# Patient Record
Sex: Male | Born: 1950 | Race: White | Hispanic: No | Marital: Married | Smoking: Never smoker
Health system: Southern US, Community
[De-identification: ages and names within clinical notes are randomized; demographics above are authoritative.]

## PROBLEM LIST (undated history)

## (undated) DIAGNOSIS — I428 Other cardiomyopathies: Secondary | ICD-10-CM

## (undated) DIAGNOSIS — I5042 Chronic combined systolic (congestive) and diastolic (congestive) heart failure: Secondary | ICD-10-CM

## (undated) DIAGNOSIS — E1140 Type 2 diabetes mellitus with diabetic neuropathy, unspecified: Secondary | ICD-10-CM

## (undated) DIAGNOSIS — I509 Heart failure, unspecified: Secondary | ICD-10-CM

## (undated) DIAGNOSIS — I10 Essential (primary) hypertension: Secondary | ICD-10-CM

## (undated) DIAGNOSIS — R42 Dizziness and giddiness: Secondary | ICD-10-CM

## (undated) HISTORY — DX: Dizziness and giddiness: R42

## (undated) HISTORY — DX: Other cardiomyopathies: I42.8

## (undated) HISTORY — PX: CORONARY ARTERY BYPASS GRAFT: SHX141

## (undated) HISTORY — DX: Chronic combined systolic (congestive) and diastolic (congestive) heart failure: I50.42

## (undated) HISTORY — DX: Type 2 diabetes mellitus with diabetic neuropathy, unspecified: E11.40

## (undated) HISTORY — PX: NM MYOCAR PERF EJECTION FRACTION: HXRAD630

---

## 1998-05-06 ENCOUNTER — Ambulatory Visit (HOSPITAL_COMMUNITY): Admission: RE | Admit: 1998-05-06 | Discharge: 1998-05-06 | Payer: Self-pay | Admitting: Gastroenterology

## 1998-12-30 ENCOUNTER — Inpatient Hospital Stay (HOSPITAL_COMMUNITY): Admission: AD | Admit: 1998-12-30 | Discharge: 1998-12-31 | Payer: Self-pay | Admitting: Pediatrics

## 1998-12-30 ENCOUNTER — Encounter: Payer: Self-pay | Admitting: Pediatrics

## 1998-12-31 ENCOUNTER — Encounter: Payer: Self-pay | Admitting: Cardiology

## 1998-12-31 HISTORY — PX: CARDIAC CATHETERIZATION: SHX172

## 1999-09-22 ENCOUNTER — Encounter: Payer: Self-pay | Admitting: Surgery

## 1999-09-22 ENCOUNTER — Encounter: Admission: RE | Admit: 1999-09-22 | Discharge: 1999-09-22 | Payer: Self-pay | Admitting: Surgery

## 1999-12-09 ENCOUNTER — Encounter: Admission: RE | Admit: 1999-12-09 | Discharge: 1999-12-09 | Payer: Self-pay | Admitting: Family Medicine

## 1999-12-09 ENCOUNTER — Encounter: Payer: Self-pay | Admitting: Family Medicine

## 2000-01-08 ENCOUNTER — Ambulatory Visit (HOSPITAL_COMMUNITY): Admission: RE | Admit: 2000-01-08 | Discharge: 2000-01-08 | Payer: Self-pay | Admitting: Gastroenterology

## 2000-03-14 ENCOUNTER — Encounter: Admission: RE | Admit: 2000-03-14 | Discharge: 2000-03-14 | Payer: Self-pay | Admitting: Otolaryngology

## 2000-03-14 ENCOUNTER — Encounter: Payer: Self-pay | Admitting: Otolaryngology

## 2002-10-19 ENCOUNTER — Encounter: Admission: RE | Admit: 2002-10-19 | Discharge: 2002-10-19 | Payer: Self-pay | Admitting: Family Medicine

## 2002-10-19 ENCOUNTER — Encounter: Payer: Self-pay | Admitting: Family Medicine

## 2003-02-18 ENCOUNTER — Ambulatory Visit (HOSPITAL_COMMUNITY): Admission: RE | Admit: 2003-02-18 | Discharge: 2003-02-18 | Payer: Self-pay | Admitting: Gastroenterology

## 2003-02-21 ENCOUNTER — Encounter: Admission: RE | Admit: 2003-02-21 | Discharge: 2003-02-21 | Payer: Self-pay | Admitting: Gastroenterology

## 2003-08-22 ENCOUNTER — Encounter: Payer: Self-pay | Admitting: Internal Medicine

## 2003-08-22 ENCOUNTER — Ambulatory Visit (HOSPITAL_COMMUNITY): Admission: RE | Admit: 2003-08-22 | Discharge: 2003-08-22 | Payer: Self-pay | Admitting: Cardiology

## 2004-06-19 ENCOUNTER — Encounter: Admission: RE | Admit: 2004-06-19 | Discharge: 2004-06-19 | Payer: Self-pay | Admitting: *Deleted

## 2004-06-25 ENCOUNTER — Ambulatory Visit (HOSPITAL_COMMUNITY): Admission: RE | Admit: 2004-06-25 | Discharge: 2004-06-26 | Payer: Self-pay | Admitting: *Deleted

## 2004-10-01 ENCOUNTER — Encounter: Admission: RE | Admit: 2004-10-01 | Discharge: 2004-10-01 | Payer: Self-pay | Admitting: Orthopedic Surgery

## 2007-04-13 ENCOUNTER — Encounter: Admission: RE | Admit: 2007-04-13 | Discharge: 2007-04-13 | Payer: Self-pay | Admitting: Family Medicine

## 2007-09-22 ENCOUNTER — Encounter: Admission: RE | Admit: 2007-09-22 | Discharge: 2007-09-22 | Payer: Self-pay | Admitting: Family Medicine

## 2007-09-22 ENCOUNTER — Encounter: Payer: Self-pay | Admitting: Pulmonary Disease

## 2007-10-25 ENCOUNTER — Ambulatory Visit: Payer: Self-pay | Admitting: Pulmonary Disease

## 2007-10-25 DIAGNOSIS — R05 Cough: Secondary | ICD-10-CM

## 2007-10-25 DIAGNOSIS — E785 Hyperlipidemia, unspecified: Secondary | ICD-10-CM | POA: Insufficient documentation

## 2007-11-15 ENCOUNTER — Telehealth (INDEPENDENT_AMBULATORY_CARE_PROVIDER_SITE_OTHER): Payer: Self-pay | Admitting: *Deleted

## 2007-11-15 ENCOUNTER — Ambulatory Visit: Payer: Self-pay | Admitting: Pulmonary Disease

## 2007-11-16 ENCOUNTER — Ambulatory Visit: Payer: Self-pay | Admitting: Internal Medicine

## 2007-11-20 ENCOUNTER — Telehealth (INDEPENDENT_AMBULATORY_CARE_PROVIDER_SITE_OTHER): Payer: Self-pay | Admitting: *Deleted

## 2007-12-12 ENCOUNTER — Telehealth: Payer: Self-pay | Admitting: Pulmonary Disease

## 2007-12-14 ENCOUNTER — Encounter: Payer: Self-pay | Admitting: Pulmonary Disease

## 2008-01-30 ENCOUNTER — Telehealth (INDEPENDENT_AMBULATORY_CARE_PROVIDER_SITE_OTHER): Payer: Self-pay | Admitting: *Deleted

## 2008-01-30 ENCOUNTER — Encounter: Payer: Self-pay | Admitting: Pulmonary Disease

## 2009-04-03 ENCOUNTER — Encounter: Payer: Self-pay | Admitting: Internal Medicine

## 2009-05-23 ENCOUNTER — Encounter: Payer: Self-pay | Admitting: Internal Medicine

## 2009-07-08 ENCOUNTER — Ambulatory Visit: Payer: Self-pay | Admitting: Internal Medicine

## 2009-07-09 ENCOUNTER — Ambulatory Visit: Payer: Self-pay | Admitting: Internal Medicine

## 2009-07-09 ENCOUNTER — Ambulatory Visit (HOSPITAL_COMMUNITY): Admission: RE | Admit: 2009-07-09 | Discharge: 2009-07-09 | Payer: Self-pay | Admitting: Internal Medicine

## 2009-07-11 ENCOUNTER — Telehealth: Payer: Self-pay | Admitting: Internal Medicine

## 2009-07-14 ENCOUNTER — Encounter: Payer: Self-pay | Admitting: Internal Medicine

## 2009-07-16 ENCOUNTER — Ambulatory Visit: Payer: Self-pay | Admitting: Internal Medicine

## 2009-07-16 DIAGNOSIS — I509 Heart failure, unspecified: Secondary | ICD-10-CM | POA: Insufficient documentation

## 2009-07-16 DIAGNOSIS — I428 Other cardiomyopathies: Secondary | ICD-10-CM | POA: Insufficient documentation

## 2009-07-21 LAB — CONVERTED CEMR LAB
BUN: 15 mg/dL (ref 6–23)
Basophils Absolute: 0 10*3/uL (ref 0.0–0.1)
CO2: 28 meq/L (ref 19–32)
Calcium: 8.9 mg/dL (ref 8.4–10.5)
Creatinine, Ser: 1.3 mg/dL (ref 0.4–1.5)
Eosinophils Absolute: 0.2 10*3/uL (ref 0.0–0.7)
Eosinophils Relative: 4.8 % (ref 0.0–5.0)
GFR calc non Af Amer: 59.85 mL/min (ref >60)
HCT: 43.3 % (ref 39.0–52.0)
INR: 1 (ref 0.8–1.0)
Lymphocytes Relative: 26.3 % (ref 12.0–46.0)
MCHC: 35.2 g/dL (ref 30.0–36.0)
MCV: 85.7 fL (ref 78.0–100.0)
Monocytes Relative: 7.5 % (ref 3.0–12.0)
Neutro Abs: 3 10*3/uL (ref 1.4–7.7)
Sodium: 140 meq/L (ref 135–145)
aPTT: 30.3 s — ABNORMAL HIGH (ref 21.7–28.8)

## 2009-07-23 ENCOUNTER — Ambulatory Visit: Payer: Self-pay | Admitting: Internal Medicine

## 2009-07-23 ENCOUNTER — Ambulatory Visit (HOSPITAL_COMMUNITY): Admission: RE | Admit: 2009-07-23 | Discharge: 2009-07-24 | Payer: Self-pay | Admitting: Internal Medicine

## 2009-07-24 ENCOUNTER — Encounter: Payer: Self-pay | Admitting: Internal Medicine

## 2009-08-06 ENCOUNTER — Ambulatory Visit: Payer: Self-pay

## 2009-08-06 ENCOUNTER — Encounter: Payer: Self-pay | Admitting: Internal Medicine

## 2009-10-02 ENCOUNTER — Ambulatory Visit: Payer: Self-pay | Admitting: Internal Medicine

## 2009-11-18 ENCOUNTER — Ambulatory Visit: Payer: Self-pay | Admitting: Internal Medicine

## 2009-11-18 DIAGNOSIS — R5383 Other fatigue: Secondary | ICD-10-CM

## 2009-11-18 DIAGNOSIS — R5381 Other malaise: Secondary | ICD-10-CM

## 2010-04-02 ENCOUNTER — Encounter (INDEPENDENT_AMBULATORY_CARE_PROVIDER_SITE_OTHER): Payer: Self-pay | Admitting: *Deleted

## 2010-04-16 ENCOUNTER — Encounter: Payer: Self-pay | Admitting: Pulmonary Disease

## 2010-05-05 NOTE — Letter (Signed)
Summary: Cath Report 2005  Cath Report 2005   Imported By: Roderic Ovens 07/24/2009 15:31:58  _____________________________________________________________________  External Attachment:    Type:   Image     Comment:   External Document

## 2010-05-05 NOTE — Assessment & Plan Note (Signed)
Summary: nep/biv-icd/generator change/jml   Referring Provider:  Donia Guiles Primary Provider:  Arvilla Market  CC:  nep/biv-icd- generator change.  History of Present Illness: Mr. Russell Fuentes is seen at the request of Dr. Alanda Amass for consideration of CRT he generator replacement.  The patient is a just about 60 year old gentleman with a history of nonischemic heart disease by catheterization in 2005 and a negative recent Myoview had congestive heart failure. He underwent CRT-D implantation in 2006at that time received a 6948 Sprint fidelis lead. He has had no subsequent ICD discharges.  There has been suffering normalization of his left ventricular function. Recent ejection fractions are in the 50-55% range. Not withstanding this he had some problems with exercise intolerance but no nocturnal dyspnea orthopnea or peripheral edema.  He has had no ICD discharges.  The concern about a heart attack about 30 years ago. He developed at that time left bundle branch block associated temporally with prolonged chest pain. This was not confirmed at catheterization. Myoview scanning demonstrated normal perfusion  Current Medications (verified): 1)  Coreg 25 Mg  Tabs (Carvedilol) .[name].. 1 Tab Two Times A Day 2)  Diovan 160 Mg  Tabs (Valsartan) .... Take 1 Tablet By Mouth Once A Day 3)  Spironolactone 25 Mg Tabs (Spironolactone) .... 1/2 Tab Daily 4)  Klor-Con M10 10 Meq  Cr-Tabs (Potassium Chloride Crys Cr) .... Pt Not Taking 5)  Glimepiride 4 Mg  Tabs (Glimepiride) .... Once Daily 6)  Lovastatin 20 Mg Tabs (Lovastatin) .... Take One Tablet Once Daily  Allergies (verified): 1)  ! Codeine  Past History:  Past Medical History: Last updated: 07/07/2009 Diabetes Hyperlipidemia Heart disease of unknown type status post permanent pacemaker-Medtronic model number 4193  Past Surgical History: Last updated: 07/07/2009 Medtronic model number 4193  Family History: Last updated:  10/25/2007 asthma-sisters, brothers heart disease-father,sister cancer-mother-colon, sister-lung family has diabetes  Social History: Last updated: 10/25/2007 works as Designer, industrial/product lives with wife and dog  Vital Signs:  Patient profile:   60 year old male Height:      71 inches Weight:      276 pounds BMI:     38.63 Pulse rate:   77 / minute Pulse rhythm:   regular BP sitting:   136 / 80  (right arm) Cuff size:   large  Vitals Entered By: Judithe Modest CMA (July 08, 2009 11:19 AM)  Physical Exam  General:  Well developed, well nourished,occasion male appearing his stated age in no acute distress.moderately obese Head:  normal HEENT Neck:  supple without adenopathy JVP or carotid bruits Lungs:  clear Heart:  regular rate and rhythm without murmurs or gallops Abdomen:  soft protuberant active bowel sounds positive HJR Msk:  Back normal, normal gait. Muscle strength and tone normal. Pulses:  intact distal pulses Extremities:  No clubbing or cyanosis. Neurologic:  Alert and oriented x 3. Skin:  Intact without lesions or rashes. Cervical Nodes:  no adenopathy Psych:  anxious   EKG  Procedure date:  07/08/2009  Findings:      P synchronouss biventricular pacing  Impression & Recommendations:  Problem # 1:  IMPLANTATION OF DEFIBRILLATOR- MEDTRONIC MODEL NUMBER 4193 (ICD-V45.02) the patient has a previously implanted CRT-D which a achieved ERI December 2010. He is referred for consideration of device generator replacement. The system is in conjunction with a 6948-lead. Her graft with a lengthy discussion regarding options which relates to the presence or absence of venous occlusion on the ipsilateral side. In the event that it is  occluded, options would be extraction of the l or tunneling of a contralaterally inserted defibrillator lead. The knot of he will be necessary to determine whether this is necessary. We discussed potential benefits as well as potential risks and  consultations of procedure including infection lead fracture in the standing is risks and are willing to proceed.  Outpatient records were reviewed demonstrating a nonischemic Myoview and normalization of left ventricular function.  Patient Instructions: 1)  You are scheduled for a venogram in the morning (July 09, 2009).  Be at the short stay center at [facility] at 6:30AM.  Nothing to eat or drink after midnight tonight.

## 2010-05-05 NOTE — Miscellaneous (Signed)
Summary: Device change out  Clinical Lists Changes  Observations: Added new observation of ICD INDICATN: NICM/CHF (07/24/2009 13:29) Added new observation of ICDLEADSTAT3: active (07/24/2009 13:29) Added new observation of ICDLEADSER3: [id number] V (07/24/2009 13:29) Added new observation of ICDLEADMOD3: 4193  (07/24/2009 13:29) Added new observation of ICDLEADDOI3: 06/25/2004  (07/24/2009 13:29) Added new observation of ICDLEADLOC3: LV  (07/24/2009 13:29) Added new observation of ICDLEADSTAT2: active  (07/24/2009 13:29) Added new observation of ICDLEADSER2: [id number]  (07/24/2009 13:29) Added new observation of ICDLEADMOD2: 7121  (07/24/2009 13:29) Added new observation of ICDLEADDOI2: 07/23/2009  (07/24/2009 13:29) Added new observation of ICDLEADLOC2: RV  (07/24/2009 13:29) Added new observation of ICDLEADSTAT1: active  (07/24/2009 13:29) Added new observation of ICDLEADSER1: [id number]  (07/24/2009 13:29) Added new observation of ICDLEADMOD1: 5076  (07/24/2009 13:29) Added new observation of ICDLEADDOI1: 06/25/2004  (07/24/2009 13:29) Added new observation of ICDLEADLOC1: RA  (07/24/2009 13:29) Added new observation of ICD IMP MD: Sherryl Manges, MD  (07/24/2009 13:29) Added new observation of ICD IMPL DTE: 07/23/2009  (07/24/2009 13:29) Added new observation of ICD SERL#: [id number] H  (07/24/2009 13:29) Added new observation of ICD MODL#: [id number]  (07/24/2009 13:29) Added new observation of ICDMANUFACTR: Medtronic  (07/24/2009 13:29) Added new observation of IDC REFER MD: Hazeline Junker  (07/24/2009 13:29) Added new observation of ICD MD: Sherryl Manges, MD  (07/24/2009 13:29)       ICD Specifications Following MD:  Sherryl Manges, MD     Referring MD:  Hazeline Junker ICD Vendor:  Medtronic     ICD Model Number:  [id number]     ICD Serial Number:  [id number] ICD DOI:  07/23/2009     ICD Implanting MD:  Sherryl Manges, MD  Lead 1:    Location: RA     DOI: 06/25/2004     Model  #: 2595     Serial #: [id number]     Status: active Lead 2:    Location: RV     DOI: 07/23/2009     Model #: 9518     Serial #: [id number]     Status: active Lead 3:    Location: LV     DOI: 06/25/2004     Model #: 0160     Serial #: [id number]     Status: active  Indications::  NICM/CHF

## 2010-05-05 NOTE — Letter (Signed)
Summary: [facility] Office Note  [facility] Office Note   Imported By: Roderic Ovens 07/24/2009 15:35:26  _____________________________________________________________________  External Attachment:    Type:   Image     Comment:   External Document

## 2010-05-05 NOTE — Cardiovascular Report (Signed)
Summary: Pre Op Orders  Pre Op Orders   Imported By: Roderic Ovens 07/24/2009 11:26:11  _____________________________________________________________________  External Attachment:    Type:   Image     Comment:   External Document

## 2010-05-05 NOTE — Cardiovascular Report (Signed)
Summary: Office Visit   Office Visit   Imported By: Roderic Ovens 08/18/2009 16:41:34  _____________________________________________________________________  External Attachment:    Type:   Image     Comment:   External Document

## 2010-05-05 NOTE — Progress Notes (Signed)
Summary: req call back / surgery 4/20  Phone Note Call from Patient Call back at Home Phone [phone]   Caller: Patient Reason for Call: Talk to Nurse Summary of Call: request call back from nurse, please call before 1:30 Initial call taken by: Migdalia Dk,  July 11, 2009 9:46 AM  Follow-up for Phone Call        Spoke with pt. Pt states he had a Venogram on april 6th /11. Patient states per Dr. Marikay Alar:  Joice Lofts was to call him to comfirm he was scheduled for device generator replacement on wednesday April 20th at 6:30 AM. Patient would like to know asap so he can let his supervisor know. I let pt. know will send message to MD and his nurse. Ollen Gross, RN, BSN  July 11, 2009 12:35 PM  Per pt calling back to check on status sugery 4/20. [phone] Lorne Skeens  July 14, 2009 12:13 PM    Additional Follow-up for Phone Call Additional follow up Details #1::        Called patient and left message on machine [employer] RN BSN  July 14, 2009 1:36 PM  Spoke with wife, instructions reviewed. Gypsy Balsam RN BSN  July 14, 2009 4:33 PM

## 2010-05-05 NOTE — Procedures (Signed)
Summary: [facility]/ GD   Current Medications (verified): 1)  Coreg 25 Mg  Tabs (Carvedilol) .[name].. 1 Tab Two Times A Day 2)  Diovan 160 Mg  Tabs (Valsartan) .... Take 1 Tablet By Mouth Once A Day 3)  Spironolactone 25 Mg Tabs (Spironolactone) .... 1/2 Tab Daily 4)  Klor-Con M10 10 Meq  Cr-Tabs (Potassium Chloride Crys Cr) .... Pt Not Taking 5)  Glimepiride 4 Mg  Tabs (Glimepiride) .... Once Daily 6)  Lovastatin 20 Mg Tabs (Lovastatin) .... Take One Tablet Once Daily  Allergies (verified): 1)  ! Codeine   ICD Specifications Following MD:  Sherryl Manges, MD     Referring MD:  Hazeline Junker ICD Vendor:  Medtronic     ICD Model Number:  [id number]     ICD Serial Number:  [id number] ICD DOI:  07/23/2009     ICD Implanting MD:  Sherryl Manges, MD  Lead 1:    Location: RA     DOI: 06/25/2004     Model #: 6213     Serial #: [id number]     Status: active Lead 2:    Location: RV     DOI: 07/23/2009     Model #: 2952     Serial #: [id number]     Status: active Lead 3:    Location: LV     DOI: 06/25/2004     Model #: 1027     Serial #: [id number]     Status: active  Indications::  NICM/CHF   ICD Follow Up Remote Check?  No Battery Voltage:  3.22 V     Charge Time:  8.5 seconds     ICD Dependent:  No       ICD Device Measurements Atrium:  Amplitude: 3.8 mV, Impedance: 551 ohms, Threshold: 0.5 V at 0.4 msec Right Ventricle:  Amplitude: 20 mV, Impedance: 551 ohms, Threshold: 0.75 V at 0.4 msec Left Ventricle:  Impedance: 703 ohms, Threshold: 0.875 V at 0.4 msec Shock Impedance: 51/65 ohms   Episodes MS Episodes:  0     Percent Mode Switch:  0     Coumadin:  No Shock:  0     ATP:  0     Nonsustained:  0     Atrial Pacing:  29.4%     Ventricular Pacing:  99.9%  Brady Parameters Mode DDD     Lower Rate Limit:  70     Upper Rate Limit 120 PAV 130     Sensed AV Delay:  100  Tachy Zones VF:  188     Next Cardiology Appt Due:  11/03/2009 Tech Comments:  Steri strips removed. No redness or edema.   Activity restrictions reviewed with the patient along with instructions for icd discharge.  No parameter changes.  Device function normal.  ROV 3 months with Dr. Graciela Husbands. Altha Harm, LPN  Aug 06, 4740 12:45 PM    Patient Instructions: 1)  Your physician recommends that you continue on your current medications as directed. Please refer to the Current Medication list given to you today. Return office visit in 3 months.

## 2010-05-05 NOTE — Cardiovascular Report (Signed)
Summary: Office Visit   Office Visit   Imported By: Roderic Ovens 11/19/2009 11:59:50  _____________________________________________________________________  External Attachment:    Type:   Image     Comment:   External Document

## 2010-05-05 NOTE — Cardiovascular Report (Signed)
Summary: Office Visit   Office Visit   Imported By: Roderic Ovens 10/29/2009 16:12:35  _____________________________________________________________________  External Attachment:    Type:   Image     Comment:   External Document

## 2010-05-05 NOTE — Assessment & Plan Note (Signed)
Summary: icd went off per pt   Current Medications (verified): 1)  Coreg 25 Mg  Tabs (Carvedilol) .[name].. 1 Tab Two Times A Day 2)  Diovan 160 Mg  Tabs (Valsartan) .... Take 1 Tablet By Mouth Once A Day 3)  Spironolactone 25 Mg Tabs (Spironolactone) .... 1/2 Tab Daily 4)  Klor-Con M10 10 Meq  Cr-Tabs (Potassium Chloride Crys Cr) .... Pt Not Taking 5)  Glimepiride 4 Mg  Tabs (Glimepiride) .... Once Daily 6)  Lovastatin 20 Mg Tabs (Lovastatin) .... Take One Tablet Once Daily  Allergies (verified): 1)  ! Codeine    ICD Specifications Following MD:  Sherryl Manges, MD     Referring MD:  Hazeline Junker ICD Vendor:  Medtronic     ICD Model Number:  [id number]     ICD Serial Number:  [id number] ICD DOI:  07/23/2009     ICD Implanting MD:  Sherryl Manges, MD  Lead 1:    Location: RA     DOI: 06/25/2004     Model #: 5076     Serial #: [id number]     Status: active Lead 2:    Location: RV     DOI: 07/23/2009     Model #: 4132     Serial #: [id number]     Status: active Lead 3:    Location: LV     DOI: 06/25/2004     Model #: 5366     Serial #: [id number]     Status: active  Indications::  NICM/CHF   ICD Follow Up Battery Voltage:  3.21 V     Charge Time:  8.5 seconds     ICD Dependent:  No       ICD Device Measurements Atrium:  Impedance: 494 ohms,  Right Ventricle:  Impedance: 627 ohms,  Left Ventricle:  Impedance: 627 ohms,  Shock Impedance: 51/66 ohms   Episodes MS Episodes:  0     Percent Mode Switch:  0     Coumadin:  No Shock:  0     ATP:  0     Nonsustained:  1     Atrial Therapies:  0 Atrial Pacing:  32%     Ventricular Pacing:  32.0%  Brady Parameters Mode DDD     Lower Rate Limit:  70     Upper Rate Limit 120 PAV 130     Sensed AV Delay:  100  Tachy Zones VF:  188     Tech Comments:  Pt woke up Monday morning around 3am with a shock feeling.  Pt thought icd fired. 1 NST episode lasting 2 seconds on 09-05-09.  No changes made.  ROV 11-18-09 @ 920 w/SK. Vella Kohler  October 02, 2009 11:37 AM

## 2010-05-05 NOTE — Assessment & Plan Note (Signed)
Summary: device/saf   Referring Provider:  Donia Guiles Primary Provider:  Arvilla Market   History of Present Illness: Russell Fuentes is seen in followup for CRT D generator replacement.  He underwent CRT-D implantation in 2006at that time received a 6948 Sprint fidelis lead. He has had no subsequent ICD discharges.  He underwent a generator replacement and a new defibrillator lead was implanted at the same time because of the risk of fracture of the patella system.  He has a  history of nonischemic heart disease by catheterization in 2005 and a negative recent Myoview had congestive heart failure.  There has been subsequent normalization  of his left ventricular function. Recent ejection fractions are in the 50-55% range.    He has had no ICD discharges.  The patient is very frustrated about the lack of followup with Dr. Kandis Cocking office not calling afterwards.  I told him that I have never done so, but maybe i should  Current Medications (verified): 1)  Coreg 25 Mg  Tabs (Carvedilol) .[name].. 1 Tab Two Times A Day 2)  Diovan 160 Mg  Tabs (Valsartan) .... Take 1 Tablet By Mouth Once A Day 3)  Spironolactone 25 Mg Tabs (Spironolactone) .... 1/2 Tab Daily 4)  Klor-Con M10 10 Meq  Cr-Tabs (Potassium Chloride Crys Cr) .... As Needed 5)  Glimepiride 4 Mg  Tabs (Glimepiride) .... Once Daily 6)  Lovastatin 20 Mg Tabs (Lovastatin) .... Take One Tablet Once Daily  Allergies (verified): 1)  ! Codeine  Past History:  Past Medical History: Last updated: 07/10/2009 Diabetes Hyperlipidemia Heart disease of unknown type status post permanent pacemaker-Medtronic model number 4193 MI 1984  Vital Signs:  Patient profile:   60 year old male Height:      71 inches Weight:      272 pounds BMI:     38.07 Pulse rate:   87 / minute Resp:     18 per minute BP sitting:   140 / 84  (right arm)  Vitals Entered By: Marrion Coy, CNA (November 18, 2009 9:19 AM)  Physical Exam  General:  The patient  was alert and oriented in no acute distress. HEENT Normal.  Neck veins were flat, carotids were brisk.  Lungs were clear.  Heart sounds were regular without murmurs or gallops.  Abdomen was soft with active bowel sounds. There is no clubbing cyanosis or edema. Skin Warm and dry     ICD Specifications Following MD:  Sherryl Manges, MD     Referring MD:  Hazeline Junker ICD Vendor:  Medtronic     ICD Model Number:  [phone]     ICD Serial Number:  [id number] ICD DOI:  07/23/2009     ICD Implanting MD:  Sherryl Manges, MD  Lead 1:    Location: RA     DOI: 06/25/2004     Model #: 7846     Serial #: [id number]     Status: active Lead 2:    Location: RV     DOI: 07/23/2009     Model #: 2440     Serial #: [id number]     Status: active Lead 3:    Location: LV     DOI: 06/25/2004     Model #: 6440     Serial #: [id number]     Status: active  Indications::  NICM/CHF   ICD Follow Up Battery Voltage:  3.20 V     Charge Time:  8.5 seconds     Underlying rhythm:  SR ICD Dependent:  No       ICD Device Measurements Atrium:  Amplitude: 4.8 mV, Impedance: 532 ohms, Threshold: 0.50 V at 0.40 msec Right Ventricle:  Amplitude: >20 mV, Impedance: 551 ohms, Threshold: 1.25 V at 0.40 msec Left Ventricle:  Impedance: 532 ohms, Threshold: 1.00 V at 0.40 msec Shock Impedance: 53/72 ohms   Episodes MS Episodes:  0     Coumadin:  No Shock:  0     ATP:  0     Nonsustained:  0     Atrial Therapies:  0 Atrial Pacing:  35%     Ventricular Pacing:  99.9%  Brady Parameters Mode DDD     Lower Rate Limit:  70     Upper Rate Limit 120 PAV 130     Sensed AV Delay:  100  Tachy Zones VF:  188     Next Cardiology Appt Due:  02/03/2010 Tech Comments:  NORMAL DEVICE FUNCTION.  NO EPISODES SINCE LAST CHECK.  CHANGED RA OUTPUT TO 2.0 RV OUTPUT TO 2.50 V.  CHANGED MAX LEAD IMPEDANCES FOR LIA.  TURNED ON 1:1 SVT.  PT WANTS TO THINK ABOUT CARELINK--WILL MAKE DECISION AT NEXT OFC CHECK.  ROV IN 3 MTHS W/CLINIC. Vella Kohler  November 18, 2009 9:30 AM  Impression & Recommendations:  Problem # 1:  [id number] LEAD (ICD-996.04) The patient's sprint fidelis lead was capped and the new ICD lead implant  Problem # 2:  CRT DEFIBRILLATOR- MEDTRONIC (ICD-V45.02) Device parameters and data were reviewed and no changes were made note the above system revision  Problem # 3:  CHF (ICD-428.0) stable His updated medication list for this problem includes:    Coreg 25 Mg Tabs (Carvedilol) .[name]... 1 tab two times a day    Diovan 160 Mg Tabs (Valsartan) .[name]... Take 1 tablet by mouth once a day    Spironolactone 25 Mg Tabs (Spironolactone) .[name]... 1/2 tab daily  Problem # 4:  OTHER PRIMARY CARDIOMYOPATHIES (ICD-425.4) continue current medications His updated medication list for this problem includes:    Coreg 25 Mg Tabs (Carvedilol) .[name]... 1 tab two times a day    Diovan 160 Mg Tabs (Valsartan) .[name]... Take 1 tablet by mouth once a day    Spironolactone 25 Mg Tabs (Spironolactone) .[name]... 1/2 tab daily  Problem # 5:  FATIGUE (ICD-780.79) the patient has disordered eating at night suggestive of obstructive sleep apnea. I think he should undergo night watch testing, but will defer this to his primary cardiologist. In the event that the patient returns for care here we will pursue this  Patient Instructions: 1)  Your physician recommends that you schedule a follow-up appointment in: 3 MOMNTHS WITH KRSITIN AND PAULA 2)  Your physician recommends that you continue on your current medications as directed. Please refer to the Current Medication list given to you today.

## 2010-05-05 NOTE — Letter (Signed)
Summary: Implantable Device Instructions  Architectural technologist, Main Office  [address]   [place], [place] [address]   Phone: [phone]  Fax: [phone]      Implantable Device Instructions  You are scheduled for:  _____ Permanent Transvenous Pacemaker _____ Implantable Cardioverter Defibrillator _____ Implantable Loop Recorder __X___ Generator Change with new RV lead  on [facility] July 23, 2009 with Dr. Graciela Husbands.  1.  Please arrive at the Short Stay Center at [facility] at 6:30 AM on the day of your procedure.  2.  Do not eat or drink the night before your procedure.  3.  Complete lab work on 07-16-09.  The lab at [facility] is open from 8:30 AM to 1:30 PM and from 2:30 PM to 5:00 PM.  The lab at [facility] is open from 7:30 AM to 5:30 PM.  You do not have to be fasting.  4.  Do NOT take these medications morning of procedure: Glimiperide.   5.  Plan for an overnight stay.  Bring your insurance cards and a list of your medications.  6.  Wash your chest and neck with antibacterial soap (any brand) the evening before and the morning of your procedure.  Rinse well.   *If you have ANY questions after you get home, please call the office [phone].  *Every attempt is made to prevent procedures from being rescheduled.  Due to the nauture of Electrophysiology, rescheduling can happen.  The physician is always aware and directs the staff when this occurs.

## 2010-05-07 NOTE — Consult Note (Signed)
Summary: [facility]  [facility]   Imported By: Sherian Rein 04/28/2010 12:43:47  _____________________________________________________________________  External Attachment:    Type:   Image     Comment:   External Document

## 2010-05-07 NOTE — Letter (Signed)
Summary: Device-Delinquent Check  [facility], Main Office  [address]   [place], [place] [address]   Phone: [phone]  Fax: [phone]     April 02, 2010 MRN: [id number]   Russell Fuentes [address] [place], [place]  [address]   Dear Mr. BURLEY,  According to our records, you have not had your implanted device checked in the recommended period of time.  We are unable to determine appropriate device function without checking your device on a regular basis.  Please call our office to schedule an appointment, with Device Clinic, as soon as possible.  If you are having your device checked by another physician, please call us so that we may update our records.  Thank you,  Letta Moynahan, EMT  April 02, 2010 12:04 PM  [facility]

## 2010-05-08 NOTE — Cardiovascular Report (Signed)
Summary: [facility] Defibrillator Interrogation Form  [facility] Defibrillator Interrogation Form   Imported By: Roderic Ovens 07/24/2009 15:34:17  _____________________________________________________________________  External Attachment:    Type:   Image     Comment:   External Document

## 2010-06-23 LAB — GLUCOSE, CAPILLARY
Glucose-Capillary: 138 mg/dL — ABNORMAL HIGH (ref 70–99)
Glucose-Capillary: 172 mg/dL — ABNORMAL HIGH (ref 70–99)
Glucose-Capillary: 176 mg/dL — ABNORMAL HIGH (ref 70–99)

## 2010-08-21 NOTE — Cardiovascular Report (Signed)
Russell Fuentes, Russell Fuentes              ACCOUNT NO.:  [id number]   MEDICAL RECORD NO.:  [id number]          PATIENT TYPE:  OIB   LOCATION:  6531                         FACILITY:  [facility]   PHYSICIAN:  Mark E. Peele, M.D.    DATE OF BIRTH:  08-06-1950   DATE OF PROCEDURE:  06/25/2004  DATE OF DISCHARGE:                              CARDIAC CATHETERIZATION   PROCEDURES PERFORMED:  ICD implantation dual chamber, left axillary  venogram, fluoroscopy for implantation, coronary sinus venography,  implantation of LV pacing lead via coronary vein, lead and ICD testing.   PREPROCEDURE DIAGNOSES:  1.  Class III congestive heart failure.  2.  Nonischemic cardiomyopathy.  3.  Severe ejection fraction than 35%.  4.  Dilated left ventricle.  5.  Left bundle branch block, QRS duration greater than 130 msec.   POSTPROCEDURE DIAGNOSES:  1.  Class III congestive heart failure.  2.  Nonischemic cardiomyopathy.  3.  Severe ejection fraction than 35%.  4.  Dilated left ventricle.  5.  Left bundle branch block, QRS duration greater than 130 msec.   OPERATOR:  Mark E. Severiano Gilbert, M.D.   COMPLICATIONS:  None.   ESTIMATED BLOOD LOSS:  Less than 50 mL.   PROCEDURE IN DETAIL:  The patient was brought to the EP laboratory in fasted  state.  The patient's left pectoral region was prepped and draped in usual  manner.  Ancef 1 g IV immediately prior to skin incision for antimicrobial  prophylaxis.  Subcutaneous lidocaine injections of 1% lidocaine for  anesthesia, conscious sedation with intermittent injections of Midazolam and  Fentanyl.  A 6-cm incision line 2 cm inferior to the left clavicle and  reaching the deltopectoral groove was made to the level of the prepectoral  fascia using a combination of sharp and blunt dissection.  Inferior to the  incision line along the prepectoral fascia, an appropriately sized ICD  pocket was fashioned.  Hemostasis was obtained by electrocautery.  The  pocket was copiously  irrigated with antibiotic solution.  Next, a left  axillary venogram was performed which demonstrated a moderate sized  relatively linear axillary vein.  Using a thin wall needle technique in an  extrathoracic manner along the first rib, the axillary vein was entered  three times.  Two 7-French and a single 9.5-French sheath were introduced  without difficulty.  A Medtronic model number [id number] active fixation atrial  lead was advanced under fluoroscopic guidance into the right atrial  appendage actively fixed, and after determining adequate electronic  characteristics the safe sheath was removed and the lead was sutured to the  prepectoral fascia using 0 silk ligatures.  Next, a Medtronic model number  [id number] passive fixation ICD lead was advanced fluoroscopically first into the  right ventricular outflow tract and then positioned within the right  ventricular apex without difficulty.  After determining adequate electronic  characteristics, the safe sheath was removed and the lead was sutured in  place using 0 silk ligatures.  Finally, via the 9.5-French safe sheath a  long Medtronic curved CS cannulation sheath was advanced to the level of the  right  atrium.  Under fluoroscopic guidance, the sheath was easily advanced  into the os of the coronary sinus.  Using a balloon tip catheter, a balloon  occlusive coronary sinus venogram was performed in multiple projections.  The patient had a modest sized middle vein with a small posterior lateral  branch arising off the mid course of the middle vein.  He had a modest size  very stenotic and tortuous lateral vein, and he had large anterior vein  complex with a large branching anterolateral vein which appeared to be the  most suitable target.  Using a floppy angioplasty wire, the lateral vein was  attempted to be cannulated without success, and an angioplasty wire was used  to interrogate the great cardiac vein area.  It was relatively easy to enter   the anterolateral branch which was of extensive course all the way down to  the distal one-third of the LV and as it coursed along this became more and  more lateral.  Over the angioplasty wire, a Medtronic model number [id number]  unipolar LV pacing lead was advanced without difficulty into the distal one-  third, middle one-third of the LV in the RAO projection.  The angioplasty  wire was easily removed and after determining adequate electronic  characteristics this was selected as the pacing site for the LV.  Under  fluoroscopic guidance, the Medtronic CS sheath was removed without  difficulty in a cut-away fashion. The LV lead was then sutured in place to  the prepectoral fascia using 0 silk ligatures.  The atrial, ventricular, and  LV leads were then connected to a Medtronic model number 7304 biventricular  ICD serial number [id number] which was then placed within the preformed ICD  pocket.  The pocket was closed in layers of 2-0 and 4-0 Vicryl.  The skin  incision was reinforced with Steri-Strips.  A single ICD shock was performed  for safety margin testing and after completion of this the patient was  returned to the holding area in stable hemodynamic condition.   Electronic characteristics of the atrial lead demonstrated through the  stimulator, capture threshold 0.4 V at 0.5 msec, impedance 1285 ohms,  measured P wave 2.8.  The RV lead demonstrated a capture threshold of 0.7 V  at 0.5 msec, impedance 688 ohms, and a measured R wave of 12.  The LV lead  demonstrated a capture threshold of 0.9 V at 0.5 msec, impedance 1000 ohms,  and a measured R wave of 24 mV.  Similar values were obtained through the  device after connection with all acceptable impedances.  High energy shock  impedance was noted to be approximately 50 ohms.  Of note, the LV threshold  through the device was between 1 and 1.5 V at 0.5 msec.  There was absence of diaphragmatic pacing from all pacing sites at maximum   output.  A single episode of ventricular fibrillation was easily induced  with T wave shock protocol device in a sensitive setting adequately with  minimal dropout, recognized the arrhythmia and delivered a single 20-joule  shock which resulted in resumption of a perfusing rhythm.  This correlates  to a safety margin of greater than 10 joules.  The device was programmed  into a dual chamber pacemaker mode with AV delay of 110-130, output 5 V, 0.5  msec in all chambers.  This was to ensure biventricular pacing.  A single  zone tachy therapy for VF greater than 200 beats per minute was programmed  on.  The patient will receive initial 30 joule followed by a 35-joule shock.  35 joules is the maximum output of the device.      MEP/MEDQ  D:  06/25/2004  T:  06/25/2004  Job:  [id number]

## 2010-08-21 NOTE — Op Note (Signed)
   NAME:  Russell Fuentes, Russell Fuentes                        ACCOUNT NO.:  [id number]   MEDICAL RECORD NO.:  [id number]                   PATIENT TYPE:  AMB   LOCATION:  ENDO                                 FACILITY:  [facility]   PHYSICIAN:  James L. Malon Kindle., M.D.          DATE OF BIRTH:  Apr 29, 1950   DATE OF PROCEDURE:  02/18/2003  DATE OF DISCHARGE:                                 OPERATIVE REPORT   PROCEDURE:  Colonoscopy.   MEDICATIONS:  1. Fentanyl 50 mcg.  2. Versed 6 mg IV.   INDICATION:  This is done as a follow-up for a history of previous  adenomatous colon polyps.   DESCRIPTION OF PROCEDURE:  The procedure had been explained to the patient  and consent obtained.  With the patient in the left lateral decubitus  position, the adult Olympus scope inserted and advanced.  The prep was  excellent.  Using abdominal pressure and position changes, we were able to  advance through to the cecum.  The ileocecal valve was seen and was  __________.  The scope was withdrawn.  The cecum, ascending colon,  transverse colon, descending and sigmoid colon were seen well.  No polyps  were seen throughout the entire colon.  There was diverticulosis within the  sigmoid colon.  The scope was withdrawn.  The patient tolerated the  procedure well.   ASSESSMENT:  1. History of colon polyps with negative colonoscopy, code V12.72.  2. Diverticulosis, code 562.10.   PLAN:  We will recommend five year repeat colonoscopy and yearly Hemoccults.                                               James L. Malon Kindle., M.D.    Waldron Session  D:  02/18/2003  T:  02/18/2003  Job:  [id number]   cc:   Donia Guiles, M.D.  [address] [place]  [place] [address]  Fax: [phone]

## 2010-08-21 NOTE — Cardiovascular Report (Signed)
NAME:  Russell Fuentes, Russell Fuentes                        ACCOUNT NO.:  [id number]   MEDICAL RECORD NO.:  [id number]                   PATIENT TYPE:  OIB   LOCATION:  2899                                 FACILITY:  [facility]   PHYSICIAN:  [name], M.D.             DATE OF BIRTH:  Mar 29, 1951   DATE OF PROCEDURE:  08/22/2003  DATE OF DISCHARGE:  08/22/2003                              CARDIAC CATHETERIZATION   PROCEDURE PERFORMED:  1. Combine right and left heart cardiac catheterization.  2. Thermal dilution and Fick cardiac output determinations.   COMPLICATIONS:  None.   ENTRY SITE:  Right femoral.   DYE USED:  Omnipaque.   PATIENT PROFILE:  The patient is now a 60 year old white married gentleman  with known congestive cardiomyopathy dating back for at least 10 years. He  has had previous cardiac catheterizations; in 1984 by Dr. Aram Candela. Tysinger  and by Dr. Tresa Endo in September 2004; and had no evidence of coronary disease,  but he has had an ejection fractions measured at approximately 25-30% on  those occasions.  Recently the patient has had chest tightness and shortness  of breath, functional class 2, with exertion like bailing hay or digging in  his yard.   MEDICATIONS:  His medications include Lotensin 10 mg a day and Toprol XL 25  mg a day.  The patient is fairly noncompliant with medications and wants to  be on small doses of cheaper medicine.   Today the patient comes to the [facility] electively for  cardiac catheterization following an abnormal echocardiogram showing low EF.   RESULTS PRESSURES:  1. The left ventricular pressure was 125/5; end-diastolic pressure 17.  2. Central aortic pressure 125/80, mean of 101.  3. Right atrial mean pressure 11.  4. Right ventricular pressure 32/8; end-diastolic pressure 10.  5. Pulmonary artery pressure 30/17, mean of 22.  6. Pulmonary capillary wedge mean pressure is 17.  7. Pulmonary artery saturation 66%.  8. Central aorta saturation 95%.  9. Thermal dilution cardiac output 6.7.  10.      Thermal dilution cardiac index 2.9.  11.      Fick cardiac output 4.5.  12.      Fick cardiac index 1.9.  13.      Heart rate 74, sinus rhythm.   ANGIOGRAPHIC RESULTS:  1. The coronary arteries are normal.  Right coronary artery is large and     dominant.  Circumflex gives rise to 1 obtuse marginal branch and is     nondominant. Large intermediate ramus branch which trifurcates distally.     LAD normal with 1 diagonal branch and 1 septal perforator branch.  2. The left ventricle is dilated, globally hypokinetic.  Ejection fraction     is between 20 and 25%.  No mitral regurgitation demonstrated.   FINAL DIAGNOSES:  1. Dilated, nonischemic cardiomyopathy.  2. Left ventricular ejection fraction 20-25%.  3. Angiographic  patent coronary arteries with right coronary dominant     system.  4. Normal right heart hemodynamics.  5. Mild elevation of pulmonary capillary wedge, mean and LV end-diastolic     pressures.  6. Cardiac index by Fick and thermal appear to be somewhere between 2.0 and     2.5 which is acceptable for the patient at baseline activities with     exertion; however, he becomes functional class 2.   PLAN:  For this patient I will recommend addition of digoxin to his regimen  and I would like to see him on at least 10 mg of Altace and ultimately 50 mg  of Coreg a day.  He has indicated to me his willingness to switch from his  current beta blocker choices which are low-dose Lotensin and low-dose  metoprolol.                                               [name], M.D.    WHG/MEDQ  D:  08/22/2003  T:  08/23/2003  Job:  [id number]   cc:   [name], M.D.  [address]  [place]  [place] [address]  Fax: [phone]   [facility]   Donia Guiles, M.D.  [address] [place]  [place] [address]  Fax: [phone]

## 2010-08-21 NOTE — Procedures (Signed)
[facility]  Patient:    Russell Fuentes, Russell Fuentes                     MRN: [id number] Proc. Date: 01/08/00 Adm. Date:  [id number] Attending:  Orland Mustard                           Procedure Report  PROCEDURE PERFORMED:  Colonoscopy.  ENDOSCOPIST:  Llana Aliment. Edwards, M.D.  MEDICATIONS:  Fentanyl 62.5 mcg and Versed 6 mg IV.  SCOPE:  Pediatric Olympus video colonoscope.  INDICATIONS:  History of adenomatous colon polyps.  DESCRIPTION OF PROCEDURE:  The procedure was explained to the patient and consent obtained.  With the patient in the left lateral decubitus position, a digital exam was performed.  The pediatric Olympus video colonoscope was inserted and advanced under direct visualization.  The prep was excellent and readily advanced to the cecum without difficulty.  Using abdominal pressure and position change, the ileocecal valve was seen and for a short distance it was normal.  The appendiceal orifices were normal.  The scope was withdrawn. The cecum, ascending colon, hepatic flexure, transverse colon, splenic flexure, descending and sigmoid colon were seen upon withdrawn and no polyps or other lesions were seen.  There was no significant diverticular disease. The scope was withdrawn to the rectum and the rectum was also normal.  The patient tolerated the procedure well.  He was maintained on low flow oxygen and pulse oximetry throughout the procedure with no obvious problem.  ASSESSMENT:  No evidence of further polyps.  PLAN:  Will recommend repeating procedure in three years. DD:  01/08/00 TD:  01/09/00 Job: [id number]

## 2011-04-28 HISTORY — PX: DOPPLER ECHOCARDIOGRAPHY: SHX263

## 2012-06-29 LAB — PACEMAKER DEVICE OBSERVATION

## 2012-07-03 ENCOUNTER — Other Ambulatory Visit (HOSPITAL_COMMUNITY): Payer: Self-pay | Admitting: Cardiovascular Disease

## 2012-07-03 DIAGNOSIS — Z95810 Presence of automatic (implantable) cardiac defibrillator: Secondary | ICD-10-CM

## 2012-07-03 DIAGNOSIS — I509 Heart failure, unspecified: Secondary | ICD-10-CM

## 2012-07-03 DIAGNOSIS — I428 Other cardiomyopathies: Secondary | ICD-10-CM

## 2012-07-07 ENCOUNTER — Ambulatory Visit (HOSPITAL_COMMUNITY)
Admission: RE | Admit: 2012-07-07 | Discharge: 2012-07-07 | Disposition: A | Discharge status: Home / Self Care | Payer: BC Managed Care – PPO | Source: Ambulatory Visit | Attending: Cardiovascular Disease | Admitting: Cardiovascular Disease

## 2012-07-07 DIAGNOSIS — Z95810 Presence of automatic (implantable) cardiac defibrillator: Secondary | ICD-10-CM | POA: Insufficient documentation

## 2012-07-07 DIAGNOSIS — I428 Other cardiomyopathies: Secondary | ICD-10-CM | POA: Insufficient documentation

## 2012-07-07 DIAGNOSIS — I509 Heart failure, unspecified: Secondary | ICD-10-CM | POA: Insufficient documentation

## 2012-07-07 NOTE — Progress Notes (Signed)
[facility]   2D echo completed 07/07/2012.   Veda Canning, RDCS

## 2012-10-25 ENCOUNTER — Other Ambulatory Visit: Payer: Self-pay | Admitting: Orthopedic Surgery

## 2012-10-25 DIAGNOSIS — M545 Low back pain: Secondary | ICD-10-CM

## 2012-10-27 ENCOUNTER — Ambulatory Visit
Admission: RE | Admit: 2012-10-27 | Discharge: 2012-10-27 | Disposition: A | Discharge status: Home / Self Care | Payer: BC Managed Care – PPO | Source: Ambulatory Visit | Attending: Orthopedic Surgery | Admitting: Orthopedic Surgery

## 2012-10-27 ENCOUNTER — Other Ambulatory Visit: Payer: BC Managed Care – PPO

## 2012-10-27 DIAGNOSIS — M545 Low back pain: Secondary | ICD-10-CM

## 2012-11-01 ENCOUNTER — Other Ambulatory Visit: Payer: BC Managed Care – PPO

## 2012-12-12 ENCOUNTER — Telehealth: Payer: Self-pay | Admitting: Cardiovascular Disease

## 2012-12-12 NOTE — Telephone Encounter (Signed)
Pharmacy has faxed information on refill for diovan.  [facility] has not heard back from [place] and wife wants to know if the insurance company has been contacted.

## 2012-12-12 NOTE — Telephone Encounter (Signed)
Message forwarded to J.C. Wildman, LPN.  

## 2012-12-14 NOTE — Telephone Encounter (Signed)
Patient has called again. completely out of medication. Wants generic.

## 2012-12-14 NOTE — Telephone Encounter (Signed)
Returned call to [place], pt's wife.  Stated pt needs PA for Diovan and is out now.  Wanted to know status of PA.  Wife informed chart reviewed and PA received from [facility].  RN will fax to JC, LPN in [place] office to complete today.  Verbalized understanding and agreed w/ plan.

## 2012-12-15 NOTE — Telephone Encounter (Signed)
Dr. called

## 2012-12-29 ENCOUNTER — Telehealth: Payer: Self-pay | Admitting: Cardiovascular Disease

## 2012-12-29 NOTE — Telephone Encounter (Signed)
Informed patient that I called his insurance company and was told that his diovan has been denied. They will send to me a letter of the denial  Listing the covered medication. He also informs me that he has been out of the medication for 2 weeks and has not taken anything. I will inform Dr. Alanda Amass, as the patient wants him to review before he retires.

## 2012-12-29 NOTE — Telephone Encounter (Signed)
Has been out of his medication diovan for two weeks now because insurance states that  He should get generic form and Dr. Alanda Amass was supposed to send something in and he is still unable to get his medication . Please Call    Thanks

## 2013-01-01 ENCOUNTER — Other Ambulatory Visit: Payer: Self-pay | Admitting: *Deleted

## 2013-01-01 MED ORDER — LOSARTAN POTASSIUM 100 MG PO TABS: 100.0000 mg | ORAL_TABLET | Freq: Every day | ORAL | Status: DC

## 2013-01-01 NOTE — Telephone Encounter (Signed)
Per JC, LPN, Dr. Alanda Amass advised pt start Cozaar 100 mg daily and recheck BP in 2 weeks.  Returned call and informed pt per instructions by MD.  Pt verbalized understanding and agreed w/ plan.  Refill(s) sent to pharmacy and appt scheduled for BP check on 10.13.14 at 9am w/ Corine Shelter, PA-C.  Pt stated he has a defibrillator and wants to know if it can be checked when he comes in and also wants to know if he has to pay a $70 copay.  Pt informed RN will note he has a defibrillator on appt note and that RN is not sure about copay.  Pt verbalized understanding and agreed w/ plan.

## 2013-01-05 ENCOUNTER — Encounter: Payer: Self-pay | Admitting: *Deleted

## 2013-01-08 ENCOUNTER — Encounter: Payer: Self-pay | Admitting: Internal Medicine

## 2013-01-08 ENCOUNTER — Ambulatory Visit (INDEPENDENT_AMBULATORY_CARE_PROVIDER_SITE_OTHER): Payer: BC Managed Care – PPO | Admitting: Internal Medicine

## 2013-01-08 VITALS — BP 134/77 | HR 81 | Ht 71.0 in | Wt 273.0 lb

## 2013-01-08 DIAGNOSIS — E119 Type 2 diabetes mellitus without complications: Secondary | ICD-10-CM | POA: Insufficient documentation

## 2013-01-08 DIAGNOSIS — E1140 Type 2 diabetes mellitus with diabetic neuropathy, unspecified: Secondary | ICD-10-CM

## 2013-01-08 DIAGNOSIS — Z95810 Presence of automatic (implantable) cardiac defibrillator: Secondary | ICD-10-CM

## 2013-01-08 DIAGNOSIS — E1149 Type 2 diabetes mellitus with other diabetic neurological complication: Secondary | ICD-10-CM

## 2013-01-08 DIAGNOSIS — Z950 Presence of cardiac pacemaker: Secondary | ICD-10-CM

## 2013-01-08 DIAGNOSIS — I951 Orthostatic hypotension: Secondary | ICD-10-CM | POA: Insufficient documentation

## 2013-01-08 DIAGNOSIS — E1142 Type 2 diabetes mellitus with diabetic polyneuropathy: Secondary | ICD-10-CM

## 2013-01-08 DIAGNOSIS — I509 Heart failure, unspecified: Secondary | ICD-10-CM

## 2013-01-08 DIAGNOSIS — I428 Other cardiomyopathies: Secondary | ICD-10-CM

## 2013-01-08 DIAGNOSIS — R42 Dizziness and giddiness: Secondary | ICD-10-CM

## 2013-01-08 DIAGNOSIS — I5042 Chronic combined systolic (congestive) and diastolic (congestive) heart failure: Secondary | ICD-10-CM

## 2013-01-08 LAB — ICD DEVICE OBSERVATION
AL AMPLITUDE: 4.8 mv
AL IMPEDENCE ICD: 589 Ohm
AL THRESHOLD: 0.625 V
BATTERY VOLTAGE: 2.98 V
CHARGE TIME: 10.2 s
LV LEAD IMPEDENCE ICD: 684 Ohm
LV LEAD THRESHOLD: 2 V
MODE SWITCH EPISODES: 0
RV LEAD AMPLITUDE: 20 mv
RV LEAD IMPEDENCE ICD: 494 Ohm
TZON-0003VSLOWVT: 451.1 ms
TZON-0004VSLOWVT: 20
VENTRICULAR PACING ICD: 98.3 pct

## 2013-01-08 LAB — BASIC METABOLIC PANEL
BUN: 19 mg/dL (ref 6–23)
CO2: 29 mEq/L (ref 19–32)
Calcium: 9.3 mg/dL (ref 8.4–10.5)
Creatinine, Ser: 1.4 mg/dL (ref 0.4–1.5)
GFR: 56.16 mL/min — ABNORMAL LOW (ref >60.00)
Sodium: 136 mEq/L (ref 135–145)

## 2013-01-08 MED ORDER — FUROSEMIDE 20 MG PO TABS: 20.0000 mg | ORAL_TABLET | Freq: Every day | ORAL | Status: DC

## 2013-01-08 MED ORDER — LOSARTAN POTASSIUM 100 MG PO TABS: 50.0000 mg | ORAL_TABLET | Freq: Every day | ORAL | Status: DC

## 2013-01-08 MED ORDER — CARVEDILOL 25 MG PO TABS: 25.0000 mg | ORAL_TABLET | Freq: Two times a day (BID) | ORAL | Status: DC

## 2013-01-08 NOTE — Assessment & Plan Note (Signed)
The patient has some evidence of volume overload. We will reassess left ventricular function and also given his diabetes and other risk factors will undertake Myoview scan looking for the evolution of significant coronary disease which might be contributing. I would have a very low threshold for pursuing diagnostic angiography.  In addition we'll begin him on low-dose Lasix at 20 mg daily. We'll check a metabolic profile today will need to get this checked every 6 months on Aldactone.

## 2013-01-08 NOTE — Patient Instructions (Addendum)
Your physician has recommended you make the following change in your medication:  1) Increase Carvedilol to 25mg  twice a day 2) Decrease Losartan to 50 mg daily 3) Start Furosemide 20 mg daily   Your physician has requested that you have a lexiscan myoview. For further information please visit [email or web address] Please follow instruction sheet, as given.  Your physician recommends that you have lab work today: BMET  Your physician recommends that you schedule a follow-up appointment in: 3 weeks with [employer]

## 2013-01-08 NOTE — Progress Notes (Signed)
Patient Care Team: Lupita Raider, MD as PCP - General (Family Medicine)   HPI  Russell Fuentes is a 62 y.o. male Seen in followup for CRT -D implantation for congestive heart failure in the setting of nonischemic heart disease. He had previously implanted 6948-lead. There have been intercurrent normalization of LV systolic function  he underwent ICD system revision April 2011 at which time he received a [employer] Jude lead and a new defibrillator generator   He has a history of remote myocardial infarction  Last Myoview was 2010 demonstrated no ischemia. Last echocardiogram 4/14 EF 40-45%.  He's had problems recently with   dyspnea on exertion nocturnal dyspnea and some peripheral edema; and she's had some atypical nonradiating chest pain.  He's also had episodes of presyncope. This is most with bending over. Blood pressures at home have ranged from the 130--80 systolic range  No past medical history on file.  No past surgical history on file.  Current Outpatient Prescriptions  Medication Sig Dispense Refill  . carvedilol (COREG) 25 MG tablet Take 25 mg by mouth 2 (two) times daily with a meal. Take 1 and 1/2 tablet a day.      [name] glimepiride (AMARYL) 4 MG tablet Take 4 mg by mouth daily before breakfast.      . losartan (COZAAR) 100 MG tablet Take 1 tablet (100 mg total) by mouth daily.  90 tablet  1  . lovastatin (MEVACOR) 20 MG tablet Take 20 mg by mouth daily.      [name] spironolactone (ALDACTONE) 25 MG tablet Take 25 mg by mouth daily. 1/2 tab daily.       No current facility-administered medications for this visit.    Allergies  Allergen Reactions  . Codeine     Review of Systems negative except from HPI and PMH  Physical Exam BP 134/77  Pulse 81  Ht 5\' 11"  (1.803 m)  Wt 273 lb (123.832 kg)  BMI 38.09 kg/m2 Well developed and well nourished in no acute distress HENT normal E scleral and icterus clear Neck Supple JVP 5.6; carotids brisk and full Clear to ausculation  *Regular rate and rhythm, 2/6 murmur at the apex Soft with active bowel sounds No clubbing cyanosis 1+ Edema Alert and oriented, grossly normal motor and sensory function Skin Warm and Dry  ECG demonstrates P. synchronous pacing with [facility] axis an R. prime in lead V1  Assessment and  Plan

## 2013-01-08 NOTE — Assessment & Plan Note (Signed)
The patient's device was interrogated.  The information was reviewed. No changes were made in the programming.    

## 2013-01-08 NOTE — Assessment & Plan Note (Signed)
As above.

## 2013-01-08 NOTE — Assessment & Plan Note (Signed)
Continue current medications. We'll decrease the doses because of the problems with orthostatic lightheadedness

## 2013-01-15 ENCOUNTER — Encounter: Payer: BC Managed Care – PPO | Admitting: Cardiology

## 2013-01-25 ENCOUNTER — Ambulatory Visit (HOSPITAL_COMMUNITY): Payer: BC Managed Care – PPO | Attending: Internal Medicine | Admitting: Radiology

## 2013-01-25 VITALS — BP 138/82 | HR 70 | Ht 71.0 in | Wt 267.0 lb

## 2013-01-25 DIAGNOSIS — I509 Heart failure, unspecified: Secondary | ICD-10-CM | POA: Insufficient documentation

## 2013-01-25 DIAGNOSIS — I428 Other cardiomyopathies: Secondary | ICD-10-CM | POA: Insufficient documentation

## 2013-01-25 DIAGNOSIS — R079 Chest pain, unspecified: Secondary | ICD-10-CM

## 2013-01-25 DIAGNOSIS — I2510 Atherosclerotic heart disease of native coronary artery without angina pectoris: Secondary | ICD-10-CM

## 2013-01-25 MED ORDER — REGADENOSON 0.4 MG/5ML IV SOLN
0.4000 mg | Freq: Once | INTRAVENOUS | Status: AC
  Administered 2013-01-25: 0.4 mg via INTRAVENOUS

## 2013-01-25 MED ORDER — TECHNETIUM TC 99M SESTAMIBI GENERIC - CARDIOLITE
33.0000 | Freq: Once | INTRAVENOUS | Status: AC | PRN
  Administered 2013-01-25: 33 via INTRAVENOUS

## 2013-01-25 MED ORDER — TECHNETIUM TC 99M SESTAMIBI GENERIC - CARDIOLITE
11.0000 | Freq: Once | INTRAVENOUS | Status: AC | PRN
  Administered 2013-01-25: 11 via INTRAVENOUS

## 2013-01-25 NOTE — Progress Notes (Signed)
[facility] SITE 3 NUCLEAR MED [address] [place], [place] [address] [phone]    Cardiology Nuclear Med Study  Russell Fuentes is a 62 y.o. male     MRN : [id number]     DOB: March 17, 1951  Procedure Date: 01/25/2013  Nuclear Med Background Indication for Stress Test:  Evaluation for Ischemia History: MI; AICD;  '05 Cath:Normal Coronary arteries; '10 Myocardial Perfusion Imaging-Normal, EF=50%; 07-2012 Echo: EF=40-45% Cardiac Risk Factors: Strong, Premature Family History - CAD, Hypertension, Lipids, LBBB, and NIDDM  Symptoms:  Chest Pain and Near Syncope   Nuclear Pre-Procedure Caffeine/Decaff Intake:  None > 12 hrs NPO After: 8:00pm   Lungs:  clear O2 Sat: 98% on room air. IV 0.9% NS with Angio Cath:  22g  IV Site: R Antecubital x 1, tolerated well IV Started by:  Irean Hong, RN  Chest Size (in):  54 Cup Size: n/a  Height: 5\' 11"  (1.803 m)  Weight:  267 lb (121.11 kg)  BMI:  Body mass index is 37.26 kg/(m^2). Tech Comments:  No medications today    Nuclear Med Study 1 or 2 day study: 1 day  Stress Test Type:  Lexiscan  Reading MD: Dietrich Pates, MD  Order Authorizing Provider:  Sherryl Manges, MD  Resting Radionuclide: Technetium 56m Sestamibi  Resting Radionuclide Dose: 11.0 mCi   Stress Radionuclide:  Technetium 64m Sestamibi  Stress Radionuclide Dose: 33.0 mCi           Stress Protocol Rest HR: 70 Stress HR: 99  Rest BP: 138/82 Stress BP: 138/97  Exercise Time (min): n/a METS: n/a   Predicted Max HR: 157 bpm % Max HR: 63.06 bpm Rate Pressure Product: 21308   Dose of Adenosine (mg):  n/a Dose of Lexiscan: 0.4 mg  Dose of Atropine (mg): n/a Dose of Dobutamine: n/a mcg/kg/min (at max HR)  Stress Test Technologist: Nelson Chimes, BS-ES  Nuclear Technologist:  Dario Guardian, CNMT     Rest Procedure:  Myocardial perfusion imaging was performed at rest 45 minutes following the intravenous administration of Technetium 26m Sestamibi. Rest ECG:  SR   Paced.    Stress Procedure:  The patient received IV Lexiscan 0.4 mg over 15-seconds.  Technetium 71m Sestamibi injected at 30-seconds.  Quantitative spect images were obtained after a 45 minute delay. During the infusion of Lexiscan, patient had chest tightness, SOB and weakness.  Symptoms began to resolve with recovery.  Stress MVH:[id number] because of pacing.    QPS Raw Data Images:  Images were motion corrected.  Soft tissue (diaphragm, bowel)underlie heart. Stress Images: Small defect at apex.  Otherwise normal perfusion.   Rest Images: Thinning with decreased counts in the inferoseptal wal (mid, distal) and apex.   Subtraction (SDS):  No evidence of ischemia. Transient Ischemic Dilatation (Normal <1.22):  1.02 Lung/Heart Ratio (Normal <0.45):  0.42  Quantitative Gated Spect Images QGS EDV:  n/a ml QGS ESV:  n/a ml  Impression Exercise Capacity:  Lexiscan with no exercise. BP Response:  Normal blood pressure response. Clinical Symptoms:  Mild chest pain/dyspnea. ECG Impression:  Nondiagnostic due to pacing.   Comparison with Prior Nuclear Study: No images to compare  Overall Impression:  Small apical defect, cannot r/o small region of scar vs apical thinning  Otherwise normal perfusion.  No ischemia.  Consider echo if not done to define wall motion             LV Ejection Fraction: Study not gated.  LV Wall Motion:  Not  gated.   Dietrich Pates

## 2013-02-02 ENCOUNTER — Telehealth: Payer: Self-pay | Admitting: *Deleted

## 2013-02-02 ENCOUNTER — Telehealth: Payer: Self-pay | Admitting: Internal Medicine

## 2013-02-02 ENCOUNTER — Encounter: Payer: Self-pay | Admitting: Cardiology

## 2013-02-02 ENCOUNTER — Ambulatory Visit (INDEPENDENT_AMBULATORY_CARE_PROVIDER_SITE_OTHER): Payer: BC Managed Care – PPO | Admitting: Cardiology

## 2013-02-02 VITALS — BP 125/78 | HR 77 | Wt 267.8 lb

## 2013-02-02 DIAGNOSIS — I5042 Chronic combined systolic (congestive) and diastolic (congestive) heart failure: Secondary | ICD-10-CM

## 2013-02-02 DIAGNOSIS — R0609 Other forms of dyspnea: Secondary | ICD-10-CM

## 2013-02-02 DIAGNOSIS — I428 Other cardiomyopathies: Secondary | ICD-10-CM

## 2013-02-02 DIAGNOSIS — I509 Heart failure, unspecified: Secondary | ICD-10-CM

## 2013-02-02 LAB — BASIC METABOLIC PANEL
CO2: 27 mEq/L (ref 19–32)
Calcium: 8.9 mg/dL (ref 8.4–10.5)
Chloride: 104 mEq/L (ref 96–112)
Sodium: 138 mEq/L (ref 135–145)

## 2013-02-02 NOTE — Telephone Encounter (Signed)
Lm for pt to call office to get lab results. Number provided

## 2013-02-02 NOTE — Patient Instructions (Addendum)
Your physician has requested that you have an echocardiogram. Echocardiography is a painless test that uses sound waves to create images of your heart. It provides your doctor with information about the size and shape of your heart and how well your heart's chambers and valves are working. This procedure takes approximately one hour. There are no restrictions for this procedure.  Your physician recommends that you return for lab work in: BMET TODAY  Your physician recommends that you schedule a follow-up appointment in: PENDING RESULTS WE WILL CALL YOU TO MAKE APPOINTMENT  Your physician recommends that you continue on your current medications as directed. Please refer to the Current Medication list given to you today.

## 2013-02-02 NOTE — Progress Notes (Signed)
ELECTROPHYSIOLOGY OFFICE NOTE  Patient ID: Russell Fuentes MRN: [id number], DOB/AGE: 63-Sep-1952   Date of Visit: 02/02/2013  Primary Physician: Lupita Raider, MD Primary Cardiologist: previously Alanda Amass MD, now Graciela Husbands MD Reason for Visit: 2-week follow-up  History of Present Illness  Russell Fuentes is a 62 y.o. male with NICM, EF 40-45% by last echo April 2014, s/p CRT-D implant with system revision 2011 and orthostatic hypotension who presents today for 2-week followup. He was evaluated by Dr. Graciela Husbands on 01/08/2013 with symptoms of DOE and LE swelling and was was volume overloaded. Dr. Graciela Husbands added Lasix 20 mg daily. Myoview stress test was also ordered for risk stratification. His device was interrogated which revealed normal BiV ICD function and no programming changes were made.  Since last being seen in our clinic, he reports he is feeling better. His LE swelling has improved with Lasix. DOE is improved but still present with moderate exertion such as yard work / trimming bushes and cleaning barn. He denies chest pain. He denies palpitations, dizziness, near syncope or syncope. He denies orthopnea, PND or recent weight gain. He is compliant with medications.  Past Medical History Past Medical History  Diagnosis Date  . Chronic combined systolic and diastolic heart failure 01/08/2013  . Nonischemic cardiomyopathy 07/16/2009    Qualifier: Diagnosis of  By: Omar Person    . Orthostatic lightheadedness 01/08/2013  . 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 Diabetes mellitus with neuropathy 01/08/2013    Past Surgical History Past Surgical History  Procedure Laterality Date  . Coronary artery bypass graft      Allergies/Intolerances Allergies  Allergen Reactions  . Codeine     Current Home Medications Current Outpatient Prescriptions  Medication Sig Dispense Refill  . Canagliflozin (INVOKANA PO) Take by mouth daily.      .  carvedilol (COREG) 25 MG tablet Take 1 tablet (25 mg total) by mouth 2 (two) times daily.  180 tablet  3  . furosemide (LASIX) 20 MG tablet Take 1 tablet (20 mg total) by mouth daily.  30 tablet  3  . glimepiride (AMARYL) 4 MG tablet Take 4 mg by mouth daily before breakfast.      . losartan (COZAAR) 100 MG tablet Take 0.5 tablets (50 mg total) by mouth daily.  90 tablet  3  . lovastatin (MEVACOR) 20 MG tablet Take 20 mg by mouth daily.      [name] spironolactone (ALDACTONE) 25 MG tablet Take 25 mg by mouth daily. 1/2 tab daily.       No current facility-administered medications for this visit.    Social History Social History  . Marital Status: Married   Social History Main Topics  . Smoking status: Never Smoker   . Smokeless tobacco: No  . Alcohol Use: No  . Drug Use: No   Review of Systems General: No chills, fever, night sweats or weight changes Cardiovascular: No chest pain, edema, orthopnea, palpitations, paroxysmal nocturnal dyspnea Dermatological: No rash, lesions or masses Respiratory: No cough Urologic: No hematuria, dysuria Abdominal: No nausea, vomiting, diarrhea, bright red blood per rectum, melena, or hematemesis Neurologic: No visual changes, weakness, changes in mental status All other systems reviewed and are otherwise negative except as noted above.  Physical Exam Vitals: Blood pressure 125/78, pulse 77, weight 267 lb 12.8 oz (121.473 kg).  General: Well developed, well appearing 62 y.o. male in no acute distress. HEENT: Normocephalic, atraumatic. EOMs intact. Sclera nonicteric. Oropharynx clear.  Neck: Supple without bruits. No JVD. Lungs:  Respirations regular and unlabored, CTA bilaterally. No wheezes, rales or rhonchi. Heart: RRR. S1, S2 present. No murmurs, rub, S3 or S4. Abdomen: Soft, non-tender, non-distended. BS present x 4 quadrants. No hepatosplenomegaly.  Extremities: No clubbing, cyanosis or edema. DP/PT/Radials 2+ and equal bilaterally. Psych: Normal  affect. Neuro: Alert and oriented X 3. Moves all extremities spontaneously.   Diagnostics Labs 01/08/2013    Component Value Date/Time   NA 136 01/08/2013 1448   K 4.0 01/08/2013 1448   CL 104 01/08/2013 1448   CO2 29 01/08/2013 1448   GLUCOSE 190* 01/08/2013 1448   BUN 19 01/08/2013 1448   CREATININE 1.4 01/08/2013 1448   CALCIUM 9.3 01/08/2013 1448   GFRNONAA 59.85 07/16/2009 1003   LexiScan Myoview 01/25/2013 Overall Impression: Small apical defect, cannot r/o small region of scar vs apical thinning. Otherwise normal perfusion. No ischemia. Consider echo if not done to define wall motion. LV Ejection Fraction: Study not gated. LV Wall Motion: Not gated.   Assessment and Plan 1. Chronic combined systolic and diastolic HF - LE swelling has improved with Lasix; DOE is improved but still present with moderate exertion such as yard work / trimming bushes and cleaning barn  - LexiScan Myoview negative for ischemia, see report as above (LVEF and wall motion not done as study was not gated) - will order echo to evaluate LVEF and wall motion; if LVEF worse, will probably need cardiac cath for definitive evaluation - continue daily Lasix and will repeat BMET today 2. NICM, EF 40-45% by echo April 2014 - as above, will reassess LV function by repeat echo - continue medical therapy; no changes made today  Signed, Elliana Bal, PA-C 02/02/2013, 10:31 AM

## 2013-02-02 NOTE — Telephone Encounter (Signed)
Follow Up  ° °Pt returned call for results °

## 2013-02-05 NOTE — Telephone Encounter (Signed)
Follow Up:  Pt states he is returning Sherri's call. Please advise

## 2013-02-05 NOTE — Telephone Encounter (Signed)
lmtcb

## 2013-02-09 NOTE — Telephone Encounter (Addendum)
Russell Fuentes, [facility] saw this patient, recommends echo per her office visit with patient

## 2013-02-15 ENCOUNTER — Telehealth: Payer: Self-pay | Admitting: *Deleted

## 2013-02-15 ENCOUNTER — Encounter: Payer: Self-pay | Admitting: Cardiology

## 2013-02-15 ENCOUNTER — Other Ambulatory Visit (HOSPITAL_COMMUNITY): Payer: BC Managed Care – PPO

## 2013-02-15 ENCOUNTER — Ambulatory Visit (HOSPITAL_COMMUNITY): Payer: BC Managed Care – PPO | Attending: Cardiology | Admitting: Radiology

## 2013-02-15 DIAGNOSIS — R0989 Other specified symptoms and signs involving the circulatory and respiratory systems: Secondary | ICD-10-CM | POA: Insufficient documentation

## 2013-02-15 DIAGNOSIS — I428 Other cardiomyopathies: Secondary | ICD-10-CM | POA: Insufficient documentation

## 2013-02-15 DIAGNOSIS — I509 Heart failure, unspecified: Secondary | ICD-10-CM | POA: Insufficient documentation

## 2013-02-15 DIAGNOSIS — R0609 Other forms of dyspnea: Secondary | ICD-10-CM | POA: Insufficient documentation

## 2013-02-15 NOTE — Telephone Encounter (Signed)
called pt with results and Pt states since he started taking losartan he started getting sinus problem and headaches. Pt states he would like to go back on the Generic for Diovan. Pt states his BP has been running good. Informed pt that I will pass this on to Dr. Odessa Fleming nurse so she could talk about this with Dr. Graciela Husbands. Pt agreed.

## 2013-02-15 NOTE — Progress Notes (Signed)
Echocardiogram performed.  

## 2013-02-16 ENCOUNTER — Other Ambulatory Visit: Payer: Self-pay | Admitting: *Deleted

## 2013-02-16 MED ORDER — VALSARTAN 160 MG PO TABS: 160.0000 mg | ORAL_TABLET | Freq: Every day | ORAL | Status: DC

## 2013-02-16 NOTE — Telephone Encounter (Signed)
Discussed with Rick Duff PAC, who saw patient last. Orders to stop Losartan and restart back on Diovan 160 mg daily. I sent in prescription for Valsartan per patient request. Advised to stop Losartan once he picks up his prescription for Valsartan. Patient agreeable to plan.

## 2013-04-03 ENCOUNTER — Other Ambulatory Visit: Payer: Self-pay | Admitting: *Deleted

## 2013-04-03 MED ORDER — SPIRONOLACTONE 25 MG PO TABS: 12.5000 mg | ORAL_TABLET | Freq: Every day | ORAL | Status: DC

## 2013-04-11 ENCOUNTER — Encounter: Payer: BC Managed Care – PPO | Admitting: *Deleted

## 2013-04-18 ENCOUNTER — Encounter: Payer: Self-pay | Admitting: *Deleted

## 2013-04-26 LAB — MDC_IDC_ENUM_SESS_TYPE_REMOTE
Battery Voltage: 2.95 V
Brady Statistic AP VS Percent: 0.06 %
Brady Statistic AS VP Percent: 54.32 %
Brady Statistic AS VS Percent: 3.12 %
Brady Statistic RA Percent Paced: 42.56 %
Brady Statistic RV Percent Paced: 96.82 %
Date Time Interrogation Session: 20150122153541
HIGH POWER IMPEDANCE MEASURED VALUE: 52 Ohm
HighPow Impedance: 64 Ohm
Lead Channel Impedance Value: 4047 Ohm
Lead Channel Impedance Value: 551 Ohm
Lead Channel Impedance Value: 551 Ohm
Lead Channel Pacing Threshold Amplitude: 1.25 V
Lead Channel Pacing Threshold Pulse Width: 0.4 ms
Lead Channel Sensing Intrinsic Amplitude: 27.25 mV
Lead Channel Sensing Intrinsic Amplitude: 3.125 mV
Lead Channel Setting Pacing Amplitude: 2.25 V
Lead Channel Setting Pacing Pulse Width: 0.4 ms
Lead Channel Setting Sensing Sensitivity: 0.6 mV
MDC IDC MSMT LEADCHNL LV IMPEDANCE VALUE: 4047 Ohm
MDC IDC MSMT LEADCHNL LV PACING THRESHOLD AMPLITUDE: 1 V
MDC IDC MSMT LEADCHNL LV PACING THRESHOLD PULSEWIDTH: 0.4 ms
MDC IDC MSMT LEADCHNL RA PACING THRESHOLD AMPLITUDE: 0.625 V
MDC IDC MSMT LEADCHNL RA PACING THRESHOLD PULSEWIDTH: 0.4 ms
MDC IDC MSMT LEADCHNL RA SENSING INTR AMPL: 3.125 mV
MDC IDC MSMT LEADCHNL RV IMPEDANCE VALUE: 494 Ohm
MDC IDC MSMT LEADCHNL RV SENSING INTR AMPL: 27.25 mV
MDC IDC SET LEADCHNL RA PACING AMPLITUDE: 2 V
MDC IDC SET LEADCHNL RV PACING AMPLITUDE: 2.5 V
MDC IDC SET LEADCHNL RV PACING PULSEWIDTH: 0.4 ms
MDC IDC SET ZONE DETECTION INTERVAL: 360 ms
MDC IDC STAT BRADY AP VP PERCENT: 42.5 %
Zone Setting Detection Interval: 320 ms
Zone Setting Detection Interval: 350 ms
Zone Setting Detection Interval: 450 ms

## 2013-04-27 ENCOUNTER — Ambulatory Visit (INDEPENDENT_AMBULATORY_CARE_PROVIDER_SITE_OTHER): Payer: BC Managed Care – PPO | Admitting: *Deleted

## 2013-04-27 DIAGNOSIS — I428 Other cardiomyopathies: Secondary | ICD-10-CM

## 2013-04-27 DIAGNOSIS — I5042 Chronic combined systolic (congestive) and diastolic (congestive) heart failure: Secondary | ICD-10-CM

## 2013-05-10 ENCOUNTER — Telehealth: Payer: Self-pay | Admitting: Internal Medicine

## 2013-05-10 NOTE — Telephone Encounter (Signed)
PT RTN CALL RE SETTING UP APPT WITH [facility], PT STATES HE DOESN'T UNDERSTAND WHY HE NEEDS TO BE SEEN, HE WAS TOLD EVERYTHING LOOKED FINE ON TRANSMISSION, PLS CALL PT WITH MORE INFO AT [phone]/MT

## 2013-05-11 NOTE — Telephone Encounter (Signed)
I informed Mr.Russell Fuentes that per SK's last note he was supposed to F/U with Russell Fuentes in Nov. 2014 following med changes, but since this appointment was never made he needed to F/U. Patient voiced understanding and plans to follow up with Russell Fuentes on 2-10.

## 2013-05-15 ENCOUNTER — Encounter: Payer: Self-pay | Admitting: Cardiology

## 2013-05-15 ENCOUNTER — Ambulatory Visit (INDEPENDENT_AMBULATORY_CARE_PROVIDER_SITE_OTHER): Payer: BC Managed Care – PPO | Admitting: Cardiology

## 2013-05-15 VITALS — BP 127/77 | HR 90 | Ht 71.0 in | Wt 274.0 lb

## 2013-05-15 DIAGNOSIS — I428 Other cardiomyopathies: Secondary | ICD-10-CM

## 2013-05-15 DIAGNOSIS — I472 Ventricular tachycardia, unspecified: Secondary | ICD-10-CM

## 2013-05-15 DIAGNOSIS — I4729 Other ventricular tachycardia: Secondary | ICD-10-CM

## 2013-05-15 DIAGNOSIS — Z95810 Presence of automatic (implantable) cardiac defibrillator: Secondary | ICD-10-CM

## 2013-05-15 LAB — MDC_IDC_ENUM_SESS_TYPE_INCLINIC
Brady Statistic AP VS Percent: 0.06 %
Brady Statistic AS VP Percent: 53.64 %
Brady Statistic AS VS Percent: 3.03 %
Brady Statistic RV Percent Paced: 96.91 %
Date Time Interrogation Session: 20150210112836
HIGH POWER IMPEDANCE MEASURED VALUE: 59 Ohm
HighPow Impedance: 74 Ohm
Lead Channel Impedance Value: 475 Ohm
Lead Channel Impedance Value: 551 Ohm
Lead Channel Impedance Value: 684 Ohm
Lead Channel Pacing Threshold Amplitude: 1 V
Lead Channel Pacing Threshold Amplitude: 1.25 V
Lead Channel Pacing Threshold Amplitude: 1.25 V
Lead Channel Pacing Threshold Pulse Width: 0.4 ms
Lead Channel Pacing Threshold Pulse Width: 0.4 ms
Lead Channel Pacing Threshold Pulse Width: 0.4 ms
Lead Channel Sensing Intrinsic Amplitude: 23.375 mV
Lead Channel Sensing Intrinsic Amplitude: 3 mV
Lead Channel Sensing Intrinsic Amplitude: 4 mV
Lead Channel Setting Pacing Amplitude: 2.75 V
Lead Channel Setting Pacing Pulse Width: 0.4 ms
Lead Channel Setting Pacing Pulse Width: 0.4 ms
Lead Channel Setting Sensing Sensitivity: 0.6 mV
MDC IDC MSMT BATTERY VOLTAGE: 2.92 V
MDC IDC MSMT LEADCHNL LV IMPEDANCE VALUE: 4047 Ohm
MDC IDC MSMT LEADCHNL LV IMPEDANCE VALUE: 4047 Ohm
MDC IDC MSMT LEADCHNL RA PACING THRESHOLD AMPLITUDE: 0.5 V
MDC IDC MSMT LEADCHNL RA PACING THRESHOLD AMPLITUDE: 0.625 V
MDC IDC MSMT LEADCHNL RA PACING THRESHOLD PULSEWIDTH: 0.4 ms
MDC IDC MSMT LEADCHNL RA PACING THRESHOLD PULSEWIDTH: 0.4 ms
MDC IDC MSMT LEADCHNL RV PACING THRESHOLD AMPLITUDE: 1.125 V
MDC IDC MSMT LEADCHNL RV PACING THRESHOLD PULSEWIDTH: 0.4 ms
MDC IDC MSMT LEADCHNL RV SENSING INTR AMPL: 25 mV
MDC IDC SET LEADCHNL LV PACING AMPLITUDE: 2.75 V
MDC IDC SET LEADCHNL RA PACING AMPLITUDE: 2 V
MDC IDC SET ZONE DETECTION INTERVAL: 320 ms
MDC IDC STAT BRADY AP VP PERCENT: 43.26 %
MDC IDC STAT BRADY RA PERCENT PACED: 43.32 %
Zone Setting Detection Interval: 350 ms
Zone Setting Detection Interval: 360 ms
Zone Setting Detection Interval: 450 ms

## 2013-05-15 NOTE — Patient Instructions (Addendum)
Your physician recommends that you keep your scheduled follow-up appointment Remote check 08/13/13  Your physician wants you to follow-up in: 6 months with Dr. Gari Crown will receive a reminder letter in the mail two months in advance. If you don't receive a letter, please call our office to schedule the follow-up appointment.   Your physician recommends that you continue on your current medications as directed. Please refer to the Current Medication list given to you today.

## 2013-05-15 NOTE — Progress Notes (Signed)
Patient ID: Russell Fuentes MRN: [id number], DOB/AGE: May 12, 1950   Date of Visit: 05/15/2013  Primary Physician: [name], MD Primary Cardiologist: previously Rollene Fare MD, now [name] MD  Reason for Visit: EP/device follow-up   History of Present Illness  Russell Fuentes is a 63 y.o. male with NICM, EF 40-45% by last echo April 2014, s/p CRT-D implant with system revision 2011 and orthostatic hypotension who presents today for EP followup. He had a remote check recently which showed one episode of NSVT; therefore, follow-up today was arranged.  Since last being seen in our clinic, he reports he is doing well and has no complaints. He has resumed walking 2 miles per day and is pleased with his progress. He has chronic dyspnea with moderate exertion but this is improved after the addition of Lasix daily and stable. He is able to do yard work / trimming bushes and cleaning barn without much difficulty. He denies chest pain. He denies palpitations, dizziness, near syncope or syncope. He denies orthopnea, PND or recent weight gain. He is compliant with medications.   Past Medical History Past Medical History  Diagnosis Date  . Chronic combined systolic and diastolic heart failure 38/10/5641  . Nonischemic cardiomyopathy 07/16/2009    Qualifier: Diagnosis of  By: Mack Guise    . Orthostatic lightheadedness 01/08/2013  . 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 Diabetes mellitus with neuropathy 01/08/2013    Past Surgical History Past Surgical History  Procedure Laterality Date  . Coronary artery bypass graft      Allergies/Intolerances Allergies  Allergen Reactions  . Codeine     Current Home Medications Current Outpatient Prescriptions  Medication Sig Dispense Refill  . Canagliflozin (INVOKANA PO) Take by mouth daily.      . carvedilol (COREG) 25 MG tablet Take 1 tablet (25 mg total) by mouth 2 (two) times daily.  180 tablet  3  .  furosemide (LASIX) 20 MG tablet Take 1 tablet (20 mg total) by mouth daily.  30 tablet  3  . glimepiride (AMARYL) 4 MG tablet Take 4 mg by mouth daily before breakfast.      . lovastatin (MEVACOR) 20 MG tablet Take 20 mg by mouth daily.      [name] spironolactone (ALDACTONE) 25 MG tablet Take 0.5 tablets (12.5 mg total) by mouth daily.  30 tablet  3  . valsartan (DIOVAN) 160 MG tablet Take 1 tablet (160 mg total) by mouth daily.  30 tablet  3   No current facility-administered medications for this visit.    Social History History   Social History  . Marital Status: Married    Spouse Name: N/A    Number of Children: N/A  . Years of Education: N/A   Occupational History  . Not on file.   Social History Main Topics  . Smoking status: Never Smoker   . Smokeless tobacco: Not on file  . Alcohol Use: Not on file  . Drug Use: Not on file  . Sexual Activity: Not on file   Other Topics Concern  . Not on file   Social History Narrative  . No narrative on file     Review of Systems General: No chills, fever, night sweats or weight changes Cardiovascular: No chest pain, dyspnea on exertion, edema, orthopnea, palpitations, paroxysmal nocturnal dyspnea Dermatological: No rash, lesions or masses Respiratory: No cough, dyspnea Urologic: No hematuria, dysuria Abdominal: No nausea, vomiting, diarrhea, bright red blood per rectum, melena, or hematemesis Neurologic: No visual changes,  weakness, changes in mental status All other systems reviewed and are otherwise negative except as noted above.  Physical Exam Vitals: Blood pressure 127/77, pulse 90, height 5\' 11"  (1.803 m), weight 274 lb (124.286 kg).  General: Well developed, well appearing 63 y.o. male in no acute distress. HEENT: Normocephalic, atraumatic. EOMs intact. Sclera nonicteric. Oropharynx clear.  Neck: Supple. No JVD. Lungs: Respirations regular and unlabored, CTA bilaterally. No wheezes, rales or rhonchi. Heart: RRR. S1, S2  present. No murmurs, rub, S3 or S4. Abdomen: Soft, non-distended.  Extremities: No clubbing, cyanosis or edema. PT/Radials 2+ and equal bilaterally. Psych: Normal affect. Neuro: Alert and oriented X 3. Moves all extremities spontaneously.   Diagnostics  LexiScan Myoview 01/25/2013  Overall Impression: Small apical defect, cannot r/o small region of scar vs apical thinning. Otherwise normal perfusion. No ischemia. Consider echo if not done to define wall motion. LV Ejection Fraction: Study not gated. LV Wall Motion: Not gated.  Echocardiogram 02/15/2013 Study Conclusions - Left ventricle: The cavity size was normal. Wall thickness was normal. Systolic function was normal. The estimated ejection fraction was in the range of 55% to 60%. Wall motion was normal; there were no regional wall motion abnormalities. Left ventricular diastolic function parameters were normal. - Left atrium: The atrium was mildly dilated.  12-lead ECG today - A sensed V paced with occasional PVC Device interrogation today - Thresholds and sensing consistent with previous device measurements. Lead impedance trends stable over time. No mode switch episodes recorded. 1 VT-NS episode on 04/23/2013, 2 seconds in duration. Otherwise no ventricular arrhythmia episodes recorded. Patient bi-ventricularly pacing >96% of the time. Device programmed with appropriate safety margins. Heart failure diagnostics reviewed and trends are stable for patient. No changes made this session.  Assessment and Plan  1. NSVT - one episode 2 seconds in duration, asymptomatic - recommended BMET and Mg today but Mr. Carchi declined - continue BB  2. NICM s/p CRT-D implant - with intercurrent normalization of LVEF by recent echo (as above LVEF 50-55% by echo Nov 2014) - normal device function - no programming changes made - continue medical therapy - continue routine remote ICD follow-up every 3 months - return for follow-up with Dr.  [name] in 6 months  Signed, Ileene Hutchinson, PA-C 05/15/2013, 11:48 AM

## 2013-05-21 ENCOUNTER — Encounter: Payer: Self-pay | Admitting: Internal Medicine

## 2013-06-14 ENCOUNTER — Encounter: Payer: Self-pay | Admitting: Internal Medicine

## 2013-07-04 ENCOUNTER — Other Ambulatory Visit: Payer: Self-pay | Admitting: Cardiology

## 2013-07-19 ENCOUNTER — Other Ambulatory Visit: Payer: Self-pay | Admitting: *Deleted

## 2013-07-19 NOTE — Telephone Encounter (Signed)
Verbal order given to pharmacy, stated they did not receive the electronic refill that was sent in on 07/04/13

## 2013-07-24 ENCOUNTER — Other Ambulatory Visit: Payer: Self-pay | Admitting: *Deleted

## 2013-07-24 MED ORDER — CARVEDILOL 25 MG PO TABS: 25.0000 mg | ORAL_TABLET | Freq: Two times a day (BID) | ORAL | Status: DC

## 2013-08-09 ENCOUNTER — Telehealth: Payer: Self-pay | Admitting: Internal Medicine

## 2013-08-09 NOTE — Telephone Encounter (Signed)
Advised pt to call whom ever follows his diabetes to discuss pill and side effects. Advised to call PCP about BP issues.  Pt tells me that last week he spent 2 hours in yard weed eating, and came inside and felt horrible. He states that his vision was "seeing bright white" and again this past Saturday he experienced same problem.  He also states that his BP machine stated his HR was 42 Saturday, and today it showed 52.  I explained that he needs to follow up with his PCP stat, and that I would review this information with device clinic. I also asked him to send in a transmission for device to review. I further educated patient to call doctor if/when this ever happens again and not to wait.   Patient verbalized understanding and agreeable to plan.

## 2013-08-09 NOTE — Telephone Encounter (Signed)
New message    Talk to a nurse---bp is dropping--117/68-------yesterday it was 88/51. Pt is dizzy and have lightheadedness.  He is on a new diabetic pill.  Could this be the problem?

## 2013-08-10 ENCOUNTER — Ambulatory Visit (INDEPENDENT_AMBULATORY_CARE_PROVIDER_SITE_OTHER): Payer: BC Managed Care – PPO | Admitting: *Deleted

## 2013-08-10 DIAGNOSIS — I509 Heart failure, unspecified: Secondary | ICD-10-CM

## 2013-08-10 DIAGNOSIS — I428 Other cardiomyopathies: Secondary | ICD-10-CM

## 2013-08-10 NOTE — Telephone Encounter (Signed)
Explained to pt that PVCs or runs of "extra beats" in his heart will cause a blood pressure cuff to misread actual pulse rates.  His device is set at a lower rate of 70bpm.  Pt more concerned about his b/p & sugar levels. I told pt I agree to follow Sherri's advice to call his PCP about b/p issues.

## 2013-08-10 NOTE — Telephone Encounter (Signed)
LMOVM w/ my direct #. 

## 2013-08-23 LAB — MDC_IDC_ENUM_SESS_TYPE_REMOTE
Battery Voltage: 2.85 V
Brady Statistic RA Percent Paced: 45.68 %
Date Time Interrogation Session: 20150508111439
HIGH POWER IMPEDANCE MEASURED VALUE: 50 Ohm
HIGH POWER IMPEDANCE MEASURED VALUE: 65 Ohm
Lead Channel Impedance Value: 437 Ohm
Lead Channel Impedance Value: 589 Ohm
Lead Channel Pacing Threshold Amplitude: 0.625 V
Lead Channel Pacing Threshold Amplitude: 1.125 V
Lead Channel Pacing Threshold Amplitude: 1.25 V
Lead Channel Pacing Threshold Pulse Width: 0.4 ms
Lead Channel Pacing Threshold Pulse Width: 0.4 ms
Lead Channel Sensing Intrinsic Amplitude: 25.125 mV
Lead Channel Sensing Intrinsic Amplitude: 25.125 mV
Lead Channel Sensing Intrinsic Amplitude: 3.125 mV
Lead Channel Sensing Intrinsic Amplitude: 3.125 mV
Lead Channel Setting Pacing Amplitude: 2 V
Lead Channel Setting Pacing Amplitude: 2.5 V
Lead Channel Setting Pacing Pulse Width: 0.4 ms
Lead Channel Setting Pacing Pulse Width: 0.4 ms
Lead Channel Setting Sensing Sensitivity: 0.6 mV
MDC IDC MSMT LEADCHNL LV IMPEDANCE VALUE: 4047 Ohm
MDC IDC MSMT LEADCHNL LV IMPEDANCE VALUE: 4047 Ohm
MDC IDC MSMT LEADCHNL RA IMPEDANCE VALUE: 532 Ohm
MDC IDC MSMT LEADCHNL RV PACING THRESHOLD PULSEWIDTH: 0.4 ms
MDC IDC SET LEADCHNL LV PACING AMPLITUDE: 2.25 V
MDC IDC SET ZONE DETECTION INTERVAL: 320 ms
MDC IDC SET ZONE DETECTION INTERVAL: 350 ms
MDC IDC STAT BRADY AP VP PERCENT: 45.62 %
MDC IDC STAT BRADY AP VS PERCENT: 0.06 %
MDC IDC STAT BRADY AS VP PERCENT: 51.35 %
MDC IDC STAT BRADY AS VS PERCENT: 2.97 %
MDC IDC STAT BRADY RV PERCENT PACED: 96.96 %
Zone Setting Detection Interval: 360 ms
Zone Setting Detection Interval: 450 ms

## 2013-08-24 NOTE — Progress Notes (Signed)
Remote ICD transmission.   

## 2013-09-07 ENCOUNTER — Encounter: Payer: Self-pay | Admitting: Cardiology

## 2013-09-13 ENCOUNTER — Encounter: Payer: Self-pay | Admitting: Internal Medicine

## 2013-11-13 ENCOUNTER — Other Ambulatory Visit: Payer: Self-pay | Admitting: Internal Medicine

## 2013-11-29 ENCOUNTER — Encounter: Payer: Self-pay | Admitting: Internal Medicine

## 2013-11-29 ENCOUNTER — Ambulatory Visit (INDEPENDENT_AMBULATORY_CARE_PROVIDER_SITE_OTHER): Payer: BC Managed Care – PPO | Admitting: Internal Medicine

## 2013-11-29 ENCOUNTER — Encounter: Payer: Self-pay | Admitting: *Deleted

## 2013-11-29 ENCOUNTER — Other Ambulatory Visit: Payer: Self-pay | Admitting: *Deleted

## 2013-11-29 VITALS — BP 104/72 | HR 76 | Ht 71.0 in | Wt 264.2 lb

## 2013-11-29 DIAGNOSIS — I5042 Chronic combined systolic (congestive) and diastolic (congestive) heart failure: Secondary | ICD-10-CM

## 2013-11-29 DIAGNOSIS — Z01812 Encounter for preprocedural laboratory examination: Secondary | ICD-10-CM

## 2013-11-29 DIAGNOSIS — Z95810 Presence of automatic (implantable) cardiac defibrillator: Secondary | ICD-10-CM

## 2013-11-29 DIAGNOSIS — R079 Chest pain, unspecified: Secondary | ICD-10-CM

## 2013-11-29 DIAGNOSIS — I428 Other cardiomyopathies: Secondary | ICD-10-CM

## 2013-11-29 LAB — MDC_IDC_ENUM_SESS_TYPE_INCLINIC
Battery Voltage: 2.73 V
Brady Statistic AS VP Percent: 48.9 %
Lead Channel Impedance Value: 532 Ohm
Lead Channel Pacing Threshold Amplitude: 3.25 V
Lead Channel Pacing Threshold Pulse Width: 0.4 ms
Lead Channel Pacing Threshold Pulse Width: 0.4 ms
Lead Channel Pacing Threshold Pulse Width: 0.4 ms
Lead Channel Sensing Intrinsic Amplitude: 4 mV
Lead Channel Setting Pacing Amplitude: 2.5 V
Lead Channel Setting Pacing Amplitude: 3.75 V
Lead Channel Setting Sensing Sensitivity: 0.6 mV
MDC IDC MSMT LEADCHNL LV IMPEDANCE VALUE: 589 Ohm
MDC IDC MSMT LEADCHNL RA PACING THRESHOLD AMPLITUDE: 0.5 V
MDC IDC MSMT LEADCHNL RV IMPEDANCE VALUE: 418 Ohm
MDC IDC MSMT LEADCHNL RV PACING THRESHOLD AMPLITUDE: 1 V
MDC IDC MSMT LEADCHNL RV SENSING INTR AMPL: 20 mV
MDC IDC SET LEADCHNL LV PACING PULSEWIDTH: 0.4 ms
MDC IDC SET LEADCHNL RA PACING AMPLITUDE: 2 V
MDC IDC SET LEADCHNL RV PACING PULSEWIDTH: 0.4 ms
MDC IDC SET ZONE DETECTION INTERVAL: 360 ms
MDC IDC STAT BRADY AP VP PERCENT: 47.7 %
MDC IDC STAT BRADY AP VS PERCENT: 0.1 % — AB
MDC IDC STAT BRADY AS VS PERCENT: 3.3 %
Zone Setting Detection Interval: 320 ms
Zone Setting Detection Interval: 350 ms
Zone Setting Detection Interval: 450 ms

## 2013-11-29 NOTE — Progress Notes (Signed)
Patient Care Team: [name], MD as PCP - General (Family Medicine)   HPI  Russell Fuentes is a 63 y.o. male Seen in followup for an ICD to CRT implanted for nonischemic cardiomyopathy. He also has a history of orthostasis   last year we saw him he was having complaints of dyspnea on exertion without chest discomfort. A Myoview was undertaken and demonstrated apical defect but no evidence of ischemia. This occurred in the context of previously known normal coronary arteries. Ejection fraction by echo 11/14 was normal  He comes in now with an 3 - 4 month history of progressive and increasingly severe exertional chest tightness relieved by rest  And is associated with dyspnea.  Most recently his hemoglobin A1c was over 7. He has lost 10 pounds a month and his sugars are better. Uses in addition to his endocrinologist is following his potassium levels and he will have been forwarded to [place]   Past Medical History  Diagnosis Date  . Chronic combined systolic and diastolic heart failure 94/04/7406  . Nonischemic cardiomyopathy 07/16/2009    Qualifier: Diagnosis of  By: Mack Guise    . Orthostatic lightheadedness 01/08/2013  . 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 Diabetes mellitus with neuropathy 01/08/2013    Past Surgical History  Procedure Laterality Date  . Coronary artery bypass graft      Current Outpatient Prescriptions  Medication Sig Dispense Refill  . carvedilol (COREG) 25 MG tablet Take 1 tablet (25 mg total) by mouth 2 (two) times daily.  180 tablet  1  . Empagliflozin-Linagliptin (GLYXAMBI) 25-5 MG TABS Take 1 tablet by mouth daily.      . furosemide (LASIX) 20 MG tablet Take 1 tablet (20 mg total) by mouth daily.  30 tablet  3  . glimepiride (AMARYL) 4 MG tablet Take 4 mg by mouth daily before breakfast.      . lovastatin (MEVACOR) 20 MG tablet Take 20 mg by mouth daily.      [name] spironolactone (ALDACTONE) 25  MG tablet TAKE ONE-HALF TABLET BY MOUTH EVERY DAY  30 tablet  0  . valsartan (DIOVAN) 160 MG tablet TAKE 1 TABLET BY MOUTH EVERY DAY  30 tablet  3   No current facility-administered medications for this visit.    Allergies  Allergen Reactions  . Codeine     Review of Systems negative except from HPI and PMH  Physical Exam BP 104/72  Pulse 76  Ht 5\' 11"  (1.803 m)  Wt 264 lb 3.2 oz (119.84 kg)  BMI 36.86 kg/m2 Well developed and well nourished in no acute distress HENT normal E scleral and icterus clear Neck Supple JVP flat; carotids brisk and full Clear to ausculation  Regular rate and rhythm, no murmurs gallops or rub Soft with active bowel sounds No clubbing cyanosis  Edema Alert and oriented, grossly normal motor and sensory function Skin Warm and Dry  ECG demonstrates P. synchronous biventricular pacing   Assessment and  Plan  Nonischemic cardiomyopathy-intercurrently resolved  Chest pain concerning for ischemia  Borderline abnormal stress test  Diabetes  Implantable defibrillator-Medtronic  Sleep disorder breathing  The patient has concerning chest discomfort. This is progressive over the last year. With borderline negative abnormal stress test a year ago we'll undertake catheterization.  Right now we'll continue him on his current medications.  It is his impression that while he has sleep disordered breathing he does not have daytime somnolence ;  Is not interested  in pursuing a sleep study.  He is working on his diabetes drop his A1c by weight loss. He is to be congratulated

## 2013-11-29 NOTE — Patient Instructions (Addendum)
Your physician recommends that you continue on your current medications as directed. Please refer to the Current Medication list given to you today.  Your physician recommends that you return for pre-procedure lab work on: 12/05/13   Your physician has requested that you have a cardiac catheterization. Cardiac catheterization is used to diagnose and/or treat various heart conditions. Doctors may recommend this procedure for a number of different reasons. The most common reason is to evaluate chest pain. Chest pain can be a symptom of coronary artery disease (CAD), and cardiac catheterization can show whether plaque is narrowing or blocking your heart's arteries. This procedure is also used to evaluate the valves, as well as measure the blood flow and oxygen levels in different parts of your heart. For further information please visit [email or web address]. Please follow instruction sheet, as given.  Remote monitoring is used to monitor your ICD from home. This monitoring reduces the number of office visits required to check your device to one time per year. It allows [place] to keep an eye on the functioning of your device to ensure it is working properly. You are scheduled for a device check from home on 03-04-2014. You may send your transmission at any time that day. If you have a wireless device, the transmission will be sent automatically. After your physician reviews your transmission, you will receive a postcard with your next transmission date.  Your physician recommends that you schedule a follow-up appointment in: 12 months with Dr.Klein

## 2013-12-03 ENCOUNTER — Encounter (HOSPITAL_COMMUNITY): Payer: Self-pay | Admitting: Pharmacy Technician

## 2013-12-03 ENCOUNTER — Other Ambulatory Visit: Payer: Self-pay | Admitting: Interventional Cardiology

## 2013-12-05 ENCOUNTER — Other Ambulatory Visit (INDEPENDENT_AMBULATORY_CARE_PROVIDER_SITE_OTHER): Payer: BC Managed Care – PPO

## 2013-12-05 DIAGNOSIS — Z01812 Encounter for preprocedural laboratory examination: Secondary | ICD-10-CM

## 2013-12-05 DIAGNOSIS — R079 Chest pain, unspecified: Secondary | ICD-10-CM

## 2013-12-05 LAB — CBC WITH DIFFERENTIAL/PLATELET
Basophils Absolute: 0 10*3/uL (ref 0.0–0.1)
Basophils Relative: 0.4 % (ref 0.0–3.0)
EOS ABS: 0.2 10*3/uL (ref 0.0–0.7)
EOS PCT: 3.7 % (ref 0.0–5.0)
HEMATOCRIT: 45.6 % (ref 39.0–52.0)
Hemoglobin: 15.2 g/dL (ref 13.0–17.0)
LYMPHS ABS: 1.4 10*3/uL (ref 0.7–4.0)
Lymphocytes Relative: 26 % (ref 12.0–46.0)
MCHC: 33.4 g/dL (ref 30.0–36.0)
MCV: 87.3 fl (ref 78.0–100.0)
MONO ABS: 0.5 10*3/uL (ref 0.1–1.0)
Monocytes Relative: 9.6 % (ref 3.0–12.0)
Neutro Abs: 3.2 10*3/uL (ref 1.4–7.7)
Neutrophils Relative %: 60.3 % (ref 43.0–77.0)
Platelets: 167 10*3/uL (ref 150.0–400.0)
RBC: 5.23 Mil/uL (ref 4.22–5.81)
RDW: 13.9 % (ref 11.5–15.5)
WBC: 5.2 10*3/uL (ref 4.0–10.5)

## 2013-12-05 LAB — BASIC METABOLIC PANEL
BUN: 22 mg/dL (ref 6–23)
CO2: 26 mEq/L (ref 19–32)
Calcium: 9.2 mg/dL (ref 8.4–10.5)
Chloride: 106 mEq/L (ref 96–112)
Creatinine, Ser: 1.5 mg/dL (ref 0.4–1.5)
GFR: 50.4 mL/min — ABNORMAL LOW (ref >60.00)
Glucose, Bld: 182 mg/dL — ABNORMAL HIGH (ref 70–99)
POTASSIUM: 4.2 meq/L (ref 3.5–5.1)
Sodium: 138 mEq/L (ref 135–145)

## 2013-12-06 ENCOUNTER — Encounter: Payer: Self-pay | Admitting: Cardiovascular Disease

## 2013-12-11 ENCOUNTER — Ambulatory Visit (HOSPITAL_COMMUNITY)
Admission: RE | Admit: 2013-12-11 | Discharge: 2013-12-11 | Disposition: A | Discharge status: Home / Self Care | Payer: BC Managed Care – PPO | Source: Ambulatory Visit | Attending: Interventional Cardiology | Admitting: Interventional Cardiology

## 2013-12-11 ENCOUNTER — Encounter (HOSPITAL_COMMUNITY)
Admission: RE | Disposition: A | Discharge status: Home / Self Care | Payer: Self-pay | Source: Ambulatory Visit | Attending: Interventional Cardiology

## 2013-12-11 DIAGNOSIS — I5042 Chronic combined systolic (congestive) and diastolic (congestive) heart failure: Secondary | ICD-10-CM | POA: Diagnosis not present

## 2013-12-11 DIAGNOSIS — I509 Heart failure, unspecified: Secondary | ICD-10-CM | POA: Diagnosis not present

## 2013-12-11 DIAGNOSIS — E1149 Type 2 diabetes mellitus with other diabetic neurological complication: Secondary | ICD-10-CM | POA: Diagnosis not present

## 2013-12-11 DIAGNOSIS — R0989 Other specified symptoms and signs involving the circulatory and respiratory systems: Secondary | ICD-10-CM | POA: Insufficient documentation

## 2013-12-11 DIAGNOSIS — I428 Other cardiomyopathies: Secondary | ICD-10-CM

## 2013-12-11 DIAGNOSIS — I209 Angina pectoris, unspecified: Secondary | ICD-10-CM | POA: Insufficient documentation

## 2013-12-11 DIAGNOSIS — E1142 Type 2 diabetes mellitus with diabetic polyneuropathy: Secondary | ICD-10-CM | POA: Diagnosis not present

## 2013-12-11 DIAGNOSIS — Z95810 Presence of automatic (implantable) cardiac defibrillator: Secondary | ICD-10-CM

## 2013-12-11 DIAGNOSIS — Z4502 Encounter for adjustment and management of automatic implantable cardiac defibrillator: Secondary | ICD-10-CM | POA: Diagnosis not present

## 2013-12-11 DIAGNOSIS — R079 Chest pain, unspecified: Secondary | ICD-10-CM

## 2013-12-11 DIAGNOSIS — R42 Dizziness and giddiness: Secondary | ICD-10-CM | POA: Insufficient documentation

## 2013-12-11 DIAGNOSIS — R0609 Other forms of dyspnea: Secondary | ICD-10-CM | POA: Insufficient documentation

## 2013-12-11 HISTORY — PX: LEFT HEART CATHETERIZATION WITH CORONARY ANGIOGRAM: SHX5451

## 2013-12-11 LAB — PROTIME-INR
INR: 1.09 (ref 0.00–1.49)
PROTHROMBIN TIME: 14.1 s (ref 11.6–15.2)

## 2013-12-11 LAB — GLUCOSE, CAPILLARY: GLUCOSE-CAPILLARY: 114 mg/dL — AB (ref 70–99)

## 2013-12-11 SURGERY — LEFT HEART CATHETERIZATION WITH CORONARY ANGIOGRAM
Anesthesia: LOCAL

## 2013-12-11 MED ORDER — SODIUM CHLORIDE 0.9 % IJ SOLN: 3.0000 mL | Freq: Two times a day (BID) | INTRAMUSCULAR | Status: DC

## 2013-12-11 MED ORDER — ASPIRIN 81 MG PO CHEW: 81.0000 mg | CHEWABLE_TABLET | ORAL | Status: DC

## 2013-12-11 MED ORDER — OXYCODONE-ACETAMINOPHEN 5-325 MG PO TABS: 1.0000 | ORAL_TABLET | ORAL | Status: DC | PRN

## 2013-12-11 MED ORDER — ACETAMINOPHEN 325 MG PO TABS: 650.0000 mg | ORAL_TABLET | ORAL | Status: DC | PRN

## 2013-12-11 MED ORDER — ONDANSETRON HCL 4 MG/2ML IJ SOLN: 4.0000 mg | Freq: Four times a day (QID) | INTRAMUSCULAR | Status: DC | PRN

## 2013-12-11 MED ORDER — ASPIRIN 81 MG PO CHEW
CHEWABLE_TABLET | ORAL | Status: AC
  Filled 2013-12-11: qty 1

## 2013-12-11 MED ORDER — SODIUM CHLORIDE 0.9 % IV SOLN
INTRAVENOUS | Status: DC
  Administered 2013-12-11: 10:00:00 via INTRAVENOUS

## 2013-12-11 MED ORDER — SODIUM CHLORIDE 0.9 % IV SOLN: 250.0000 mL | INTRAVENOUS | Status: DC | PRN

## 2013-12-11 MED ORDER — HEPARIN (PORCINE) IN NACL 2-0.9 UNIT/ML-% IJ SOLN
INTRAMUSCULAR | Status: AC
  Filled 2013-12-11: qty 1000

## 2013-12-11 MED ORDER — SODIUM CHLORIDE 0.9 % IV SOLN: INTRAVENOUS | Status: AC

## 2013-12-11 MED ORDER — SODIUM CHLORIDE 0.9 % IJ SOLN: 3.0000 mL | INTRAMUSCULAR | Status: DC | PRN

## 2013-12-11 MED ORDER — VERAPAMIL HCL 2.5 MG/ML IV SOLN
INTRAVENOUS | Status: AC
  Filled 2013-12-11: qty 2

## 2013-12-11 MED ORDER — HEPARIN SODIUM (PORCINE) 1000 UNIT/ML IJ SOLN
INTRAMUSCULAR | Status: AC
  Filled 2013-12-11: qty 1

## 2013-12-11 MED ORDER — NITROGLYCERIN 1 MG/10 ML FOR IR/CATH LAB
INTRA_ARTERIAL | Status: AC
  Filled 2013-12-11: qty 10

## 2013-12-11 MED ORDER — MIDAZOLAM HCL 2 MG/2ML IJ SOLN
INTRAMUSCULAR | Status: AC
  Filled 2013-12-11: qty 2

## 2013-12-11 MED ORDER — FENTANYL CITRATE 0.05 MG/ML IJ SOLN
INTRAMUSCULAR | Status: AC
  Filled 2013-12-11: qty 2

## 2013-12-11 MED ORDER — LIDOCAINE HCL (PF) 1 % IJ SOLN
INTRAMUSCULAR | Status: AC
  Filled 2013-12-11: qty 30

## 2013-12-11 NOTE — Discharge Instructions (Signed)
Radial Site Care °Refer to this sheet in the next few weeks. These instructions provide you with information on caring for yourself after your procedure. Your caregiver may also give you more specific instructions. Your treatment has been planned according to current medical practices, but problems sometimes occur. Call your caregiver if you have any problems or questions after your procedure. °HOME CARE INSTRUCTIONS °· You may shower the day after the procedure. Remove the bandage (dressing) and gently wash the site with plain soap and water. Gently pat the site dry. °· Do not apply powder or lotion to the site. °· Do not submerge the affected site in water for 3 to 5 days. °· Inspect the site at least twice daily. °· Do not flex or bend the affected arm for 24 hours. °· No lifting over 5 pounds (2.3 kg) for 5 days after your procedure. °· Do not drive home if you are discharged the same day of the procedure. Have someone else drive you. °· You may drive 24 hours after the procedure unless otherwise instructed by your caregiver. °· Do not operate machinery or power tools for 24 hours. °· A responsible adult should be with you for the first 24 hours after you arrive home. °What to expect: °· Any bruising will usually fade within 1 to 2 weeks. °· Blood that collects in the tissue (hematoma) may be painful to the touch. It should usually decrease in size and tenderness within 1 to 2 weeks. °SEEK IMMEDIATE MEDICAL CARE IF: °· You have unusual pain at the radial site. °· You have redness, warmth, swelling, or pain at the radial site. °· You have drainage (other than a small amount of blood on the dressing). °· You have chills. °· You have a fever or persistent symptoms for more than 72 hours. °· You have a fever and your symptoms suddenly get worse. °· Your arm becomes pale, cool, tingly, or numb. °· You have heavy bleeding from the site. Hold pressure on the site. °Document Released: 04/24/2010 Document Revised:  06/14/2011 Document Reviewed: 04/24/2010 °Exit[employer]® Patient Information ©2015 [employer]. This information is not intended to replace advice given to you by your health care provider. Make sure you discuss any questions you have with your health care provider. ° °

## 2013-12-11 NOTE — CV Procedure (Signed)
     Left Heart Catheterization with Coronary Angiography  Report  Russell Fuentes  63 y.o.  male January 31, 1951  Procedure Date: 12/11/2013 Referring Physician: Jolyn Nap, M.D. Primary Cardiologist:: Jolyn Nap, M.D.  INDICATIONS: Exertional chest discomfort consistent with angina.  PROCEDURE: 1. Left heart catheterization; 2. Coronary angiography; 3. Left ventriculography  CONSENT:  The risks, benefits, and details of the procedure were explained in detail to the patient. Risks including death, stroke, heart attack, kidney injury, allergy, limb ischemia, bleeding and radiation injury were discussed.  The patient verbalized understanding and wanted to proceed.  Informed written consent was obtained.  PROCEDURE TECHNIQUE:  After Xylocaine anesthesia a 5 French Slender sheath was placed in the right radial artery with an angiocath and the modified Seldinger technique.  Coronary angiography was done using a 5 F JR 4 and JL 3.5 diagnostics 6s catheter.  Left ventriculography was done using the JR 4 catheter and hand injection.   Digital images were reviewed and the case was terminated.  Hemostasis was achieved with a right radial wrist band using 12 cc of air.   CONTRAST:  Total of 80 cc.  COMPLICATIONS:  None   HEMODYNAMICS:  Aortic pressure 92/56 mmHg; LV pressure 93/14 mmHg; LVEDP 16 mm mercury  ANGIOGRAPHIC DATA:   The left main coronary artery is normal.  The left anterior descending artery is normal. There is a large diagonal that arises proximally. The LAD wraps around the left ventricular apex.  The ramus intermedius is smooth, large, and normal.  The left circumflex artery is normal.  The right coronary artery is normal and dominant.   LEFT VENTRICULOGRAM:  Left ventricular angiogram was done in the 30 RAO projection and revealed mildly dilated with decreased contractility. Overall function is difficult to assess but appears to be in the 35-40%   IMPRESSIONS:  1.  Normal coronary arteries. 2. Left ventricular systolic dysfunction with EF 35-40%. Asynchronous wall motion is noted. Diastolic function is normal based upon hemodynamics. 3. Exertional and rest chest discomfort of uncertain etiology   RECOMMENDATION:  Consider gastrointestinal sources of pain. There is also the possibility of endothelial dysfunction. No obstructive coronary disease is noted.[name]

## 2013-12-11 NOTE — Interval H&P Note (Signed)
Cath Lab Visit (complete for each Cath Lab visit)  Clinical Evaluation Leading to the Procedure:   ACS: No.  Non-ACS:    Anginal Classification: CCS III  Anti-ischemic medical therapy: Minimal Therapy (1 class of medications)  Non-Invasive Test Results: Intermediate-risk stress test findings: cardiac mortality 1-3%/year  Prior CABG: No previous CABG      History and Physical Interval Note:  12/11/2013 9:27 AM  Russell Fuentes  has presented today for surgery, with the diagnosis of cp  The various methods of treatment have been discussed with the patient and family. After consideration of risks, benefits and other options for treatment, the patient has consented to  Procedure(s): LEFT HEART CATHETERIZATION WITH CORONARY ANGIOGRAM (N/A) as a surgical intervention .  The patient's history has been reviewed, patient examined, no change in status, stable for surgery.  I have reviewed the patient's chart and labs.  Questions were answered to the patient's satisfaction.     [name]

## 2013-12-11 NOTE — Interval H&P Note (Signed)
Cath Lab Visit (complete for each Cath Lab visit)  Clinical Evaluation Leading to the Procedure:   ACS: No.  Non-ACS:    Anginal Classification: CCS III  Anti-ischemic medical therapy: Minimal Therapy (1 class of medications)  Non-Invasive Test Results: No non-invasive testing performed  Prior CABG: No previous CABG      History and Physical Interval Note:  12/11/2013 10:56 AM  Russell Fuentes  has presented today for surgery, with the diagnosis of cp  The various methods of treatment have been discussed with the patient and family. After consideration of risks, benefits and other options for treatment, the patient has consented to  Procedure(s): LEFT HEART CATHETERIZATION WITH CORONARY ANGIOGRAM (N/A) as a surgical intervention .  The patient's history has been reviewed, patient examined, no change in status, stable for surgery.  I have reviewed the patient's chart and labs.  Questions were answered to the patient's satisfaction.     [name]

## 2013-12-11 NOTE — H&P (View-Only) (Signed)
Patient Care Team: [name], MD as PCP - General (Family Medicine)   HPI  Russell Fuentes is a 63 y.o. male Seen in followup for an ICD to CRT implanted for nonischemic cardiomyopathy. He also has a history of orthostasis   last year we saw him he was having complaints of dyspnea on exertion without chest discomfort. A Myoview was undertaken and demonstrated apical defect but no evidence of ischemia. This occurred in the context of previously known normal coronary arteries. Ejection fraction by echo 11/14 was normal  He comes in now with an 3 - 4 month history of progressive and increasingly severe exertional chest tightness relieved by rest  And is associated with dyspnea.  Most recently his hemoglobin A1c was over 7. He has lost 10 pounds a month and his sugars are better. Uses in addition to his endocrinologist is following his potassium levels and he will have been forwarded to [place]   Past Medical History  Diagnosis Date  . Chronic combined systolic and diastolic heart failure 79/11/9209  . Nonischemic cardiomyopathy 07/16/2009    Qualifier: Diagnosis of  By: Mack Guise    . Orthostatic lightheadedness 01/08/2013  . 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 Diabetes mellitus with neuropathy 01/08/2013    Past Surgical History  Procedure Laterality Date  . Coronary artery bypass graft      Current Outpatient Prescriptions  Medication Sig Dispense Refill  . carvedilol (COREG) 25 MG tablet Take 1 tablet (25 mg total) by mouth 2 (two) times daily.  180 tablet  1  . Empagliflozin-Linagliptin (GLYXAMBI) 25-5 MG TABS Take 1 tablet by mouth daily.      . furosemide (LASIX) 20 MG tablet Take 1 tablet (20 mg total) by mouth daily.  30 tablet  3  . glimepiride (AMARYL) 4 MG tablet Take 4 mg by mouth daily before breakfast.      . lovastatin (MEVACOR) 20 MG tablet Take 20 mg by mouth daily.      [name] spironolactone (ALDACTONE) 25  MG tablet TAKE ONE-HALF TABLET BY MOUTH EVERY DAY  30 tablet  0  . valsartan (DIOVAN) 160 MG tablet TAKE 1 TABLET BY MOUTH EVERY DAY  30 tablet  3   No current facility-administered medications for this visit.    Allergies  Allergen Reactions  . Codeine     Review of Systems negative except from HPI and PMH  Physical Exam BP 104/72  Pulse 76  Ht 5\' 11"  (1.803 m)  Wt 264 lb 3.2 oz (119.84 kg)  BMI 36.86 kg/m2 Well developed and well nourished in no acute distress HENT normal E scleral and icterus clear Neck Supple JVP flat; carotids brisk and full Clear to ausculation  Regular rate and rhythm, no murmurs gallops or rub Soft with active bowel sounds No clubbing cyanosis  Edema Alert and oriented, grossly normal motor and sensory function Skin Warm and Dry  ECG demonstrates P. synchronous biventricular pacing   Assessment and  Plan  Nonischemic cardiomyopathy-intercurrently resolved  Chest pain concerning for ischemia  Borderline abnormal stress test  Diabetes  Implantable defibrillator-Medtronic  Sleep disorder breathing  The patient has concerning chest discomfort. This is progressive over the last year. With borderline negative abnormal stress test a year ago we'll undertake catheterization.  Right now we'll continue him on his current medications.  It is his impression that while he has sleep disordered breathing he does not have daytime somnolence ;  Is not interested  in pursuing a sleep study.  He is working on his diabetes drop his A1c by weight loss. He is to be congratulated

## 2014-01-16 ENCOUNTER — Other Ambulatory Visit: Payer: Self-pay | Admitting: Internal Medicine

## 2014-01-21 ENCOUNTER — Other Ambulatory Visit: Payer: Self-pay | Admitting: Internal Medicine

## 2014-02-25 ENCOUNTER — Other Ambulatory Visit: Payer: Self-pay | Admitting: Internal Medicine

## 2014-03-04 ENCOUNTER — Encounter: Payer: Self-pay | Admitting: Internal Medicine

## 2014-03-04 ENCOUNTER — Ambulatory Visit (INDEPENDENT_AMBULATORY_CARE_PROVIDER_SITE_OTHER): Payer: BC Managed Care – PPO | Admitting: *Deleted

## 2014-03-04 DIAGNOSIS — I428 Other cardiomyopathies: Secondary | ICD-10-CM

## 2014-03-04 DIAGNOSIS — I429 Cardiomyopathy, unspecified: Secondary | ICD-10-CM

## 2014-03-04 DIAGNOSIS — I5042 Chronic combined systolic (congestive) and diastolic (congestive) heart failure: Secondary | ICD-10-CM

## 2014-03-04 LAB — MDC_IDC_ENUM_SESS_TYPE_REMOTE
Brady Statistic AP VP Percent: 48.25 %
Brady Statistic AS VP Percent: 50.32 %
Brady Statistic AS VS Percent: 1.36 %
HighPow Impedance: 49 Ohm
HighPow Impedance: 59 Ohm
Lead Channel Impedance Value: 4047 Ohm
Lead Channel Impedance Value: 494 Ohm
Lead Channel Impedance Value: 589 Ohm
Lead Channel Pacing Threshold Amplitude: 0.625 V
Lead Channel Pacing Threshold Amplitude: 2.125 V
Lead Channel Pacing Threshold Pulse Width: 0.4 ms
Lead Channel Pacing Threshold Pulse Width: 0.4 ms
Lead Channel Sensing Intrinsic Amplitude: 2.625 mV
Lead Channel Sensing Intrinsic Amplitude: 2.625 mV
Lead Channel Sensing Intrinsic Amplitude: 22.875 mV
Lead Channel Setting Pacing Amplitude: 2.5 V
Lead Channel Setting Pacing Pulse Width: 0.4 ms
Lead Channel Setting Sensing Sensitivity: 0.6 mV
MDC IDC MSMT BATTERY VOLTAGE: 2.64 V
MDC IDC MSMT LEADCHNL LV IMPEDANCE VALUE: 4047 Ohm
MDC IDC MSMT LEADCHNL LV PACING THRESHOLD PULSEWIDTH: 0.4 ms
MDC IDC MSMT LEADCHNL RV IMPEDANCE VALUE: 532 Ohm
MDC IDC MSMT LEADCHNL RV PACING THRESHOLD AMPLITUDE: 1.25 V
MDC IDC MSMT LEADCHNL RV SENSING INTR AMPL: 22.875 mV
MDC IDC SESS DTM: 20151130141719
MDC IDC SET LEADCHNL LV PACING AMPLITUDE: 2.75 V
MDC IDC SET LEADCHNL LV PACING PULSEWIDTH: 0.4 ms
MDC IDC SET LEADCHNL RA PACING AMPLITUDE: 2 V
MDC IDC SET ZONE DETECTION INTERVAL: 360 ms
MDC IDC STAT BRADY AP VS PERCENT: 0.07 %
MDC IDC STAT BRADY RA PERCENT PACED: 48.32 %
MDC IDC STAT BRADY RV PERCENT PACED: 98.57 %
Zone Setting Detection Interval: 320 ms
Zone Setting Detection Interval: 350 ms
Zone Setting Detection Interval: 450 ms

## 2014-03-05 NOTE — Progress Notes (Signed)
Remote ICD transmission.   

## 2014-03-14 ENCOUNTER — Encounter (HOSPITAL_COMMUNITY): Payer: Self-pay | Admitting: Interventional Cardiology

## 2014-04-11 ENCOUNTER — Encounter: Payer: Self-pay | Admitting: Cardiology

## 2014-04-24 ENCOUNTER — Other Ambulatory Visit: Payer: Self-pay | Admitting: Internal Medicine

## 2014-06-03 ENCOUNTER — Ambulatory Visit (INDEPENDENT_AMBULATORY_CARE_PROVIDER_SITE_OTHER): Payer: BC Managed Care – PPO | Admitting: *Deleted

## 2014-06-03 DIAGNOSIS — I429 Cardiomyopathy, unspecified: Secondary | ICD-10-CM

## 2014-06-03 DIAGNOSIS — I5042 Chronic combined systolic (congestive) and diastolic (congestive) heart failure: Secondary | ICD-10-CM

## 2014-06-03 DIAGNOSIS — I428 Other cardiomyopathies: Secondary | ICD-10-CM

## 2014-06-03 LAB — MDC_IDC_ENUM_SESS_TYPE_REMOTE
Battery Voltage: 2.62 V
Brady Statistic AP VP Percent: 51.54 %
Brady Statistic AP VS Percent: 0.06 %
Brady Statistic AS VP Percent: 48.01 %
Brady Statistic AS VS Percent: 0.38 %
Brady Statistic RA Percent Paced: 51.6 %
Date Time Interrogation Session: 20160229184329
HIGH POWER IMPEDANCE MEASURED VALUE: 56 Ohm
HighPow Impedance: 73 Ohm
Lead Channel Impedance Value: 4047 Ohm
Lead Channel Impedance Value: 4047 Ohm
Lead Channel Impedance Value: 494 Ohm
Lead Channel Impedance Value: 589 Ohm
Lead Channel Pacing Threshold Amplitude: 0.625 V
Lead Channel Pacing Threshold Amplitude: 0.875 V
Lead Channel Pacing Threshold Amplitude: 2.125 V
Lead Channel Pacing Threshold Pulse Width: 0.4 ms
Lead Channel Pacing Threshold Pulse Width: 0.4 ms
Lead Channel Sensing Intrinsic Amplitude: 22.5 mV
Lead Channel Sensing Intrinsic Amplitude: 3.25 mV
Lead Channel Setting Pacing Amplitude: 2 V
Lead Channel Setting Pacing Amplitude: 2.5 V
Lead Channel Setting Pacing Amplitude: 4.25 V
Lead Channel Setting Pacing Pulse Width: 0.4 ms
Lead Channel Setting Pacing Pulse Width: 0.4 ms
Lead Channel Setting Sensing Sensitivity: 0.6 mV
MDC IDC MSMT LEADCHNL RV IMPEDANCE VALUE: 551 Ohm
MDC IDC MSMT LEADCHNL RV PACING THRESHOLD PULSEWIDTH: 0.4 ms
MDC IDC STAT BRADY RV PERCENT PACED: 99.56 %
Zone Setting Detection Interval: 320 ms
Zone Setting Detection Interval: 350 ms
Zone Setting Detection Interval: 360 ms
Zone Setting Detection Interval: 450 ms

## 2014-06-03 NOTE — Progress Notes (Signed)
Remote ICD transmission.   

## 2014-06-06 ENCOUNTER — Telehealth: Payer: Self-pay | Admitting: Internal Medicine

## 2014-06-06 NOTE — Telephone Encounter (Signed)
New message      Returning someone from device clinic's call

## 2014-06-06 NOTE — Telephone Encounter (Signed)
Patient made aware of defibrillator battery status and monthly remote checks and monitor battery closely.

## 2014-07-08 ENCOUNTER — Ambulatory Visit (INDEPENDENT_AMBULATORY_CARE_PROVIDER_SITE_OTHER): Payer: Medicare Other | Admitting: *Deleted

## 2014-07-08 ENCOUNTER — Encounter: Payer: Self-pay | Admitting: Internal Medicine

## 2014-07-08 DIAGNOSIS — Z95810 Presence of automatic (implantable) cardiac defibrillator: Secondary | ICD-10-CM

## 2014-07-08 NOTE — Progress Notes (Signed)
Remote ICD transmission.   

## 2014-07-09 ENCOUNTER — Encounter: Payer: Self-pay | Admitting: Cardiology

## 2014-07-09 LAB — MDC_IDC_ENUM_SESS_TYPE_REMOTE
Battery Voltage: 2.62 V
Brady Statistic AP VP Percent: 44.88 %
Brady Statistic AP VS Percent: 0.06 %
Brady Statistic AS VP Percent: 54.64 %
Brady Statistic RA Percent Paced: 44.94 %
Brady Statistic RV Percent Paced: 99.52 %
HighPow Impedance: 51 Ohm
HighPow Impedance: 60 Ohm
Lead Channel Impedance Value: 4047 Ohm
Lead Channel Impedance Value: 494 Ohm
Lead Channel Impedance Value: 551 Ohm
Lead Channel Pacing Threshold Amplitude: 0.625 V
Lead Channel Pacing Threshold Amplitude: 1.25 V
Lead Channel Pacing Threshold Amplitude: 1.5 V
Lead Channel Pacing Threshold Pulse Width: 0.4 ms
Lead Channel Pacing Threshold Pulse Width: 0.4 ms
Lead Channel Pacing Threshold Pulse Width: 0.4 ms
Lead Channel Sensing Intrinsic Amplitude: 23.75 mV
Lead Channel Sensing Intrinsic Amplitude: 3.25 mV
Lead Channel Sensing Intrinsic Amplitude: 3.25 mV
Lead Channel Setting Pacing Amplitude: 2.75 V
Lead Channel Setting Pacing Amplitude: 3 V
Lead Channel Setting Pacing Pulse Width: 0.4 ms
Lead Channel Setting Pacing Pulse Width: 0.4 ms
Lead Channel Setting Sensing Sensitivity: 0.6 mV
MDC IDC MSMT LEADCHNL LV IMPEDANCE VALUE: 4047 Ohm
MDC IDC MSMT LEADCHNL LV IMPEDANCE VALUE: 589 Ohm
MDC IDC MSMT LEADCHNL RV SENSING INTR AMPL: 23.75 mV
MDC IDC SESS DTM: 20160404125508
MDC IDC SET LEADCHNL RA PACING AMPLITUDE: 2 V
MDC IDC SET ZONE DETECTION INTERVAL: 350 ms
MDC IDC SET ZONE DETECTION INTERVAL: 360 ms
MDC IDC STAT BRADY AS VS PERCENT: 0.42 %
Zone Setting Detection Interval: 320 ms
Zone Setting Detection Interval: 450 ms

## 2014-07-16 ENCOUNTER — Other Ambulatory Visit: Payer: Self-pay | Admitting: Family Medicine

## 2014-07-16 ENCOUNTER — Ambulatory Visit
Admission: RE | Admit: 2014-07-16 | Discharge: 2014-07-16 | Disposition: A | Discharge status: Home / Self Care | Payer: Medicare Other | Source: Ambulatory Visit | Attending: Family Medicine | Admitting: Family Medicine

## 2014-07-16 DIAGNOSIS — E782 Mixed hyperlipidemia: Secondary | ICD-10-CM | POA: Diagnosis not present

## 2014-07-16 DIAGNOSIS — R05 Cough: Secondary | ICD-10-CM | POA: Diagnosis not present

## 2014-07-16 DIAGNOSIS — N183 Chronic kidney disease, stage 3 (moderate): Secondary | ICD-10-CM | POA: Diagnosis not present

## 2014-07-16 DIAGNOSIS — I429 Cardiomyopathy, unspecified: Secondary | ICD-10-CM | POA: Diagnosis not present

## 2014-07-16 DIAGNOSIS — I5022 Chronic systolic (congestive) heart failure: Secondary | ICD-10-CM | POA: Diagnosis not present

## 2014-07-16 DIAGNOSIS — I130 Hypertensive heart and chronic kidney disease with heart failure and stage 1 through stage 4 chronic kidney disease, or unspecified chronic kidney disease: Secondary | ICD-10-CM | POA: Diagnosis not present

## 2014-07-16 DIAGNOSIS — E1122 Type 2 diabetes mellitus with diabetic chronic kidney disease: Secondary | ICD-10-CM | POA: Diagnosis not present

## 2014-07-16 DIAGNOSIS — R059 Cough, unspecified: Secondary | ICD-10-CM

## 2014-07-16 DIAGNOSIS — Z6837 Body mass index (BMI) 37.0-37.9, adult: Secondary | ICD-10-CM | POA: Diagnosis not present

## 2014-07-17 ENCOUNTER — Encounter: Payer: Self-pay | Admitting: Internal Medicine

## 2014-07-31 DIAGNOSIS — S83412A Sprain of medial collateral ligament of left knee, initial encounter: Secondary | ICD-10-CM | POA: Diagnosis not present

## 2014-08-08 ENCOUNTER — Ambulatory Visit (INDEPENDENT_AMBULATORY_CARE_PROVIDER_SITE_OTHER): Payer: Medicare Other | Admitting: *Deleted

## 2014-08-13 ENCOUNTER — Encounter: Payer: Self-pay | Admitting: Cardiology

## 2014-08-13 LAB — CUP PACEART REMOTE DEVICE CHECK
Battery Voltage: 2.62 V
Brady Statistic AP VP Percent: 39.68 %
Brady Statistic AP VS Percent: 0.06 %
Brady Statistic AS VP Percent: 59.96 %
Brady Statistic RV Percent Paced: 99.64 %
HighPow Impedance: 50 Ohm
HighPow Impedance: 64 Ohm
Lead Channel Impedance Value: 4047 Ohm
Lead Channel Impedance Value: 4047 Ohm
Lead Channel Impedance Value: 437 Ohm
Lead Channel Impedance Value: 532 Ohm
Lead Channel Impedance Value: 684 Ohm
Lead Channel Pacing Threshold Amplitude: 0.625 V
Lead Channel Pacing Threshold Amplitude: 2.75 V
Lead Channel Pacing Threshold Pulse Width: 0.4 ms
Lead Channel Sensing Intrinsic Amplitude: 24 mV
Lead Channel Sensing Intrinsic Amplitude: 3.375 mV
Lead Channel Setting Pacing Amplitude: 2.5 V
Lead Channel Setting Pacing Amplitude: 3.25 V
Lead Channel Setting Pacing Pulse Width: 0.4 ms
Lead Channel Setting Pacing Pulse Width: 0.4 ms
Lead Channel Setting Sensing Sensitivity: 0.6 mV
MDC IDC MSMT LEADCHNL LV PACING THRESHOLD PULSEWIDTH: 0.4 ms
MDC IDC MSMT LEADCHNL RA SENSING INTR AMPL: 3.375 mV
MDC IDC MSMT LEADCHNL RV PACING THRESHOLD AMPLITUDE: 1.125 V
MDC IDC MSMT LEADCHNL RV PACING THRESHOLD PULSEWIDTH: 0.4 ms
MDC IDC MSMT LEADCHNL RV SENSING INTR AMPL: 24 mV
MDC IDC SESS DTM: 20160505085753
MDC IDC SET LEADCHNL RA PACING AMPLITUDE: 2 V
MDC IDC SET ZONE DETECTION INTERVAL: 360 ms
MDC IDC SET ZONE DETECTION INTERVAL: 450 ms
MDC IDC STAT BRADY AS VS PERCENT: 0.3 %
MDC IDC STAT BRADY RA PERCENT PACED: 39.74 %
Zone Setting Detection Interval: 320 ms
Zone Setting Detection Interval: 350 ms

## 2014-08-13 NOTE — Addendum Note (Signed)
Addended by: Kendell Bane on: 08/13/2014 04:51 PM   Modules accepted: Level of Service

## 2014-08-20 DIAGNOSIS — M25562 Pain in left knee: Secondary | ICD-10-CM | POA: Diagnosis not present

## 2014-08-22 ENCOUNTER — Telehealth: Payer: Self-pay | Admitting: Cardiology

## 2014-08-22 NOTE — Telephone Encounter (Signed)
Informed pt that he has reached ERI and I will have a scheduler to call him an schedule an appt to come into office and see MD. Pt verbalized understanding.

## 2014-08-25 ENCOUNTER — Telehealth: Payer: Self-pay | Admitting: Physician Assistant

## 2014-08-25 NOTE — Telephone Encounter (Addendum)
Patient called Sunday answering service. Around 9am his device rang out a brief alarm for about 30 seconds - beeping. No ICD shock. No symptoms. Device recently noted to be at [facility]. I d/w Dr. Rayann Heman and confirmed that is what this alarm most likely indicates. Patient states he was waiting for Melissa to call him with an appt to get into see Dr. [name]. Will forward to Dr. [name], Lenna Sciara, and Amber. I asked the patient to call the office tomorrow to check on status of appointment if he had not heard back. Patient verbalized understanding and gratitude.  Canyon Lohr PA-C

## 2014-08-26 ENCOUNTER — Telehealth: Payer: Self-pay | Admitting: Internal Medicine

## 2014-08-26 ENCOUNTER — Encounter: Payer: Self-pay | Admitting: Cardiology

## 2014-08-26 NOTE — Telephone Encounter (Signed)
New message       1. Has your device fired?yes  2. Is you device beeping? no  3. Are you experiencing draining or swelling at device site? no  4. Are you calling to see if we received your device transmission? no  5. Have you passed out? no

## 2014-08-26 NOTE — Telephone Encounter (Signed)
Pt was not shocked he was calling b/c his alarm due to ERI alarm going off. Pt is aware of appt on Wednesday 08-28-14 at 4:00 PM w/ Luetta Nutting, NP.

## 2014-08-27 ENCOUNTER — Encounter: Payer: Self-pay | Admitting: Nurse Practitioner

## 2014-08-27 NOTE — Progress Notes (Signed)
Electrophysiology Office Note Date: 08/28/2014  ID:  Russell Fuentes, DOB [birth year], MRN [id number]  PCP: [name], MD Electrophysiologist: [name]  CC: ICD at [place] is a 64 y.o. male is seen today for Dr [name].  He had a CRTD implanted 2006 for NICM and CHF and underwent generator change 2011.  He initially had recovery of his LV function and underwent cardiac catheterization in the fall of last year which demonstrated no CAD and EF 35-40%.  His ICD has recently been found to have reached ERI and he presents today for discussion of generator change.   Since last being seen in our clinic, the patient reports doing very well. He denies chest pain, palpitations, dyspnea, PND, orthopnea, nausea, vomiting, dizziness, syncope, edema, weight gain, or early satiety.  He has not had ICD shocks.   Device History: MDT CRTD implanted 2006 for NICM and CHF; gen change 2011 with addition of new RV lead (previously implanted [id number] lead abandoned) History of appropriate therapy: No History of AAD therapy: No   Past Medical History  Diagnosis Date  . Chronic combined systolic and diastolic heart failure    . Nonischemic cardiomyopathy      a. s/p MDT CRTD  . Orthostatic lightheadedness    . Diabetes mellitus with neuropathy     Past Surgical History  Procedure Laterality Date  . Coronary artery bypass graft    . Left heart catheterization with coronary angiogram N/A 12/11/2013    Procedure: LEFT HEART CATHETERIZATION WITH CORONARY ANGIOGRAM;  Surgeon: [name], MD;  Location[facility] CATH LAB;  Service: Cardiovascular;  Laterality: N/A;    Current Outpatient Prescriptions  Medication Sig Dispense Refill  . aspirin 81 MG tablet Take 81 mg by mouth daily.    . carvedilol (COREG) 25 MG tablet TAKE 1 TABLET BY MOUTH TWICE DAILY 180 tablet 1  . diclofenac (CATAFLAM) 50 MG tablet Take 1 tablet by mouth 2 (two) times daily.    [name] glimepiride (AMARYL) 4 MG tablet Take 4 mg by  mouth daily.     [name] lovastatin (MEVACOR) 20 MG tablet Take 20 mg by mouth daily.    [name] spironolactone (ALDACTONE) 25 MG tablet TAKE ONE-HALF TABLET BY MOUTH ONCE A DAY 30 tablet 10  . valsartan (DIOVAN) 160 MG tablet TAKE 1 TABLET BY MOUTH EVERY DAY 30 tablet 9   No current facility-administered medications for this visit.    Allergies:   Codeine   Social History: History   Social History  . Marital Status: Married    Spouse Name: N/A  . Number of Children: N/A  . Years of Education: N/A   Occupational History  . Not on file.   Social History Main Topics  . Smoking status: Never Smoker   . Smokeless tobacco: Not on file  . Alcohol Use: Not on file  . Drug Use: Not on file  . Sexual Activity: Not on file   Other Topics Concern  . Not on file   Social History Narrative    Family History: Family History  Problem Relation Age of Onset  . Asthma Sister   . Asthma Brother   . Heart attack Father   . Heart attack Sister   . Cancer Mother   . Cancer Sister     Review of Systems: All other systems reviewed and are otherwise negative except as noted above.   Physical Exam: VS:  BP 132/76 mmHg  Pulse 86  Ht 5'  11" (1.803 m)  Wt 262 lb (118.842 kg)  BMI 36.56 kg/m2  SpO2 95% , BMI Body mass index is 36.56 kg/(m^2).  GEN- The patient is obese appearing, alert and oriented x 3 today.   HEENT: normocephalic, atraumatic; sclera clear, conjunctiva pink; hearing intact; oropharynx clear; neck supple, no JVP Lymph- no cervical lymphadenopathy Lungs- Clear to ausculation bilaterally, normal work of breathing.  No wheezes, rales, rhonchi Heart- Regular rate and rhythm, no murmurs, rubs or gallops GI- soft, non-tender, non-distended, bowel sounds present Extremities- no clubbing, cyanosis, or edema; DP/PT/radial pulses 2+ bilaterally MS- no significant deformity or atrophy Skin- warm and dry, no rash or lesion; ICD pocket well healed Psych- euthymic mood, full  affect Neuro- strength and sensation are intact  ICD interrogation- reviewed in detail today,  See PACEART report  EKG:  EKG is ordered today. The ekg ordered today shows sinus rhythm with ventricular pacing  Recent Labs: 12/05/2013: BUN 22; Creatinine 1.5; Hemoglobin 15.2; Platelets 167.0; Potassium 4.2; Sodium 138   Wt Readings from Last 3 Encounters:  08/28/14 262 lb (118.842 kg)  12/11/13 255 lb (115.667 kg)  11/29/13 264 lb 3.2 oz (119.84 kg)     Other studies Reviewed: Additional studies/ records that were reviewed today include: Dr Olin Pia office note, cardiac catheterization report  Assessment and Plan:  1.  Chronic systolic dysfunction euvolemic today Stable on an appropriate medical regimen ICD at [facility] - pt with persistently depressed EF by cath last fall despite guideline directed therapy.  Risks, benefits to generator change were discussed with the patient who wishes to proceed. Add ASA 81mg  daily  See Pace Art report No changes today  2.  Diabetes Management per PCP Continue ARB  Current medicines are reviewed at length with the patient today.   The patient does not have concerns regarding his medicines.  The following changes were made today:  Add ASA 81mg  daily  Labs/ tests ordered today include: CBC,BMET   Disposition:   Follow up with Dr [name] after generator change    Signed, Chanetta Marshall, NP 08/28/2014 2:27 PM  [place] [address] [place] [address] [phone] (office) [phone] (fax)

## 2014-08-28 ENCOUNTER — Encounter: Payer: Self-pay | Admitting: Nurse Practitioner

## 2014-08-28 ENCOUNTER — Other Ambulatory Visit: Payer: Self-pay | Admitting: Internal Medicine

## 2014-08-28 ENCOUNTER — Ambulatory Visit (INDEPENDENT_AMBULATORY_CARE_PROVIDER_SITE_OTHER): Payer: Medicare Other | Admitting: Nurse Practitioner

## 2014-08-28 ENCOUNTER — Encounter: Payer: Self-pay | Admitting: *Deleted

## 2014-08-28 VITALS — BP 132/76 | HR 86 | Ht 71.0 in | Wt 262.0 lb

## 2014-08-28 DIAGNOSIS — I5042 Chronic combined systolic (congestive) and diastolic (congestive) heart failure: Secondary | ICD-10-CM | POA: Diagnosis not present

## 2014-08-28 DIAGNOSIS — Z95810 Presence of automatic (implantable) cardiac defibrillator: Secondary | ICD-10-CM | POA: Diagnosis not present

## 2014-08-28 DIAGNOSIS — I428 Other cardiomyopathies: Secondary | ICD-10-CM

## 2014-08-28 DIAGNOSIS — I429 Cardiomyopathy, unspecified: Secondary | ICD-10-CM | POA: Diagnosis not present

## 2014-08-28 LAB — CUP PACEART INCLINIC DEVICE CHECK
Date Time Interrogation Session: 20160605111937
Lead Channel Setting Pacing Amplitude: 2.5 V
Lead Channel Setting Pacing Pulse Width: 0.4 ms
MDC IDC SET LEADCHNL LV PACING PULSEWIDTH: 0.4 ms
MDC IDC SET LEADCHNL RA PACING AMPLITUDE: 2 V
MDC IDC SET LEADCHNL RV PACING AMPLITUDE: 2.5 V
MDC IDC SET LEADCHNL RV SENSING SENSITIVITY: 0.6 mV
Zone Setting Detection Interval: 320 ms
Zone Setting Detection Interval: 350 ms
Zone Setting Detection Interval: 360 ms
Zone Setting Detection Interval: 450 ms

## 2014-08-28 NOTE — Patient Instructions (Signed)
Medication Instructions:   MAKE SURE YOU TAKE A BABY ASPIRIN EVERY DAY ONCE   Labwork:  CBC AND BMET ON 09/10/14   Testing/Procedures:  YOUR SET UP FOR GENERATOR CHANGE ON 6*13* 16 LETTER ENCLOSED   Follow-Up:  WOUND CHECK 10 TO 14 DAYS AFTER PROCEDURE THE WEEK OF 09/29/14   Any Other Special Instructions Will Be Listed Below (If Applicable).

## 2014-09-04 ENCOUNTER — Encounter: Payer: Self-pay | Admitting: Internal Medicine

## 2014-09-06 DIAGNOSIS — S61411A Laceration without foreign body of right hand, initial encounter: Secondary | ICD-10-CM | POA: Diagnosis not present

## 2014-09-10 ENCOUNTER — Other Ambulatory Visit (INDEPENDENT_AMBULATORY_CARE_PROVIDER_SITE_OTHER): Payer: Medicare Other

## 2014-09-10 DIAGNOSIS — I5042 Chronic combined systolic (congestive) and diastolic (congestive) heart failure: Secondary | ICD-10-CM

## 2014-09-10 LAB — BASIC METABOLIC PANEL
BUN: 13 mg/dL (ref 6–23)
CHLORIDE: 102 meq/L (ref 96–112)
CO2: 30 mEq/L (ref 19–32)
Calcium: 9.2 mg/dL (ref 8.4–10.5)
Creatinine, Ser: 1.19 mg/dL (ref 0.40–1.50)
GFR: 65.18 mL/min (ref >60.00)
Glucose, Bld: 246 mg/dL — ABNORMAL HIGH (ref 70–99)
Potassium: 4.6 mEq/L (ref 3.5–5.1)
SODIUM: 136 meq/L (ref 135–145)

## 2014-09-10 LAB — CBC
HEMATOCRIT: 45.5 % (ref 39.0–52.0)
Hemoglobin: 15.5 g/dL (ref 13.0–17.0)
MCHC: 34 g/dL (ref 30.0–36.0)
MCV: 86.4 fl (ref 78.0–100.0)
Platelets: 177 10*3/uL (ref 150.0–400.0)
RBC: 5.27 Mil/uL (ref 4.22–5.81)
RDW: 13.9 % (ref 11.5–15.5)
WBC: 5.8 10*3/uL (ref 4.0–10.5)

## 2014-09-16 ENCOUNTER — Ambulatory Visit (HOSPITAL_COMMUNITY)
Admission: RE | Admit: 2014-09-16 | Discharge: 2014-09-16 | Disposition: A | Discharge status: Home / Self Care | Payer: Medicare Other | Source: Ambulatory Visit | Attending: Internal Medicine | Admitting: Internal Medicine

## 2014-09-16 ENCOUNTER — Encounter (HOSPITAL_COMMUNITY)
Admission: RE | Disposition: A | Discharge status: Home / Self Care | Payer: Medicare Other | Source: Ambulatory Visit | Attending: Internal Medicine

## 2014-09-16 ENCOUNTER — Encounter (HOSPITAL_COMMUNITY): Payer: Self-pay | Admitting: Internal Medicine

## 2014-09-16 DIAGNOSIS — I428 Other cardiomyopathies: Secondary | ICD-10-CM

## 2014-09-16 DIAGNOSIS — Z951 Presence of aortocoronary bypass graft: Secondary | ICD-10-CM | POA: Diagnosis not present

## 2014-09-16 DIAGNOSIS — I5042 Chronic combined systolic (congestive) and diastolic (congestive) heart failure: Secondary | ICD-10-CM | POA: Insufficient documentation

## 2014-09-16 DIAGNOSIS — Z8249 Family history of ischemic heart disease and other diseases of the circulatory system: Secondary | ICD-10-CM | POA: Insufficient documentation

## 2014-09-16 DIAGNOSIS — I420 Dilated cardiomyopathy: Secondary | ICD-10-CM | POA: Diagnosis not present

## 2014-09-16 DIAGNOSIS — E669 Obesity, unspecified: Secondary | ICD-10-CM | POA: Diagnosis not present

## 2014-09-16 DIAGNOSIS — E1140 Type 2 diabetes mellitus with diabetic neuropathy, unspecified: Secondary | ICD-10-CM | POA: Diagnosis not present

## 2014-09-16 DIAGNOSIS — Z4502 Encounter for adjustment and management of automatic implantable cardiac defibrillator: Secondary | ICD-10-CM | POA: Diagnosis not present

## 2014-09-16 DIAGNOSIS — Z6836 Body mass index (BMI) 36.0-36.9, adult: Secondary | ICD-10-CM | POA: Diagnosis not present

## 2014-09-16 DIAGNOSIS — Z95810 Presence of automatic (implantable) cardiac defibrillator: Secondary | ICD-10-CM | POA: Diagnosis present

## 2014-09-16 DIAGNOSIS — G4733 Obstructive sleep apnea (adult) (pediatric): Secondary | ICD-10-CM | POA: Diagnosis not present

## 2014-09-16 DIAGNOSIS — I429 Cardiomyopathy, unspecified: Secondary | ICD-10-CM | POA: Diagnosis not present

## 2014-09-16 DIAGNOSIS — Z7982 Long term (current) use of aspirin: Secondary | ICD-10-CM | POA: Diagnosis not present

## 2014-09-16 HISTORY — PX: EP IMPLANTABLE DEVICE: SHX172B

## 2014-09-16 LAB — SURGICAL PCR SCREEN
MRSA, PCR: NEGATIVE
Staphylococcus aureus: NEGATIVE

## 2014-09-16 LAB — GLUCOSE, CAPILLARY: Glucose-Capillary: 158 mg/dL — ABNORMAL HIGH (ref 65–99)

## 2014-09-16 SURGERY — ICD/BIV ICD GENERATOR CHANGEOUT
Anesthesia: LOCAL

## 2014-09-16 MED ORDER — SODIUM CHLORIDE 0.9 % IR SOLN
Status: AC
  Administered 2014-09-16: 09:00:00

## 2014-09-16 MED ORDER — MIDAZOLAM HCL 5 MG/5ML IJ SOLN
INTRAMUSCULAR | Status: AC
  Filled 2014-09-16: qty 5

## 2014-09-16 MED ORDER — DEXTROSE 5 % IV SOLN
2.0000 g | INTRAVENOUS | Status: DC | PRN
  Administered 2014-09-16: 2 g via INTRAVENOUS

## 2014-09-16 MED ORDER — SODIUM CHLORIDE 0.9 % IV SOLN
INTRAVENOUS | Status: AC
Start: 2014-09-16 — End: 2014-09-16

## 2014-09-16 MED ORDER — SODIUM CHLORIDE 0.9 % IR SOLN
Status: AC
  Filled 2014-09-16: qty 2

## 2014-09-16 MED ORDER — LIDOCAINE HCL (PF) 1 % IJ SOLN
INTRAMUSCULAR | Status: DC | PRN
  Administered 2014-09-16: 45 mL via INTRADERMAL

## 2014-09-16 MED ORDER — SODIUM CHLORIDE 0.9 % IR SOLN
80.0000 mg | Status: DC
  Filled 2014-09-16: qty 2

## 2014-09-16 MED ORDER — CEFAZOLIN SODIUM-DEXTROSE 2-3 GM-% IV SOLR
INTRAVENOUS | Status: AC
Start: 2014-09-16 — End: 2014-09-16
  Filled 2014-09-16: qty 50

## 2014-09-16 MED ORDER — ONDANSETRON HCL 4 MG/2ML IJ SOLN: 4.0000 mg | Freq: Four times a day (QID) | INTRAMUSCULAR | Status: DC | PRN

## 2014-09-16 MED ORDER — CHLORHEXIDINE GLUCONATE 4 % EX LIQD
60.0000 mL | Freq: Once | CUTANEOUS | Status: DC
  Filled 2014-09-16: qty 60

## 2014-09-16 MED ORDER — CEFAZOLIN SODIUM-DEXTROSE 2-3 GM-% IV SOLR: 2.0000 g | INTRAVENOUS | Status: AC

## 2014-09-16 MED ORDER — FENTANYL CITRATE (PF) 100 MCG/2ML IJ SOLN
INTRAMUSCULAR | Status: DC | PRN
  Administered 2014-09-16 (×2): 25 ug via INTRAVENOUS
  Administered 2014-09-16: 50 ug via INTRAVENOUS
  Administered 2014-09-16: 25 ug via INTRAVENOUS

## 2014-09-16 MED ORDER — MIDAZOLAM HCL 5 MG/5ML IJ SOLN
INTRAMUSCULAR | Status: DC | PRN
  Administered 2014-09-16 (×5): 1 mg via INTRAVENOUS

## 2014-09-16 MED ORDER — FENTANYL CITRATE (PF) 100 MCG/2ML IJ SOLN
INTRAMUSCULAR | Status: AC
  Filled 2014-09-16: qty 2

## 2014-09-16 MED ORDER — LIDOCAINE HCL (PF) 1 % IJ SOLN
INTRAMUSCULAR | Status: AC
  Filled 2014-09-16: qty 60

## 2014-09-16 MED ORDER — SODIUM CHLORIDE 0.9 % IV SOLN
INTRAVENOUS | Status: DC
  Administered 2014-09-16: 06:00:00 via INTRAVENOUS

## 2014-09-16 MED ORDER — ACETAMINOPHEN 325 MG PO TABS: 325.0000 mg | ORAL_TABLET | ORAL | Status: DC | PRN

## 2014-09-16 MED ORDER — MUPIROCIN 2 % EX OINT
1.0000 "application " | TOPICAL_OINTMENT | Freq: Once | CUTANEOUS | Status: AC
  Administered 2014-09-16: 1 via TOPICAL
  Filled 2014-09-16: qty 22

## 2014-09-16 MED ORDER — MUPIROCIN 2 % EX OINT
TOPICAL_OINTMENT | CUTANEOUS | Status: AC
  Administered 2014-09-16: 1 via TOPICAL
  Filled 2014-09-16: qty 22

## 2014-09-16 SURGICAL SUPPLY — 5 items
CABLE SURGICAL S-101-97-12 (CABLE) ×2 IMPLANT
HEMOSTAT SURGICEL 2X4 FIBR (HEMOSTASIS) IMPLANT
ICD VIVA XT CRT-D DTBA1D1 (ICD Generator) ×2 IMPLANT
PAD DEFIB LIFELINK (PAD) ×2 IMPLANT
TRAY PACEMAKER INSERTION (CUSTOM PROCEDURE TRAY) ×2 IMPLANT

## 2014-09-16 NOTE — Interval H&P Note (Signed)
ICD Criteria   Current LVEF:35% ;Obtained > 6 months ago.   NYHA Functional Classification: Class II  Heart Failure History:  Yes, Duration of heart failure since onset is > 9 months  Non-Ischemic Dilated Cardiomyopathy History:  Yes, timeframe is > 9 months  Atrial Fibrillation/Atrial Flutter:  No.  Ventricular Tachycardia History:  No.  Cardiac Arrest History:  No  History of Syndromes with Risk of Sudden Death:  No.  Previous ICD:  Yes, ICD Type:  CRT-D, Reason for ICD:  Primary prevention.  LVEF is not available  Electrophysiology Study: No.  Prior MI: No.  PPM: No.  OSA:  Yes  Patient Life Expectancy of >=1 year: Yes.  Anticoagulation Therapy:  Patient is NOT on anticoagulation therapy.   Beta Blocker Therapy:  Yes.   Ace Inhibitor/ARB Therapy:  Yes.History and Physical Interval Note:  09/16/2014 7:40 AM  Russell Fuentes  has presented today for surgery, with the diagnosis of ERI  The various methods of treatment have been discussed with the patient and family. After consideration of risks, benefits and other options for treatment, the patient has consented to  Procedure(s): ICD [employer] (N/A) as a surgical intervention .  The patient's history has been reviewed, patient examined, no change in status, stable for surgery.  I have reviewed the patient's chart and labs.  Questions were answered to the patient's satisfaction.     [name]

## 2014-09-16 NOTE — Discharge Instructions (Signed)
Pacemaker Battery Change, Care After °Refer to this sheet in the next few weeks. These instructions provide you with information on caring for yourself after your procedure. Your health care provider may also give you more specific instructions. Your treatment has been planned according to current medical practices, but problems sometimes occur. Call your health care provider if you have any problems or questions after your procedure. °WHAT TO EXPECT AFTER THE PROCEDURE °After your procedure, it is typical to have the following sensations: °· Soreness at the pacemaker site. °HOME CARE INSTRUCTIONS  °· Keep the incision clean and dry. °· Unless advised otherwise, you may shower beginning 48 hours after your procedure. °· For the first week after the replacement, avoid stretching motions that pull at the incision site, and avoid heavy exercise with the arm that is on the same side as the incision. °· Take medicines only as directed by your health care provider. °· Keep all follow-up visits as directed by your health care provider. °SEEK MEDICAL CARE IF:  °· You have pain at the incision site that is not relieved by over-the-counter or prescription medicine. °· There is drainage or pus from the incision site. °· There is swelling larger than a lime at the incision site. °· You develop red streaking that extends above or below the incision site. °· You feel brief, intermittent palpitations, light-headedness, or any symptoms that you feel might be related to your heart. °SEEK IMMEDIATE MEDICAL CARE IF:  °· You experience chest pain that is different than the pain at the pacemaker site. °· You experience shortness of breath. °· You have palpitations or irregular heartbeat. °· You have light-headedness that does not go away quickly. °· You faint. °· You have pain that gets worse and is not relieved by medicine. °Document Released: 01/10/2013 Document Revised: 08/06/2013 Document Reviewed: 01/10/2013 °[employer]® Patient  Information ©2015 [employer]. This information is not intended to replace advice given to you by your health care provider. Make sure you discuss any questions you have with your health care provider. ° °

## 2014-09-16 NOTE — H&P (View-Only) (Signed)
Electrophysiology Office Note Date: 08/28/2014  ID:  Russell Fuentes, DOB [birth year], MRN [id number]  PCP: [name], MD Electrophysiologist: [name]  CC: ICD at [place] is a 64 y.o. male is seen today for Dr [name].  He had a CRTD implanted 2006 for NICM and CHF and underwent generator change 2011.  He initially had recovery of his LV function and underwent cardiac catheterization in the fall of last year which demonstrated no CAD and EF 35-40%.  His ICD has recently been found to have reached ERI and he presents today for discussion of generator change.   Since last being seen in our clinic, the patient reports doing very well. He denies chest pain, palpitations, dyspnea, PND, orthopnea, nausea, vomiting, dizziness, syncope, edema, weight gain, or early satiety.  He has not had ICD shocks.   Device History: MDT CRTD implanted 2006 for NICM and CHF; gen change 2011 with addition of new RV lead (previously implanted [id number] lead abandoned) History of appropriate therapy: No History of AAD therapy: No   Past Medical History  Diagnosis Date  . Chronic combined systolic and diastolic heart failure    . Nonischemic cardiomyopathy      a. s/p MDT CRTD  . Orthostatic lightheadedness    . Diabetes mellitus with neuropathy     Past Surgical History  Procedure Laterality Date  . Coronary artery bypass graft    . Left heart catheterization with coronary angiogram N/A 12/11/2013    Procedure: LEFT HEART CATHETERIZATION WITH CORONARY ANGIOGRAM;  Surgeon: [name], MD;  Location: [facility] CATH LAB;  Service: Cardiovascular;  Laterality: N/A;    Current Outpatient Prescriptions  Medication Sig Dispense Refill  . aspirin 81 MG tablet Take 81 mg by mouth daily.    . carvedilol (COREG) 25 MG tablet TAKE 1 TABLET BY MOUTH TWICE DAILY 180 tablet 1  . diclofenac (CATAFLAM) 50 MG tablet Take 1 tablet by mouth 2 (two) times daily.    [name] glimepiride (AMARYL) 4 MG tablet Take 4 mg by  mouth daily.     [name] lovastatin (MEVACOR) 20 MG tablet Take 20 mg by mouth daily.    [name] spironolactone (ALDACTONE) 25 MG tablet TAKE ONE-HALF TABLET BY MOUTH ONCE A DAY 30 tablet 10  . valsartan (DIOVAN) 160 MG tablet TAKE 1 TABLET BY MOUTH EVERY DAY 30 tablet 9   No current facility-administered medications for this visit.    Allergies:   Codeine   Social History: History   Social History  . Marital Status: Married    Spouse Name: N/A  . Number of Children: N/A  . Years of Education: N/A   Occupational History  . Not on file.   Social History Main Topics  . Smoking status: Never Smoker   . Smokeless tobacco: Not on file  . Alcohol Use: Not on file  . Drug Use: Not on file  . Sexual Activity: Not on file   Other Topics Concern  . Not on file   Social History Narrative    Family History: Family History  Problem Relation Age of Onset  . Asthma Sister   . Asthma Brother   . Heart attack Father   . Heart attack Sister   . Cancer Mother   . Cancer Sister     Review of Systems: All other systems reviewed and are otherwise negative except as noted above.   Physical Exam: VS:  BP 132/76 mmHg  Pulse 86  Ht 5'  11" (1.803 m)  Wt 262 lb (118.842 kg)  BMI 36.56 kg/m2  SpO2 95% , BMI Body mass index is 36.56 kg/(m^2).  GEN- The patient is obese appearing, alert and oriented x 3 today.   HEENT: normocephalic, atraumatic; sclera clear, conjunctiva pink; hearing intact; oropharynx clear; neck supple, no JVP Lymph- no cervical lymphadenopathy Lungs- Clear to ausculation bilaterally, normal work of breathing.  No wheezes, rales, rhonchi Heart- Regular rate and rhythm, no murmurs, rubs or gallops GI- soft, non-tender, non-distended, bowel sounds present Extremities- no clubbing, cyanosis, or edema; DP/PT/radial pulses 2+ bilaterally MS- no significant deformity or atrophy Skin- warm and dry, no rash or lesion; ICD pocket well healed Psych- euthymic mood, full  affect Neuro- strength and sensation are intact  ICD interrogation- reviewed in detail today,  See PACEART report  EKG:  EKG is ordered today. The ekg ordered today shows sinus rhythm with ventricular pacing  Recent Labs: 12/05/2013: BUN 22; Creatinine 1.5; Hemoglobin 15.2; Platelets 167.0; Potassium 4.2; Sodium 138   Wt Readings from Last 3 Encounters:  08/28/14 262 lb (118.842 kg)  12/11/13 255 lb (115.667 kg)  11/29/13 264 lb 3.2 oz (119.84 kg)     Other studies Reviewed: Additional studies/ records that were reviewed today include: Dr Olin Pia office note, cardiac catheterization report  Assessment and Plan:  1.  Chronic systolic dysfunction euvolemic today Stable on an appropriate medical regimen ICD at [facility] - pt with persistently depressed EF by cath last fall despite guideline directed therapy.  Risks, benefits to generator change were discussed with the patient who wishes to proceed. Add ASA 81mg  daily  See [name] report No changes today  2.  Diabetes Management per PCP Continue ARB  Current medicines are reviewed at length with the patient today.   The patient does not have concerns regarding his medicines.  The following changes were made today:  Add ASA 81mg  daily  Labs/ tests ordered today include: CBC,BMET   Disposition:   Follow up with Dr [name] after generator change    Signed, Chanetta Marshall, NP 08/28/2014 2:27 PM  [facility] [address] [place] [address] [phone] (office) [phone] (fax)

## 2014-09-17 MED FILL — Cefazolin Sodium for IV Soln 2 GM and Dextrose 3% (50 ML): INTRAVENOUS | Qty: 50 | Status: AC

## 2014-09-26 ENCOUNTER — Ambulatory Visit (INDEPENDENT_AMBULATORY_CARE_PROVIDER_SITE_OTHER): Payer: Medicare Other | Admitting: *Deleted

## 2014-09-26 ENCOUNTER — Telehealth: Payer: Self-pay | Admitting: Internal Medicine

## 2014-09-26 ENCOUNTER — Encounter: Payer: Self-pay | Admitting: Internal Medicine

## 2014-09-26 DIAGNOSIS — I429 Cardiomyopathy, unspecified: Secondary | ICD-10-CM | POA: Diagnosis not present

## 2014-09-26 DIAGNOSIS — I5042 Chronic combined systolic (congestive) and diastolic (congestive) heart failure: Secondary | ICD-10-CM

## 2014-09-26 DIAGNOSIS — I428 Other cardiomyopathies: Secondary | ICD-10-CM

## 2014-09-26 DIAGNOSIS — Z95810 Presence of automatic (implantable) cardiac defibrillator: Secondary | ICD-10-CM

## 2014-09-26 LAB — CUP PACEART INCLINIC DEVICE CHECK
Battery Remaining Longevity: 109 mo
Battery Voltage: 3.07 V
Brady Statistic AS VS Percent: 1.29 %
Brady Statistic RA Percent Paced: 44.23 %
Brady Statistic RV Percent Paced: 5.83 %
HIGH POWER IMPEDANCE MEASURED VALUE: 247 Ohm
HighPow Impedance: 53 Ohm
HighPow Impedance: 73 Ohm
Lead Channel Impedance Value: 4047 Ohm
Lead Channel Impedance Value: 532 Ohm
Lead Channel Impedance Value: 722 Ohm
Lead Channel Pacing Threshold Amplitude: 0.625 V
Lead Channel Pacing Threshold Amplitude: 1.25 V
Lead Channel Pacing Threshold Pulse Width: 0.4 ms
Lead Channel Pacing Threshold Pulse Width: 0.4 ms
Lead Channel Sensing Intrinsic Amplitude: 4.125 mV
Lead Channel Setting Pacing Amplitude: 2.5 V
Lead Channel Setting Pacing Pulse Width: 0.6 ms
MDC IDC MSMT LEADCHNL LV IMPEDANCE VALUE: 4047 Ohm
MDC IDC MSMT LEADCHNL LV PACING THRESHOLD AMPLITUDE: 0.875 V
MDC IDC MSMT LEADCHNL LV PACING THRESHOLD PULSEWIDTH: 0.6 ms
MDC IDC MSMT LEADCHNL RA SENSING INTR AMPL: 3.75 mV
MDC IDC MSMT LEADCHNL RV IMPEDANCE VALUE: 456 Ohm
MDC IDC MSMT LEADCHNL RV SENSING INTR AMPL: 25.375 mV
MDC IDC MSMT LEADCHNL RV SENSING INTR AMPL: 30.5 mV
MDC IDC SESS DTM: 20160623105820
MDC IDC SET LEADCHNL LV PACING AMPLITUDE: 1.75 V
MDC IDC SET LEADCHNL RA PACING AMPLITUDE: 1.5 V
MDC IDC SET LEADCHNL RV PACING PULSEWIDTH: 0.4 ms
MDC IDC SET LEADCHNL RV SENSING SENSITIVITY: 0.6 mV
MDC IDC SET ZONE DETECTION INTERVAL: 300 ms
MDC IDC SET ZONE DETECTION INTERVAL: 350 ms
MDC IDC SET ZONE DETECTION INTERVAL: 360 ms
MDC IDC SET ZONE DETECTION INTERVAL: 450 ms
MDC IDC STAT BRADY AP VP PERCENT: 43.55 %
MDC IDC STAT BRADY AP VS PERCENT: 0.69 %
MDC IDC STAT BRADY AS VP PERCENT: 54.48 %

## 2014-09-26 NOTE — Telephone Encounter (Signed)
Informed pt that transmission was received. Pt verbalized understanding.  

## 2014-09-26 NOTE — Progress Notes (Signed)
CRT-D changeout Wound check appointment. No steri-strips, dermabond used. Wound without redness or edema. Incision edges approximated, wound well healed. Normal device function. Thresholds, sensing, and impedances consistent with implant measurements. Device programmed at chronic output settings. Histogram distribution appropriate for patient and level of activity. BiVpacing 97.6% No mode switches or ventricular arrhythmias noted. Patient educated about wound care, arm mobility, lifting restrictions, shock plan. [facility] 12/26/14 & ROV w/ SK in 42mo.

## 2014-09-26 NOTE — Telephone Encounter (Signed)
New problem   Pt want to know if Juanda Crumble received his transmission that he told him to send today

## 2014-09-30 ENCOUNTER — Encounter: Payer: Self-pay | Admitting: Internal Medicine

## 2014-10-09 ENCOUNTER — Encounter: Payer: Self-pay | Admitting: *Deleted

## 2014-10-23 ENCOUNTER — Encounter: Payer: Self-pay | Admitting: Internal Medicine

## 2014-10-24 ENCOUNTER — Encounter: Payer: Self-pay | Admitting: Internal Medicine

## 2014-10-31 ENCOUNTER — Other Ambulatory Visit: Payer: Self-pay | Admitting: Internal Medicine

## 2014-12-26 ENCOUNTER — Ambulatory Visit (INDEPENDENT_AMBULATORY_CARE_PROVIDER_SITE_OTHER): Payer: Medicare Other | Admitting: *Deleted

## 2014-12-26 DIAGNOSIS — I429 Cardiomyopathy, unspecified: Secondary | ICD-10-CM

## 2014-12-26 DIAGNOSIS — I428 Other cardiomyopathies: Secondary | ICD-10-CM

## 2014-12-26 DIAGNOSIS — I5042 Chronic combined systolic (congestive) and diastolic (congestive) heart failure: Secondary | ICD-10-CM | POA: Diagnosis not present

## 2014-12-26 NOTE — Progress Notes (Signed)
Remote ICD transmission.   

## 2014-12-30 LAB — CUP PACEART REMOTE DEVICE CHECK
Battery Remaining Longevity: 99 mo
Battery Voltage: 3.05 V
Brady Statistic AP VP Percent: 42.07 %
Brady Statistic AP VS Percent: 0.66 %
Brady Statistic RV Percent Paced: 7.51 %
Date Time Interrogation Session: 20160922085214
HIGH POWER IMPEDANCE MEASURED VALUE: 50 Ohm
HighPow Impedance: 60 Ohm
Lead Channel Impedance Value: 4047 Ohm
Lead Channel Impedance Value: 513 Ohm
Lead Channel Impedance Value: 608 Ohm
Lead Channel Pacing Threshold Amplitude: 0.5 V
Lead Channel Pacing Threshold Amplitude: 1.375 V
Lead Channel Pacing Threshold Pulse Width: 0.4 ms
Lead Channel Sensing Intrinsic Amplitude: 2.75 mV
Lead Channel Sensing Intrinsic Amplitude: 2.75 mV
Lead Channel Setting Pacing Amplitude: 1.5 V
Lead Channel Setting Pacing Amplitude: 2.75 V
Lead Channel Setting Pacing Pulse Width: 0.6 ms
Lead Channel Setting Sensing Sensitivity: 0.6 mV
MDC IDC MSMT LEADCHNL LV IMPEDANCE VALUE: 4047 Ohm
MDC IDC MSMT LEADCHNL LV PACING THRESHOLD AMPLITUDE: 1.625 V
MDC IDC MSMT LEADCHNL LV PACING THRESHOLD PULSEWIDTH: 0.6 ms
MDC IDC MSMT LEADCHNL RA IMPEDANCE VALUE: 475 Ohm
MDC IDC MSMT LEADCHNL RV IMPEDANCE VALUE: 361 Ohm
MDC IDC MSMT LEADCHNL RV PACING THRESHOLD PULSEWIDTH: 0.4 ms
MDC IDC MSMT LEADCHNL RV SENSING INTR AMPL: 18.875 mV
MDC IDC MSMT LEADCHNL RV SENSING INTR AMPL: 18.875 mV
MDC IDC SET LEADCHNL LV PACING AMPLITUDE: 2.25 V
MDC IDC SET LEADCHNL RV PACING PULSEWIDTH: 0.4 ms
MDC IDC SET ZONE DETECTION INTERVAL: 360 ms
MDC IDC STAT BRADY AS VP PERCENT: 55.96 %
MDC IDC STAT BRADY AS VS PERCENT: 1.32 %
MDC IDC STAT BRADY RA PERCENT PACED: 42.73 %
Zone Setting Detection Interval: 300 ms
Zone Setting Detection Interval: 350 ms
Zone Setting Detection Interval: 450 ms

## 2015-01-14 ENCOUNTER — Encounter: Payer: Self-pay | Admitting: Cardiology

## 2015-01-23 ENCOUNTER — Encounter: Payer: Self-pay | Admitting: Internal Medicine

## 2015-02-03 DIAGNOSIS — Z23 Encounter for immunization: Secondary | ICD-10-CM | POA: Diagnosis not present

## 2015-02-05 ENCOUNTER — Telehealth: Payer: Self-pay | Admitting: Internal Medicine

## 2015-02-05 MED ORDER — CARVEDILOL 25 MG PO TABS: 25.0000 mg | ORAL_TABLET | Freq: Two times a day (BID) | ORAL | Status: DC

## 2015-02-05 MED ORDER — VALSARTAN 160 MG PO TABS: 160.0000 mg | ORAL_TABLET | Freq: Every day | ORAL | Status: DC

## 2015-02-05 NOTE — Telephone Encounter (Signed)
New message     *STAT* If patient is at the pharmacy, call can be transferred to refill team.   1. Which medications need to be refilled? (please list name of each medication and dose if known)  Valsartan  160 mg  - 30 days  , carvedilol 25 mg -90 days   2. Which pharmacy/location (including street and city if local pharmacy) is medication to be sent to? [facility] in [place] [phone]   3. Do they need a 30 day or 90 day supply? valsartin  30 days patient is asking for  90 days .     Pharmacy fax over on [phone]

## 2015-02-26 DIAGNOSIS — E782 Mixed hyperlipidemia: Secondary | ICD-10-CM | POA: Diagnosis not present

## 2015-02-26 DIAGNOSIS — Z7984 Long term (current) use of oral hypoglycemic drugs: Secondary | ICD-10-CM | POA: Diagnosis not present

## 2015-02-26 DIAGNOSIS — N183 Chronic kidney disease, stage 3 (moderate): Secondary | ICD-10-CM | POA: Diagnosis not present

## 2015-02-26 DIAGNOSIS — Z0000 Encounter for general adult medical examination without abnormal findings: Secondary | ICD-10-CM | POA: Diagnosis not present

## 2015-02-26 DIAGNOSIS — I130 Hypertensive heart and chronic kidney disease with heart failure and stage 1 through stage 4 chronic kidney disease, or unspecified chronic kidney disease: Secondary | ICD-10-CM | POA: Diagnosis not present

## 2015-02-26 DIAGNOSIS — Z6834 Body mass index (BMI) 34.0-34.9, adult: Secondary | ICD-10-CM | POA: Diagnosis not present

## 2015-02-26 DIAGNOSIS — E1122 Type 2 diabetes mellitus with diabetic chronic kidney disease: Secondary | ICD-10-CM | POA: Diagnosis not present

## 2015-02-26 DIAGNOSIS — Z125 Encounter for screening for malignant neoplasm of prostate: Secondary | ICD-10-CM | POA: Diagnosis not present

## 2015-02-26 DIAGNOSIS — I5022 Chronic systolic (congestive) heart failure: Secondary | ICD-10-CM | POA: Diagnosis not present

## 2015-02-26 DIAGNOSIS — I429 Cardiomyopathy, unspecified: Secondary | ICD-10-CM | POA: Diagnosis not present

## 2015-03-05 DIAGNOSIS — N179 Acute kidney failure, unspecified: Secondary | ICD-10-CM | POA: Diagnosis not present

## 2015-03-05 DIAGNOSIS — E1122 Type 2 diabetes mellitus with diabetic chronic kidney disease: Secondary | ICD-10-CM | POA: Diagnosis not present

## 2015-03-12 ENCOUNTER — Other Ambulatory Visit: Payer: Self-pay | Admitting: *Deleted

## 2015-03-12 ENCOUNTER — Telehealth: Payer: Self-pay | Admitting: Internal Medicine

## 2015-03-12 MED ORDER — SPIRONOLACTONE 25 MG PO TABS: 25.0000 mg | ORAL_TABLET | Freq: Once | ORAL | Status: DC

## 2015-03-12 NOTE — Telephone Encounter (Signed)
New Message  Pt stated that his pharmacy has sent 5 requests for refill. Pt requested a call back. Please call back and discuss.     *STAT* If patient is at the pharmacy, call can be transferred to refill team.   1. Which medications need to be refilled? (please list name of each medication and dose if known) spirolactone 25 mg  2. Which pharmacy/location (including street and city if local pharmacy) is medication to be sent to? [facility] [place]  3. Do they need a 30 day or 90 day supply?

## 2015-04-01 ENCOUNTER — Ambulatory Visit (INDEPENDENT_AMBULATORY_CARE_PROVIDER_SITE_OTHER): Payer: Medicare Other | Admitting: *Deleted

## 2015-04-01 DIAGNOSIS — I429 Cardiomyopathy, unspecified: Secondary | ICD-10-CM

## 2015-04-01 DIAGNOSIS — I428 Other cardiomyopathies: Secondary | ICD-10-CM

## 2015-04-01 DIAGNOSIS — I5042 Chronic combined systolic (congestive) and diastolic (congestive) heart failure: Secondary | ICD-10-CM | POA: Diagnosis not present

## 2015-04-02 NOTE — Progress Notes (Signed)
Remote ICD transmission.   

## 2015-04-03 ENCOUNTER — Encounter: Payer: Self-pay | Admitting: Cardiology

## 2015-04-03 LAB — CUP PACEART REMOTE DEVICE CHECK
Brady Statistic AP VS Percent: 0.62 %
Brady Statistic AS VP Percent: 59.09 %
Brady Statistic RA Percent Paced: 39.99 %
Brady Statistic RV Percent Paced: 3.74 %
HighPow Impedance: 52 Ohm
HighPow Impedance: 63 Ohm
Implantable Lead Implant Date: 20060323
Implantable Lead Location: 753859
Implantable Lead Location: 753860
Implantable Lead Model: 5076
Lead Channel Impedance Value: 4047 Ohm
Lead Channel Impedance Value: 513 Ohm
Lead Channel Impedance Value: 665 Ohm
Lead Channel Setting Pacing Amplitude: 1.5 V
Lead Channel Setting Pacing Amplitude: 1.5 V
Lead Channel Setting Pacing Pulse Width: 0.6 ms
MDC IDC LEAD IMPLANT DT: 20060323
MDC IDC LEAD IMPLANT DT: 20110420
MDC IDC LEAD LOCATION: 753858
MDC IDC LEAD MODEL: 7121
MDC IDC MSMT BATTERY REMAINING LONGEVITY: 105 mo
MDC IDC MSMT BATTERY VOLTAGE: 3.03 V
MDC IDC MSMT LEADCHNL LV IMPEDANCE VALUE: 4047 Ohm
MDC IDC MSMT LEADCHNL LV PACING THRESHOLD AMPLITUDE: 1 V
MDC IDC MSMT LEADCHNL LV PACING THRESHOLD PULSEWIDTH: 0.6 ms
MDC IDC MSMT LEADCHNL RA PACING THRESHOLD AMPLITUDE: 0.625 V
MDC IDC MSMT LEADCHNL RA PACING THRESHOLD PULSEWIDTH: 0.4 ms
MDC IDC MSMT LEADCHNL RA SENSING INTR AMPL: 2.75 mV
MDC IDC MSMT LEADCHNL RA SENSING INTR AMPL: 2.75 mV
MDC IDC MSMT LEADCHNL RV IMPEDANCE VALUE: 304 Ohm
MDC IDC MSMT LEADCHNL RV IMPEDANCE VALUE: 418 Ohm
MDC IDC MSMT LEADCHNL RV PACING THRESHOLD AMPLITUDE: 1.375 V
MDC IDC MSMT LEADCHNL RV PACING THRESHOLD PULSEWIDTH: 0.4 ms
MDC IDC MSMT LEADCHNL RV SENSING INTR AMPL: 19.125 mV
MDC IDC MSMT LEADCHNL RV SENSING INTR AMPL: 19.125 mV
MDC IDC SESS DTM: 20161227111501
MDC IDC SET LEADCHNL RV SENSING SENSITIVITY: 0.6 mV
MDC IDC STAT BRADY AP VP PERCENT: 39.37 %
MDC IDC STAT BRADY AS VS PERCENT: 0.92 %

## 2015-04-29 DIAGNOSIS — C44319 Basal cell carcinoma of skin of other parts of face: Secondary | ICD-10-CM | POA: Diagnosis not present

## 2015-04-29 DIAGNOSIS — D485 Neoplasm of uncertain behavior of skin: Secondary | ICD-10-CM | POA: Diagnosis not present

## 2015-04-29 DIAGNOSIS — Z85828 Personal history of other malignant neoplasm of skin: Secondary | ICD-10-CM | POA: Diagnosis not present

## 2015-04-29 DIAGNOSIS — L578 Other skin changes due to chronic exposure to nonionizing radiation: Secondary | ICD-10-CM | POA: Diagnosis not present

## 2015-04-29 DIAGNOSIS — L570 Actinic keratosis: Secondary | ICD-10-CM | POA: Diagnosis not present

## 2015-04-29 DIAGNOSIS — L821 Other seborrheic keratosis: Secondary | ICD-10-CM | POA: Diagnosis not present

## 2015-05-12 DIAGNOSIS — C44319 Basal cell carcinoma of skin of other parts of face: Secondary | ICD-10-CM | POA: Diagnosis not present

## 2015-05-12 DIAGNOSIS — Z85828 Personal history of other malignant neoplasm of skin: Secondary | ICD-10-CM | POA: Diagnosis not present

## 2015-05-29 DIAGNOSIS — E1122 Type 2 diabetes mellitus with diabetic chronic kidney disease: Secondary | ICD-10-CM | POA: Diagnosis not present

## 2015-05-29 DIAGNOSIS — H6122 Impacted cerumen, left ear: Secondary | ICD-10-CM | POA: Diagnosis not present

## 2015-05-29 DIAGNOSIS — H6120 Impacted cerumen, unspecified ear: Secondary | ICD-10-CM | POA: Insufficient documentation

## 2015-05-29 DIAGNOSIS — H9311 Tinnitus, right ear: Secondary | ICD-10-CM | POA: Diagnosis not present

## 2015-05-29 DIAGNOSIS — H905 Unspecified sensorineural hearing loss: Secondary | ICD-10-CM | POA: Insufficient documentation

## 2015-05-29 DIAGNOSIS — Z7984 Long term (current) use of oral hypoglycemic drugs: Secondary | ICD-10-CM | POA: Diagnosis not present

## 2015-05-29 DIAGNOSIS — H9041 Sensorineural hearing loss, unilateral, right ear, with unrestricted hearing on the contralateral side: Secondary | ICD-10-CM | POA: Diagnosis not present

## 2015-05-29 DIAGNOSIS — H903 Sensorineural hearing loss, bilateral: Secondary | ICD-10-CM | POA: Insufficient documentation

## 2015-05-29 DIAGNOSIS — H9319 Tinnitus, unspecified ear: Secondary | ICD-10-CM | POA: Insufficient documentation

## 2015-07-01 ENCOUNTER — Ambulatory Visit (INDEPENDENT_AMBULATORY_CARE_PROVIDER_SITE_OTHER): Payer: Medicare Other | Admitting: *Deleted

## 2015-07-01 DIAGNOSIS — I429 Cardiomyopathy, unspecified: Secondary | ICD-10-CM

## 2015-07-01 DIAGNOSIS — Z95810 Presence of automatic (implantable) cardiac defibrillator: Secondary | ICD-10-CM

## 2015-07-01 DIAGNOSIS — I428 Other cardiomyopathies: Secondary | ICD-10-CM

## 2015-07-01 DIAGNOSIS — I5042 Chronic combined systolic (congestive) and diastolic (congestive) heart failure: Secondary | ICD-10-CM | POA: Diagnosis not present

## 2015-07-01 NOTE — Progress Notes (Signed)
Remote ICD transmission.   

## 2015-07-11 LAB — CUP PACEART REMOTE DEVICE CHECK
Brady Statistic AP VP Percent: 48.67 %
Brady Statistic AP VS Percent: 0.75 %
Brady Statistic AS VP Percent: 49.37 %
Brady Statistic AS VS Percent: 1.2 %
Brady Statistic RA Percent Paced: 49.42 %
HIGH POWER IMPEDANCE MEASURED VALUE: 48 Ohm
HIGH POWER IMPEDANCE MEASURED VALUE: 59 Ohm
Implantable Lead Implant Date: 20060323
Implantable Lead Implant Date: 20060323
Implantable Lead Implant Date: 20110420
Implantable Lead Location: 753859
Implantable Lead Location: 753860
Implantable Lead Model: 4193
Implantable Lead Model: 5076
Lead Channel Impedance Value: 285 Ohm
Lead Channel Impedance Value: 4047 Ohm
Lead Channel Impedance Value: 4047 Ohm
Lead Channel Impedance Value: 418 Ohm
Lead Channel Impedance Value: 589 Ohm
Lead Channel Pacing Threshold Amplitude: 0.5 V
Lead Channel Pacing Threshold Pulse Width: 0.4 ms
Lead Channel Pacing Threshold Pulse Width: 0.6 ms
Lead Channel Sensing Intrinsic Amplitude: 25.75 mV
Lead Channel Sensing Intrinsic Amplitude: 25.75 mV
Lead Channel Setting Pacing Amplitude: 1.5 V
Lead Channel Setting Pacing Amplitude: 2 V
Lead Channel Setting Pacing Pulse Width: 0.6 ms
Lead Channel Setting Sensing Sensitivity: 0.6 mV
MDC IDC LEAD LOCATION: 753858
MDC IDC LEAD MODEL: 7121
MDC IDC MSMT BATTERY REMAINING LONGEVITY: 95 mo
MDC IDC MSMT BATTERY VOLTAGE: 3.02 V
MDC IDC MSMT LEADCHNL LV PACING THRESHOLD AMPLITUDE: 1.125 V
MDC IDC MSMT LEADCHNL RA IMPEDANCE VALUE: 513 Ohm
MDC IDC MSMT LEADCHNL RA SENSING INTR AMPL: 4.75 mV
MDC IDC MSMT LEADCHNL RA SENSING INTR AMPL: 4.75 mV
MDC IDC MSMT LEADCHNL RV PACING THRESHOLD AMPLITUDE: 1.375 V
MDC IDC MSMT LEADCHNL RV PACING THRESHOLD PULSEWIDTH: 0.4 ms
MDC IDC SESS DTM: 20170328073522
MDC IDC STAT BRADY RV PERCENT PACED: 7.46 %

## 2015-07-15 ENCOUNTER — Encounter: Payer: Self-pay | Admitting: Cardiology

## 2015-08-01 ENCOUNTER — Other Ambulatory Visit: Payer: Self-pay | Admitting: Internal Medicine

## 2015-08-04 ENCOUNTER — Encounter: Payer: Self-pay | Admitting: Internal Medicine

## 2015-08-04 ENCOUNTER — Ambulatory Visit (INDEPENDENT_AMBULATORY_CARE_PROVIDER_SITE_OTHER): Payer: Medicare Other | Admitting: Internal Medicine

## 2015-08-04 VITALS — BP 120/60 | HR 77 | Ht 71.0 in | Wt 254.0 lb

## 2015-08-04 DIAGNOSIS — I429 Cardiomyopathy, unspecified: Secondary | ICD-10-CM | POA: Diagnosis not present

## 2015-08-04 DIAGNOSIS — I428 Other cardiomyopathies: Secondary | ICD-10-CM

## 2015-08-04 LAB — CUP PACEART INCLINIC DEVICE CHECK
Battery Voltage: 3.01 V
Brady Statistic AS VP Percent: 54.65 %
Brady Statistic AS VS Percent: 1.12 %
HIGH POWER IMPEDANCE MEASURED VALUE: 57 Ohm
HIGH POWER IMPEDANCE MEASURED VALUE: 75 Ohm
Implantable Lead Implant Date: 20060323
Implantable Lead Location: 753858
Implantable Lead Location: 753859
Implantable Lead Location: 753860
Implantable Lead Model: 4193
Implantable Lead Model: 7121
Lead Channel Impedance Value: 304 Ohm
Lead Channel Impedance Value: 4047 Ohm
Lead Channel Impedance Value: 456 Ohm
Lead Channel Pacing Threshold Amplitude: 0.5 V
Lead Channel Pacing Threshold Amplitude: 0.75 V
Lead Channel Pacing Threshold Amplitude: 1.25 V
Lead Channel Pacing Threshold Pulse Width: 0.4 ms
Lead Channel Pacing Threshold Pulse Width: 0.4 ms
Lead Channel Sensing Intrinsic Amplitude: 22.625 mV
Lead Channel Setting Pacing Amplitude: 1.5 V
Lead Channel Setting Pacing Amplitude: 1.5 V
Lead Channel Setting Pacing Pulse Width: 0.6 ms
Lead Channel Setting Sensing Sensitivity: 0.6 mV
MDC IDC LEAD IMPLANT DT: 20060323
MDC IDC LEAD IMPLANT DT: 20110420
MDC IDC MSMT BATTERY REMAINING LONGEVITY: 99 mo
MDC IDC MSMT LEADCHNL LV IMPEDANCE VALUE: 4047 Ohm
MDC IDC MSMT LEADCHNL LV IMPEDANCE VALUE: 608 Ohm
MDC IDC MSMT LEADCHNL RA IMPEDANCE VALUE: 513 Ohm
MDC IDC MSMT LEADCHNL RA PACING THRESHOLD PULSEWIDTH: 0.4 ms
MDC IDC MSMT LEADCHNL RA SENSING INTR AMPL: 4.125 mV
MDC IDC SESS DTM: 20170501130914
MDC IDC STAT BRADY AP VP PERCENT: 43.56 %
MDC IDC STAT BRADY AP VS PERCENT: 0.68 %
MDC IDC STAT BRADY RA PERCENT PACED: 44.24 %
MDC IDC STAT BRADY RV PERCENT PACED: 6.27 %

## 2015-08-04 MED ORDER — VALSARTAN 80 MG PO TABS: 80.0000 mg | ORAL_TABLET | Freq: Every day | ORAL | Status: DC

## 2015-08-04 NOTE — Patient Instructions (Signed)
Medication Instructions:   DECREASE VALSARTAN TO 80 MG ONCE DAILY= 1/2 OF THE 160 MG TABLET ONCE DAILY  Follow-Up:  Remote monitoring is used to monitor your Pacemaker of ICD from home. This monitoring reduces the number of office visits required to check your device to one time per year. It allows [place] to keep an eye on the functioning of your device to ensure it is working properly. You are scheduled for a device check from home on 11-03-15. You may send your transmission at any time that day. If you have a wireless device, the transmission will be sent automatically. After your physician reviews your transmission, you will receive a postcard with your next transmission date.  Your physician wants you to follow-up in: [place] NP You will receive a reminder letter in the mail two months in advance. If you don't receive a letter, please call our office to schedule the follow-up appointment.     If you need a refill on your cardiac medications before your next appointment, please call your pharmacy.

## 2015-08-04 NOTE — Progress Notes (Signed)
Patient Care Team: [name], MD as PCP - General (Family Medicine)   HPI  Russell Fuentes is a 65 y.o. male Seen in followup for an ICD to CRT implanted for nonischemic cardiomyopathy;  he underwent generator replacement 6/16  Ejection fraction by echo 11/14 was normal  He underwent catheterization 2015 because of ongoing problems with chest discomfort. This demonstrated no obstructive coronary disease   The patient denies chest pain, shortness of breath, nocturnal dyspnea, orthopnea or peripheral edema.  There have been no palpitations.  He has had some spells of orthostatic hypotension with doc BP 77      Past Medical History  Diagnosis Date  . Chronic combined systolic and diastolic heart failure ([place])    . Nonischemic cardiomyopathy ([place])      a. s/p MDT CRTD  . Orthostatic lightheadedness    . Diabetes mellitus with neuropathy [facility])      Past Surgical History  Procedure Laterality Date  . Coronary artery bypass graft    . Left heart catheterization with coronary angiogram N/A 12/11/2013    Procedure: LEFT HEART CATHETERIZATION WITH CORONARY ANGIOGRAM;  Surgeon: [name], MD;  Location: [facility] CATH LAB;  Service: Cardiovascular;  Laterality: N/A;  . Ep implantable device N/A 09/16/2014    Procedure: ICD Generator Changeout;  Surgeon: [name], MD;  Location: [facility] CV LAB;  Service: Cardiovascular;  Laterality: N/A;  . Doppler echocardiography  04/28/2011    LVEF 41% by simpson's borderline concentric LVH, global hypokinesis with regional variation. Stage 1 diastolic dysfunction, normal LV filling pressure. Normal LA size. Right heart pacing wires noted. Trace MR, TR. Normal RVSP  . Nm myocar perf ejection fraction  04/03/2009, 11/18/2005    The post stress myocardial perfusion images show a normal pattern of perfusion in all regions. The post-stress ejection fraction is 50%. No significant wall motion abnormalities noted. This is a low risk scan.   . Cardiac catheterization  12/31/1998    Bi-plane cine left ventriculography revealed low-normal left ventricular function with an ejection fraction in the 45% to 50% range.    Current Outpatient Prescriptions  Medication Sig Dispense Refill  . aspirin 81 MG tablet Take 81 mg by mouth daily.    . carvedilol (COREG) 25 MG tablet TAKE 1 TABLET(25 MG) BY MOUTH TWICE DAILY 180 tablet 0  . glimepiride (AMARYL) 4 MG tablet Take 4 mg by mouth daily.     [name] losartan (COZAAR) 100 MG tablet Take 100 mg by mouth daily.    [name] spironolactone (ALDACTONE) 25 MG tablet Take 12.5 mg by mouth daily.  2  . valsartan (DIOVAN) 80 MG tablet Take 1 tablet (80 mg total) by mouth daily. 90 tablet 3   No current facility-administered medications for this visit.    Allergies  Allergen Reactions  . Codeine Rash    Review of Systems negative except from HPI and PMH  Physical Exam BP 120/60 mmHg  Pulse 77  Ht 5\' 11"  (1.803 m)  Wt 254 lb (115.214 kg)  BMI 35.44 kg/m2 Well developed and well nourished in no acute distress HENT normal E scleral and icterus clear Neck Supple JVP flat; carotids brisk and full Clear to ausculation Device pocket well healed; without hematoma or erythema.  There is no tethering   Regular rate and rhythm, no murmurs gallops or rub Soft with active bowel sounds No clubbing cyanosis  Edema Alert and oriented, grossly normal motor and sensory function Skin  Warm and Dry  ECG demonstrates P. synchronous biventricular pacing   Assessment and  Plan  Nonischemic cardiomyopathy-intercurrently resolved  Chest pain concerning for ischemia  Borderline abnormal stress test  Diabetes  Implantable defibrillator-[employer]  Sleep disorder breathing  The patient has concerning chest discomfort. This is progressive over the last year. With borderline negative abnormal stress test a year ago we'll undertake catheterization.  Right now we'll continue him on his current  medications.  It is his impression that while he has sleep disordered breathing he does not have daytime somnolence ;  Is not interested in pursuing a sleep study.  He is working on his diabetes drop his A1c by weight loss. He is to be congratulated

## 2015-08-29 DIAGNOSIS — Z6837 Body mass index (BMI) 37.0-37.9, adult: Secondary | ICD-10-CM | POA: Diagnosis not present

## 2015-08-29 DIAGNOSIS — I130 Hypertensive heart and chronic kidney disease with heart failure and stage 1 through stage 4 chronic kidney disease, or unspecified chronic kidney disease: Secondary | ICD-10-CM | POA: Diagnosis not present

## 2015-08-29 DIAGNOSIS — N183 Chronic kidney disease, stage 3 (moderate): Secondary | ICD-10-CM | POA: Diagnosis not present

## 2015-08-29 DIAGNOSIS — I5022 Chronic systolic (congestive) heart failure: Secondary | ICD-10-CM | POA: Diagnosis not present

## 2015-08-29 DIAGNOSIS — E782 Mixed hyperlipidemia: Secondary | ICD-10-CM | POA: Diagnosis not present

## 2015-08-29 DIAGNOSIS — E1122 Type 2 diabetes mellitus with diabetic chronic kidney disease: Secondary | ICD-10-CM | POA: Diagnosis not present

## 2015-09-30 ENCOUNTER — Encounter: Payer: Medicare Other | Admitting: Internal Medicine

## 2015-10-30 ENCOUNTER — Other Ambulatory Visit: Payer: Self-pay | Admitting: Internal Medicine

## 2015-11-03 ENCOUNTER — Ambulatory Visit (INDEPENDENT_AMBULATORY_CARE_PROVIDER_SITE_OTHER): Payer: Medicare Other | Admitting: *Deleted

## 2015-11-03 DIAGNOSIS — I429 Cardiomyopathy, unspecified: Secondary | ICD-10-CM

## 2015-11-03 DIAGNOSIS — I5042 Chronic combined systolic (congestive) and diastolic (congestive) heart failure: Secondary | ICD-10-CM

## 2015-11-03 DIAGNOSIS — I428 Other cardiomyopathies: Secondary | ICD-10-CM

## 2015-11-03 NOTE — Progress Notes (Signed)
Remote ICD transmission.   

## 2015-11-06 LAB — CUP PACEART REMOTE DEVICE CHECK
Brady Statistic AP VP Percent: 44.19 %
Brady Statistic AP VS Percent: 0.68 %
Brady Statistic AS VP Percent: 54.1 %
Brady Statistic RA Percent Paced: 44.87 %
HIGH POWER IMPEDANCE MEASURED VALUE: 64 Ohm
HighPow Impedance: 50 Ohm
Implantable Lead Implant Date: 20060323
Implantable Lead Implant Date: 20060323
Implantable Lead Implant Date: 20110420
Implantable Lead Location: 753859
Implantable Lead Location: 753860
Implantable Lead Model: 4193
Implantable Lead Model: 5076
Lead Channel Impedance Value: 285 Ohm
Lead Channel Impedance Value: 4047 Ohm
Lead Channel Impedance Value: 4047 Ohm
Lead Channel Impedance Value: 418 Ohm
Lead Channel Impedance Value: 608 Ohm
Lead Channel Pacing Threshold Amplitude: 0.625 V
Lead Channel Pacing Threshold Amplitude: 0.875 V
Lead Channel Pacing Threshold Pulse Width: 0.4 ms
Lead Channel Pacing Threshold Pulse Width: 0.6 ms
Lead Channel Sensing Intrinsic Amplitude: 19.5 mV
Lead Channel Sensing Intrinsic Amplitude: 19.5 mV
Lead Channel Setting Pacing Amplitude: 1.5 V
Lead Channel Setting Pacing Amplitude: 1.5 V
Lead Channel Setting Pacing Pulse Width: 0.6 ms
Lead Channel Setting Sensing Sensitivity: 0.6 mV
MDC IDC LEAD LOCATION: 753858
MDC IDC LEAD MODEL: 7121
MDC IDC MSMT BATTERY REMAINING LONGEVITY: 96 mo
MDC IDC MSMT BATTERY VOLTAGE: 3 V
MDC IDC MSMT LEADCHNL RA IMPEDANCE VALUE: 513 Ohm
MDC IDC MSMT LEADCHNL RA SENSING INTR AMPL: 3.25 mV
MDC IDC MSMT LEADCHNL RA SENSING INTR AMPL: 3.25 mV
MDC IDC MSMT LEADCHNL RV PACING THRESHOLD AMPLITUDE: 1.125 V
MDC IDC MSMT LEADCHNL RV PACING THRESHOLD PULSEWIDTH: 0.4 ms
MDC IDC SESS DTM: 20170731113253
MDC IDC STAT BRADY AS VS PERCENT: 1.03 %
MDC IDC STAT BRADY RV PERCENT PACED: 6.57 %

## 2015-11-11 ENCOUNTER — Encounter: Payer: Self-pay | Admitting: Cardiology

## 2016-02-02 ENCOUNTER — Ambulatory Visit (INDEPENDENT_AMBULATORY_CARE_PROVIDER_SITE_OTHER): Payer: Medicare Other | Admitting: *Deleted

## 2016-02-02 DIAGNOSIS — I428 Other cardiomyopathies: Secondary | ICD-10-CM

## 2016-02-02 DIAGNOSIS — I5042 Chronic combined systolic (congestive) and diastolic (congestive) heart failure: Secondary | ICD-10-CM

## 2016-02-03 NOTE — Progress Notes (Signed)
Remote ICD transmission.   

## 2016-02-04 DIAGNOSIS — Z23 Encounter for immunization: Secondary | ICD-10-CM | POA: Diagnosis not present

## 2016-02-06 ENCOUNTER — Encounter: Payer: Self-pay | Admitting: Cardiology

## 2016-02-18 LAB — CUP PACEART REMOTE DEVICE CHECK
Battery Voltage: 2.99 V
Brady Statistic AP VS Percent: 0.63 %
Brady Statistic RA Percent Paced: 40.98 %
HIGH POWER IMPEDANCE MEASURED VALUE: 59 Ohm
HighPow Impedance: 48 Ohm
Implantable Lead Implant Date: 20060323
Implantable Lead Implant Date: 20060323
Implantable Lead Implant Date: 20110420
Implantable Lead Location: 753860
Implantable Lead Model: 5076
Implantable Pulse Generator Implant Date: 20160613
Lead Channel Impedance Value: 513 Ohm
Lead Channel Pacing Threshold Pulse Width: 0.4 ms
Lead Channel Pacing Threshold Pulse Width: 0.6 ms
Lead Channel Sensing Intrinsic Amplitude: 16.375 mV
Lead Channel Sensing Intrinsic Amplitude: 16.375 mV
Lead Channel Sensing Intrinsic Amplitude: 2.75 mV
Lead Channel Setting Pacing Amplitude: 1.5 V
Lead Channel Setting Pacing Pulse Width: 0.6 ms
MDC IDC LEAD LOCATION: 753858
MDC IDC LEAD LOCATION: 753859
MDC IDC LEAD MODEL: 7121
MDC IDC MSMT BATTERY REMAINING LONGEVITY: 90 mo
MDC IDC MSMT LEADCHNL LV IMPEDANCE VALUE: 4047 Ohm
MDC IDC MSMT LEADCHNL LV IMPEDANCE VALUE: 4047 Ohm
MDC IDC MSMT LEADCHNL LV IMPEDANCE VALUE: 475 Ohm
MDC IDC MSMT LEADCHNL LV PACING THRESHOLD AMPLITUDE: 0.875 V
MDC IDC MSMT LEADCHNL RA PACING THRESHOLD AMPLITUDE: 0.625 V
MDC IDC MSMT LEADCHNL RA PACING THRESHOLD PULSEWIDTH: 0.4 ms
MDC IDC MSMT LEADCHNL RA SENSING INTR AMPL: 2.75 mV
MDC IDC MSMT LEADCHNL RV IMPEDANCE VALUE: 304 Ohm
MDC IDC MSMT LEADCHNL RV IMPEDANCE VALUE: 418 Ohm
MDC IDC MSMT LEADCHNL RV PACING THRESHOLD AMPLITUDE: 1.5 V
MDC IDC SESS DTM: 20171030083725
MDC IDC SET LEADCHNL RA PACING AMPLITUDE: 1.5 V
MDC IDC SET LEADCHNL RV PACING AMPLITUDE: 3 V
MDC IDC SET LEADCHNL RV PACING PULSEWIDTH: 0.4 ms
MDC IDC SET LEADCHNL RV SENSING SENSITIVITY: 0.6 mV
MDC IDC STAT BRADY AP VP PERCENT: 40.35 %
MDC IDC STAT BRADY AS VP PERCENT: 57.87 %
MDC IDC STAT BRADY AS VS PERCENT: 1.15 %
MDC IDC STAT BRADY RV PERCENT PACED: 4.45 %

## 2016-05-03 ENCOUNTER — Ambulatory Visit (INDEPENDENT_AMBULATORY_CARE_PROVIDER_SITE_OTHER): Payer: Medicare Other | Admitting: *Deleted

## 2016-05-03 DIAGNOSIS — I428 Other cardiomyopathies: Secondary | ICD-10-CM

## 2016-05-03 DIAGNOSIS — I429 Cardiomyopathy, unspecified: Secondary | ICD-10-CM | POA: Diagnosis not present

## 2016-05-03 DIAGNOSIS — Z7984 Long term (current) use of oral hypoglycemic drugs: Secondary | ICD-10-CM | POA: Diagnosis not present

## 2016-05-03 DIAGNOSIS — N183 Chronic kidney disease, stage 3 (moderate): Secondary | ICD-10-CM | POA: Diagnosis not present

## 2016-05-03 DIAGNOSIS — Z0000 Encounter for general adult medical examination without abnormal findings: Secondary | ICD-10-CM | POA: Diagnosis not present

## 2016-05-03 DIAGNOSIS — I5022 Chronic systolic (congestive) heart failure: Secondary | ICD-10-CM | POA: Diagnosis not present

## 2016-05-03 DIAGNOSIS — E782 Mixed hyperlipidemia: Secondary | ICD-10-CM | POA: Diagnosis not present

## 2016-05-03 DIAGNOSIS — E1122 Type 2 diabetes mellitus with diabetic chronic kidney disease: Secondary | ICD-10-CM | POA: Diagnosis not present

## 2016-05-03 DIAGNOSIS — M545 Low back pain: Secondary | ICD-10-CM | POA: Diagnosis not present

## 2016-05-03 DIAGNOSIS — Z6836 Body mass index (BMI) 36.0-36.9, adult: Secondary | ICD-10-CM | POA: Diagnosis not present

## 2016-05-03 DIAGNOSIS — I5042 Chronic combined systolic (congestive) and diastolic (congestive) heart failure: Secondary | ICD-10-CM

## 2016-05-03 DIAGNOSIS — I130 Hypertensive heart and chronic kidney disease with heart failure and stage 1 through stage 4 chronic kidney disease, or unspecified chronic kidney disease: Secondary | ICD-10-CM | POA: Diagnosis not present

## 2016-05-03 DIAGNOSIS — Z125 Encounter for screening for malignant neoplasm of prostate: Secondary | ICD-10-CM | POA: Diagnosis not present

## 2016-05-03 NOTE — Progress Notes (Signed)
Remote ICD transmission.   

## 2016-05-04 ENCOUNTER — Encounter: Payer: Self-pay | Admitting: Cardiology

## 2016-05-07 LAB — CUP PACEART REMOTE DEVICE CHECK
Battery Remaining Longevity: 90 mo
Battery Voltage: 2.99 V
Brady Statistic AP VP Percent: 39.48 %
Brady Statistic AP VS Percent: 0.61 %
Brady Statistic AS VP Percent: 58.95 %
Brady Statistic AS VS Percent: 0.97 %
Brady Statistic RA Percent Paced: 39.06 %
Brady Statistic RV Percent Paced: 3.92 %
Date Time Interrogation Session: 20180129072823
HighPow Impedance: 57 Ohm
HighPow Impedance: 73 Ohm
Implantable Lead Implant Date: 20060323
Implantable Lead Implant Date: 20060323
Implantable Lead Implant Date: 20110420
Implantable Lead Location: 753858
Implantable Lead Location: 753859
Implantable Lead Location: 753860
Implantable Lead Model: 4193
Implantable Lead Model: 5076
Implantable Lead Model: 7121
Implantable Pulse Generator Implant Date: 20160613
Lead Channel Impedance Value: 285 Ohm
Lead Channel Impedance Value: 4047 Ohm
Lead Channel Impedance Value: 4047 Ohm
Lead Channel Impedance Value: 418 Ohm
Lead Channel Impedance Value: 513 Ohm
Lead Channel Impedance Value: 532 Ohm
Lead Channel Pacing Threshold Amplitude: 0.5 V
Lead Channel Pacing Threshold Amplitude: 0.875 V
Lead Channel Pacing Threshold Amplitude: 1.125 V
Lead Channel Pacing Threshold Pulse Width: 0.4 ms
Lead Channel Pacing Threshold Pulse Width: 0.4 ms
Lead Channel Pacing Threshold Pulse Width: 0.6 ms
Lead Channel Sensing Intrinsic Amplitude: 16.125 mV
Lead Channel Sensing Intrinsic Amplitude: 16.125 mV
Lead Channel Sensing Intrinsic Amplitude: 3 mV
Lead Channel Sensing Intrinsic Amplitude: 3 mV
Lead Channel Setting Pacing Amplitude: 1.5 V
Lead Channel Setting Pacing Amplitude: 1.5 V
Lead Channel Setting Pacing Amplitude: 2.25 V
Lead Channel Setting Pacing Pulse Width: 0.4 ms
Lead Channel Setting Pacing Pulse Width: 0.6 ms
Lead Channel Setting Sensing Sensitivity: 0.6 mV

## 2016-06-03 ENCOUNTER — Encounter: Payer: Self-pay | Admitting: Nurse Practitioner

## 2016-06-03 DIAGNOSIS — J069 Acute upper respiratory infection, unspecified: Secondary | ICD-10-CM | POA: Diagnosis not present

## 2016-06-15 ENCOUNTER — Other Ambulatory Visit: Payer: Self-pay | Admitting: Internal Medicine

## 2016-06-16 NOTE — Telephone Encounter (Signed)
Per 08/04/15 office note  Discontinued Medications    Reason for Discontinue  spironolactone (ALDACTONE) 25 MG tablet Completed Course   Please advise on refill request. Thanks, MI

## 2016-06-17 NOTE — Telephone Encounter (Signed)
New Message    *STAT* If patient is at the pharmacy, call can be transferred to refill team.   1. Which medications need to be refilled? (please list name of each medication and dose if known)  spironolactone (ALDACTONE) 25 MG tablet Take 12.5 mg by mouth daily.     2. Which pharmacy/location (including street and city if local pharmacy) is medication to be sent to? [facility] in [place]  3. Do they need a 30 day or 90 day supply?  Only call in 60 day supply or 45 day supply, because he cuts the pill in half

## 2016-06-17 NOTE — Telephone Encounter (Signed)
When Dr. [name] last saw him 08/04/15- he was taking spironolactone 25 mg - 1/2 tablet (12.5 mg) by mouth once daily. This was not changed at his office visit.  Please call to verify if the patient is taking a 1/2 tablet as this is what we last had him on.  Thanks!

## 2016-06-17 NOTE — Telephone Encounter (Signed)
Follow Up   Pt states he is still only taking 1/2 pill

## 2016-06-30 ENCOUNTER — Other Ambulatory Visit: Payer: Self-pay | Admitting: Internal Medicine

## 2016-07-20 ENCOUNTER — Other Ambulatory Visit: Payer: Self-pay | Admitting: Family Medicine

## 2016-07-20 ENCOUNTER — Ambulatory Visit
Admission: RE | Admit: 2016-07-20 | Discharge: 2016-07-20 | Disposition: A | Discharge status: Home / Self Care | Payer: Medicare Other | Source: Ambulatory Visit | Attending: Family Medicine | Admitting: Family Medicine

## 2016-07-20 DIAGNOSIS — R05 Cough: Secondary | ICD-10-CM

## 2016-07-20 DIAGNOSIS — J45909 Unspecified asthma, uncomplicated: Secondary | ICD-10-CM | POA: Diagnosis not present

## 2016-07-20 DIAGNOSIS — R059 Cough, unspecified: Secondary | ICD-10-CM

## 2016-07-20 DIAGNOSIS — J309 Allergic rhinitis, unspecified: Secondary | ICD-10-CM | POA: Diagnosis not present

## 2016-07-26 NOTE — Progress Notes (Signed)
Electrophysiology Office Note Date: 08/03/2016  ID:  Russell Fuentes, DOB May 05, 1950, MRN [id number]  PCP: [name], MD Electrophysiologist: [name]  CC: Routine ICD follow up  Russell Fuentes is a 66 y.o. male is seen today for Dr [name].  He had a CRTD implanted 2006 for NICM and CHF and underwent generator change 2011 and 2017. Since last being seen in our clinic, the patient reports doing relatively well.  He is working on losing weight.  He has had persistent orthostatic intolerance despite decreasing Diovan.  He denies chest pain, palpitations, dyspnea, PND, orthopnea, nausea, vomiting, syncope, edema, weight gain, or early satiety.  He has not had ICD shocks.   Device History: MDT CRTD implanted 2006 for NICM and CHF; gen change 2011 with addition of new RV lead (previously implanted [phone] lead abandoned); gen change 2017 History of appropriate therapy: No History of AAD therapy: No   Past Medical History:  Diagnosis Date  . Chronic combined systolic and diastolic heart failure ([place])    . Diabetes mellitus with neuropathy ([place])    . Nonischemic cardiomyopathy ([place])     a. s/p MDT CRTD  . Orthostatic lightheadedness     Past Surgical History:  Procedure Laterality Date  . CARDIAC CATHETERIZATION  12/31/1998   Bi-plane cine left ventriculography revealed low-normal left ventricular function with an ejection fraction in the 45% to 50% range.  . CORONARY ARTERY BYPASS GRAFT    . DOPPLER ECHOCARDIOGRAPHY  04/28/2011   LVEF 41% by simpson's borderline concentric LVH, global hypokinesis with regional variation. Stage 1 diastolic dysfunction, normal LV filling pressure. Normal LA size. Right heart pacing wires noted. Trace MR, TR. Normal RVSP  . EP IMPLANTABLE DEVICE N/A 09/16/2014   Procedure: ICD Generator Changeout;  Surgeon: [name], MD;  Location: [facility] CV LAB;  Service: Cardiovascular;  Laterality: N/A;  . LEFT HEART CATHETERIZATION WITH CORONARY ANGIOGRAM N/A  12/11/2013   Procedure: LEFT HEART CATHETERIZATION WITH CORONARY ANGIOGRAM;  Surgeon: [name], MD;  Location: [facility] CATH LAB;  Service: Cardiovascular;  Laterality: N/A;  . NM MYOCAR PERF EJECTION FRACTION  04/03/2009, 11/18/2005   The post stress myocardial perfusion images show a normal pattern of perfusion in all regions. The post-stress ejection fraction is 50%. No significant wall motion abnormalities noted. This is a low risk scan.    Current Outpatient Prescriptions  Medication Sig Dispense Refill  . aspirin 81 MG tablet Take 81 mg by mouth daily.    . carvedilol (COREG) 25 MG tablet TAKE 1 TABLET(25 MG) BY MOUTH TWICE DAILY 180 tablet 3  . glimepiride (AMARYL) 4 MG tablet Take 4 mg by mouth daily.     [name] losartan (COZAAR) 100 MG tablet Take 100 mg by mouth daily.    [name] spironolactone (ALDACTONE) 25 MG tablet Take 12.5 mg by mouth daily.  2  . spironolactone (ALDACTONE) 25 MG tablet Take 0.5 tablets (12.5 mg total) by mouth daily. 45 tablet 0  . valsartan (DIOVAN) 80 MG tablet TAKE 1 TABLET(80 MG) BY MOUTH DAILY 90 tablet 0   No current facility-administered medications for this visit.     Allergies:   Codeine   Social History: Social History   Social History  . Marital status: Married    Spouse name: N/A  . Number of children: 3  . Years of education: 12   Occupational History  . Physiological scientist    Social History Main Topics  . Smoking status: Never Smoker  .  Smokeless tobacco: Never Used  . Alcohol use No  . Drug use: Unknown  . Sexual activity: Not on file   Other Topics Concern  . Not on file   Social History Narrative  . No narrative on file    Family History: Family History  Problem Relation Age of Onset  . Asthma Sister   . Asthma Brother   . Heart attack Father   . Heart attack Sister   . Cancer Mother   . Heart attack Mother   . Cancer Sister   . Hypertension Brother     Review of Systems: All other systems reviewed and are otherwise  negative except as noted above.   Physical Exam: VS:  BP 128/78   Pulse 75   Ht 5\' 11"  (1.803 m)   Wt 249 lb 6 oz (113.1 kg)   BMI 34.78 kg/m  , BMI Body mass index is 34.78 kg/m.  GEN- The patient is obese appearing, alert and oriented x 3 today.   HEENT: normocephalic, atraumatic; sclera clear, conjunctiva pink; hearing intact; oropharynx clear; neck supple Lungs- Clear to ausculation bilaterally, normal work of breathing.  No wheezes, rales, rhonchi Heart- Regular rate and rhythm, no murmurs, rubs or gallops GI- soft, non-tender, non-distended, bowel sounds present Extremities- no clubbing, cyanosis, or edema  MS- no significant deformity or atrophy Skin- warm and dry, no rash or lesion; ICD pocket well healed Psych- euthymic mood, full affect Neuro- strength and sensation are intact  ICD interrogation- reviewed in detail today,  See PACEART report  EKG:  EKG is ordered today. The ekg ordered today shows sinus rhythm with V pacing   Recent Labs: No results found for requested labs within last 8760 hours.   Wt Readings from Last 3 Encounters:  08/03/16 249 lb 6 oz (113.1 kg)  08/04/15 254 lb (115.2 kg)  09/16/14 255 lb (115.7 kg)     Other studies Reviewed: Additional studies/ records that were reviewed today include: Dr Olin Pia office note, cardiac catheterization report  Assessment and Plan:  1.  Chronic systolic dysfunction euvolemic today Stable on an appropriate medical regimen Normal ICD function See Pace Art report No changes today Will update echo   2.  Obesity Body mass index is 34.78 kg/m. Continued weight loss encouraged   3.  Orthostatic hypotension Decrease Diovan to 40mg  daily May need to discontinue if persistent hypotension  Current medicines are reviewed at length with the patient today.   The patient does not have concerns regarding his medicines.  The following changes were made today:  Decrease diovan to 40mg  daily  Labs/ tests  ordered today include: echo   Disposition:   Follow up with [facility], Dr [name] 1 year   Signed, Chanetta Marshall, NP 08/03/2016 10:10 AM  [facility] [address] [place]  [address] [phone] (office) [phone] (fax)

## 2016-08-03 ENCOUNTER — Ambulatory Visit (INDEPENDENT_AMBULATORY_CARE_PROVIDER_SITE_OTHER): Payer: Medicare Other | Admitting: Nurse Practitioner

## 2016-08-03 ENCOUNTER — Encounter (INDEPENDENT_AMBULATORY_CARE_PROVIDER_SITE_OTHER): Payer: Self-pay

## 2016-08-03 ENCOUNTER — Encounter: Payer: Self-pay | Admitting: Nurse Practitioner

## 2016-08-03 VITALS — BP 128/78 | HR 75 | Ht 71.0 in | Wt 249.4 lb

## 2016-08-03 DIAGNOSIS — I5022 Chronic systolic (congestive) heart failure: Secondary | ICD-10-CM

## 2016-08-03 DIAGNOSIS — I951 Orthostatic hypotension: Secondary | ICD-10-CM

## 2016-08-03 LAB — CUP PACEART INCLINIC DEVICE CHECK
Implantable Lead Implant Date: 20110420
Implantable Lead Location: 753859
Implantable Lead Model: 4193
Implantable Lead Model: 5076
Implantable Lead Model: 7121
MDC IDC LEAD IMPLANT DT: 20060323
MDC IDC LEAD IMPLANT DT: 20060323
MDC IDC LEAD LOCATION: 753858
MDC IDC LEAD LOCATION: 753860
MDC IDC PG IMPLANT DT: 20160613
MDC IDC SESS DTM: 20180501091606

## 2016-08-03 NOTE — Patient Instructions (Signed)
Medication Instructions:  None Ordered   Labwork: None Ordered   Testing/Procedures: Your physician has requested that you have an echocardiogram. Echocardiography is a painless test that uses sound waves to create images of your heart. It provides your doctor with information about the size and shape of your heart and how well your heart's chambers and valves are working. This procedure takes approximately one hour. There are no restrictions for this procedure.    Follow-Up: Your physician wants you to follow-up in: 1 year with Dr. [name].  You will receive a reminder letter in the mail two months in advance. If you don't receive a letter, please call our office to schedule the follow-up appointment.   Any Other Special Instructions Will Be Listed Below (If Applicable).     If you need a refill on your cardiac medications before your next appointment, please call your pharmacy.

## 2016-08-04 ENCOUNTER — Encounter: Payer: Medicare Other | Admitting: Nurse Practitioner

## 2016-08-06 ENCOUNTER — Encounter: Payer: Medicare Other | Admitting: Nurse Practitioner

## 2016-08-10 ENCOUNTER — Ambulatory Visit (HOSPITAL_COMMUNITY): Payer: Medicare Other | Attending: Cardiovascular Disease

## 2016-08-10 ENCOUNTER — Other Ambulatory Visit: Payer: Self-pay

## 2016-08-10 DIAGNOSIS — E119 Type 2 diabetes mellitus without complications: Secondary | ICD-10-CM | POA: Diagnosis not present

## 2016-08-10 DIAGNOSIS — I517 Cardiomegaly: Secondary | ICD-10-CM | POA: Insufficient documentation

## 2016-08-10 DIAGNOSIS — I5022 Chronic systolic (congestive) heart failure: Secondary | ICD-10-CM | POA: Diagnosis not present

## 2016-08-10 DIAGNOSIS — Z951 Presence of aortocoronary bypass graft: Secondary | ICD-10-CM | POA: Diagnosis not present

## 2016-08-10 DIAGNOSIS — I428 Other cardiomyopathies: Secondary | ICD-10-CM | POA: Insufficient documentation

## 2016-08-10 DIAGNOSIS — Z8249 Family history of ischemic heart disease and other diseases of the circulatory system: Secondary | ICD-10-CM | POA: Insufficient documentation

## 2016-08-18 ENCOUNTER — Other Ambulatory Visit: Payer: Self-pay | Admitting: *Deleted

## 2016-08-18 DIAGNOSIS — I5022 Chronic systolic (congestive) heart failure: Secondary | ICD-10-CM

## 2016-08-29 NOTE — Progress Notes (Unsigned)
    AV and VV Optimization Echo Note Date: 08/29/2016  ID:  Russell Fuentes, DOB 01-Jul-1950, MRN [id number]  PCP: [name], MD Electrophysiologist: [name] is a 66 y.o. male is seen today for Dr [name].  He presents today for AV optimization of CRT.  Since last being seen in our clinic, the patient reports *** doing very well.    The patient is predominately atrially sensed/paced***.  Device was programmed with a  PAV of *** and SAV of *** on presentation.  *** AV delays were evaluated from *** msec to *** msec.  Optimal separation of E/A waves was noted at *** msec.    Device was reprogrammed today to a SAV of ***msec and a PAV of ***msec.    EKG's were evaluated with LV offset from *** to ***msec.  The patient will return to see me*** in clinic in 4 weeks for follow-up.   Signed, Chanetta Marshall, NP  08/29/2016 10:32 AM     [facility] [address] [place] [address] [phone] (office) [phone] (fax)

## 2016-09-02 ENCOUNTER — Ambulatory Visit (HOSPITAL_COMMUNITY): Payer: Medicare Other | Attending: Internal Medicine

## 2016-09-02 ENCOUNTER — Encounter: Payer: Medicare Other | Admitting: Nurse Practitioner

## 2016-09-02 ENCOUNTER — Other Ambulatory Visit: Payer: Self-pay

## 2016-09-02 DIAGNOSIS — E119 Type 2 diabetes mellitus without complications: Secondary | ICD-10-CM | POA: Insufficient documentation

## 2016-09-02 DIAGNOSIS — Z8249 Family history of ischemic heart disease and other diseases of the circulatory system: Secondary | ICD-10-CM | POA: Diagnosis not present

## 2016-09-02 DIAGNOSIS — I5022 Chronic systolic (congestive) heart failure: Secondary | ICD-10-CM

## 2016-09-02 DIAGNOSIS — I429 Cardiomyopathy, unspecified: Secondary | ICD-10-CM | POA: Insufficient documentation

## 2016-09-13 ENCOUNTER — Other Ambulatory Visit: Payer: Self-pay | Admitting: Internal Medicine

## 2016-10-22 ENCOUNTER — Other Ambulatory Visit: Payer: Self-pay | Admitting: Internal Medicine

## 2016-10-22 ENCOUNTER — Telehealth: Payer: Self-pay | Admitting: Internal Medicine

## 2016-10-22 NOTE — Telephone Encounter (Signed)
New message     Pt c/o medication issue:  1. Name of Medication:  Valsartan  2. How are you currently taking this medication (dosage and times per day)?  40 mg   3. Are you having a reaction (difficulty breathing--STAT)? no  4. What is your medication issue?  Is it ok to stop medication since his bp is running low and they recalled the medication   126/65 132/70

## 2016-10-22 NOTE — Telephone Encounter (Signed)
Called patient with pharmacy's recommendations. Patient will keep track of his BP's and will call if trending above 130/80. Informed patient message has been sent to Dr. [name], so he is aware and if he has any further advisement.

## 2016-10-22 NOTE — Telephone Encounter (Signed)
It patient still hypotensive should be ok to discontinue. Would recommend he continue to monitor for at least 3-4 weeks after discontinuation to ensure pressures do not trend above 130/80. If so he should call to be seen.

## 2016-10-22 NOTE — Telephone Encounter (Signed)
Patient wants to know if he even needs to take the Diovan since he has lost weight and his BP is doing well. BP 118/66 HR 75  Last office visit on 08/03/16 with Chanetta Marshall NP stated  " 3.  Orthostatic hypotension Decrease Diovan to 40mg  daily May need to discontinue if persistent hypotension"   Will forward to Pharmacy for advisement.

## 2016-10-27 NOTE — Telephone Encounter (Signed)
Will continue to observe   Can wait until to next office visit to discuss resuming but lets leeve off for now

## 2016-10-28 NOTE — Telephone Encounter (Signed)
I called and spoke with patient. He is aware of Dr. Olin Pia recommendations to remain off diovan.  He will continue to monitor his BP and call should his readings remain consistently 140/80 or above.

## 2016-11-02 ENCOUNTER — Telehealth: Payer: Self-pay | Admitting: Cardiology

## 2016-11-02 ENCOUNTER — Ambulatory Visit (INDEPENDENT_AMBULATORY_CARE_PROVIDER_SITE_OTHER): Payer: Medicare Other | Admitting: *Deleted

## 2016-11-02 DIAGNOSIS — I5022 Chronic systolic (congestive) heart failure: Secondary | ICD-10-CM | POA: Diagnosis not present

## 2016-11-02 DIAGNOSIS — I428 Other cardiomyopathies: Secondary | ICD-10-CM

## 2016-11-02 NOTE — Telephone Encounter (Signed)
LMOVM reminding pt to send remote transmission.   

## 2016-11-03 NOTE — Progress Notes (Signed)
Remote ICD transmission.   

## 2016-11-04 LAB — CUP PACEART REMOTE DEVICE CHECK
Battery Remaining Longevity: 70 mo
Battery Voltage: 2.97 V
Brady Statistic AP VP Percent: 42.37 %
Brady Statistic AS VP Percent: 57.1 %
Brady Statistic RA Percent Paced: 42.22 %
HIGH POWER IMPEDANCE MEASURED VALUE: 72 Ohm
HighPow Impedance: 51 Ohm
Implantable Lead Implant Date: 20110420
Implantable Lead Location: 753858
Implantable Lead Model: 5076
Implantable Lead Model: 7121
Lead Channel Impedance Value: 4047 Ohm
Lead Channel Impedance Value: 513 Ohm
Lead Channel Impedance Value: 532 Ohm
Lead Channel Pacing Threshold Amplitude: 0.5 V
Lead Channel Pacing Threshold Pulse Width: 0.4 ms
Lead Channel Pacing Threshold Pulse Width: 0.4 ms
Lead Channel Pacing Threshold Pulse Width: 0.6 ms
Lead Channel Sensing Intrinsic Amplitude: 20.625 mV
Lead Channel Sensing Intrinsic Amplitude: 3.25 mV
Lead Channel Setting Pacing Amplitude: 1.5 V
Lead Channel Setting Pacing Pulse Width: 0.4 ms
MDC IDC LEAD IMPLANT DT: 20060323
MDC IDC LEAD IMPLANT DT: 20060323
MDC IDC LEAD LOCATION: 753859
MDC IDC LEAD LOCATION: 753860
MDC IDC MSMT LEADCHNL LV IMPEDANCE VALUE: 4047 Ohm
MDC IDC MSMT LEADCHNL LV PACING THRESHOLD AMPLITUDE: 1.875 V
MDC IDC MSMT LEADCHNL RA SENSING INTR AMPL: 3.25 mV
MDC IDC MSMT LEADCHNL RV IMPEDANCE VALUE: 342 Ohm
MDC IDC MSMT LEADCHNL RV IMPEDANCE VALUE: 475 Ohm
MDC IDC MSMT LEADCHNL RV PACING THRESHOLD AMPLITUDE: 1.25 V
MDC IDC MSMT LEADCHNL RV SENSING INTR AMPL: 20.625 mV
MDC IDC PG IMPLANT DT: 20160613
MDC IDC SESS DTM: 20180731220617
MDC IDC SET LEADCHNL LV PACING AMPLITUDE: 2.5 V
MDC IDC SET LEADCHNL LV PACING PULSEWIDTH: 0.6 ms
MDC IDC SET LEADCHNL RV PACING AMPLITUDE: 2.75 V
MDC IDC SET LEADCHNL RV SENSING SENSITIVITY: 0.6 mV
MDC IDC STAT BRADY AP VS PERCENT: 0.05 %
MDC IDC STAT BRADY AS VS PERCENT: 0.48 %
MDC IDC STAT BRADY RV PERCENT PACED: 98.82 %

## 2016-11-05 ENCOUNTER — Encounter: Payer: Self-pay | Admitting: Cardiology

## 2016-11-22 DIAGNOSIS — H0019 Chalazion unspecified eye, unspecified eyelid: Secondary | ICD-10-CM | POA: Diagnosis not present

## 2016-11-22 DIAGNOSIS — I5022 Chronic systolic (congestive) heart failure: Secondary | ICD-10-CM | POA: Diagnosis not present

## 2016-11-22 DIAGNOSIS — I130 Hypertensive heart and chronic kidney disease with heart failure and stage 1 through stage 4 chronic kidney disease, or unspecified chronic kidney disease: Secondary | ICD-10-CM | POA: Diagnosis not present

## 2016-11-22 DIAGNOSIS — I429 Cardiomyopathy, unspecified: Secondary | ICD-10-CM | POA: Diagnosis not present

## 2016-11-22 DIAGNOSIS — E782 Mixed hyperlipidemia: Secondary | ICD-10-CM | POA: Diagnosis not present

## 2016-11-22 DIAGNOSIS — E1122 Type 2 diabetes mellitus with diabetic chronic kidney disease: Secondary | ICD-10-CM | POA: Diagnosis not present

## 2016-11-22 DIAGNOSIS — N183 Chronic kidney disease, stage 3 (moderate): Secondary | ICD-10-CM | POA: Diagnosis not present

## 2016-12-07 ENCOUNTER — Telehealth: Payer: Self-pay

## 2016-12-07 NOTE — Telephone Encounter (Signed)
Patient called stating his valsartan was d/c'd due to a recall. However he was told to monitor his BP and he notices that it has been elevated with the heights at 145/88. He reports some intermittent dizziness over the weekend. He is requested recommendations for a new BP medication.

## 2016-12-08 ENCOUNTER — Telehealth: Payer: Self-pay | Admitting: Cardiology

## 2016-12-08 NOTE — Telephone Encounter (Signed)
Patient following up on call from Tuesday 12-07-16. Informed pt that MD nurse is aware of the issue and will call him as soon as she can but she is not here today and she will back on Thursday 12-09-16. Pt verbalized understanding.

## 2016-12-09 NOTE — Telephone Encounter (Signed)
Jimmey Ralph, RN      12/07/16 9:14 AM  Note    Patient called stating his valsartan was d/c'd due to a recall. However he was told to monitor his BP and he notices that it has been elevated with the heights at 145/88. He reports some intermittent dizziness over the weekend. He is requested recommendations for a new BP medication.        Note sent from [place] on 12/07/16. Will forward to Dr. [name] to review.

## 2016-12-10 NOTE — Telephone Encounter (Signed)
I spoke with the patient. He states that his BP has only been 145/88 once- this occurred Sunday at church and he became dizzy with this. Otherwise, his BP has been running mainly in the 130's/ 70-80's.  He denies any further symptoms of dizziness.  He states that when he was on valsartan, he was down to taking 40 mg once daily. When he took losartan, he developed some sinus issues with this and was placed back on valsartan (done in 2014). The patient would prefer not to go on losartan.  I advised him the other recommendation from Dr. [name] was irbesartan 75 mg once daily, but this is more equivalent to valsartan 80 mg once daily.  I have advised the patient that I am uncertain if irbesartan can be cut in half (does not appear to be scored)- will review with pharmacy.  Will also review with Dr. [name] any additional alternatives as the equivalent to valsartan 40 mg appears to be losartan 25 mg once daily.  He is aware to continue to monitor his BP 1-2 times daily. I will follow up with Dr [name] and call him back after reviewing with him. He is agreeable.

## 2016-12-10 NOTE — Telephone Encounter (Signed)
He was on losartan before,  If he can do that lets try 25 bid and recheck BMET If he cant take that lets try irbesartan 75  thnaks

## 2016-12-10 NOTE — Telephone Encounter (Signed)
I left a message for the patient to call. 

## 2016-12-10 NOTE — Telephone Encounter (Signed)
Ok to cut irbesartan 75mg  in half - agree that 37.5mg  dosing would be most equivalent with valsartan 40mg  daily.

## 2017-01-03 DIAGNOSIS — Z85828 Personal history of other malignant neoplasm of skin: Secondary | ICD-10-CM | POA: Diagnosis not present

## 2017-01-03 DIAGNOSIS — L821 Other seborrheic keratosis: Secondary | ICD-10-CM | POA: Diagnosis not present

## 2017-01-03 DIAGNOSIS — L812 Freckles: Secondary | ICD-10-CM | POA: Diagnosis not present

## 2017-01-03 DIAGNOSIS — D1801 Hemangioma of skin and subcutaneous tissue: Secondary | ICD-10-CM | POA: Diagnosis not present

## 2017-01-03 DIAGNOSIS — L570 Actinic keratosis: Secondary | ICD-10-CM | POA: Diagnosis not present

## 2017-01-14 DIAGNOSIS — M47816 Spondylosis without myelopathy or radiculopathy, lumbar region: Secondary | ICD-10-CM | POA: Diagnosis not present

## 2017-01-21 DIAGNOSIS — H524 Presbyopia: Secondary | ICD-10-CM | POA: Diagnosis not present

## 2017-01-21 DIAGNOSIS — H5203 Hypermetropia, bilateral: Secondary | ICD-10-CM | POA: Diagnosis not present

## 2017-01-21 DIAGNOSIS — H52223 Regular astigmatism, bilateral: Secondary | ICD-10-CM | POA: Diagnosis not present

## 2017-01-31 DIAGNOSIS — M25562 Pain in left knee: Secondary | ICD-10-CM | POA: Diagnosis not present

## 2017-01-31 DIAGNOSIS — M25561 Pain in right knee: Secondary | ICD-10-CM | POA: Diagnosis not present

## 2017-02-01 ENCOUNTER — Ambulatory Visit (INDEPENDENT_AMBULATORY_CARE_PROVIDER_SITE_OTHER): Payer: Medicare Other | Admitting: *Deleted

## 2017-02-01 DIAGNOSIS — I428 Other cardiomyopathies: Secondary | ICD-10-CM

## 2017-02-01 DIAGNOSIS — I5022 Chronic systolic (congestive) heart failure: Secondary | ICD-10-CM | POA: Diagnosis not present

## 2017-02-01 NOTE — Progress Notes (Signed)
Remote ICD transmission.   

## 2017-02-02 DIAGNOSIS — M47816 Spondylosis without myelopathy or radiculopathy, lumbar region: Secondary | ICD-10-CM | POA: Diagnosis not present

## 2017-02-03 LAB — CUP PACEART REMOTE DEVICE CHECK
Battery Remaining Longevity: 52 mo
Brady Statistic AP VP Percent: 42.02 %
Brady Statistic AP VS Percent: 0.05 %
Brady Statistic AS VS Percent: 0.19 %
Brady Statistic RA Percent Paced: 41.96 %
Brady Statistic RV Percent Paced: 99.39 %
HIGH POWER IMPEDANCE MEASURED VALUE: 52 Ohm
HighPow Impedance: 62 Ohm
Implantable Lead Implant Date: 20060323
Implantable Lead Implant Date: 20110420
Implantable Lead Location: 753858
Implantable Lead Location: 753859
Implantable Lead Model: 4193
Lead Channel Impedance Value: 4047 Ohm
Lead Channel Impedance Value: 4047 Ohm
Lead Channel Impedance Value: 456 Ohm
Lead Channel Impedance Value: 589 Ohm
Lead Channel Pacing Threshold Amplitude: 0.5 V
Lead Channel Pacing Threshold Amplitude: 1.375 V
Lead Channel Sensing Intrinsic Amplitude: 3.625 mV
Lead Channel Setting Pacing Amplitude: 1.5 V
Lead Channel Setting Pacing Amplitude: 2.75 V
Lead Channel Setting Pacing Pulse Width: 0.4 ms
Lead Channel Setting Sensing Sensitivity: 0.6 mV
MDC IDC LEAD IMPLANT DT: 20060323
MDC IDC LEAD LOCATION: 753860
MDC IDC MSMT BATTERY VOLTAGE: 2.96 V
MDC IDC MSMT LEADCHNL LV IMPEDANCE VALUE: 456 Ohm
MDC IDC MSMT LEADCHNL LV PACING THRESHOLD PULSEWIDTH: 0.6 ms
MDC IDC MSMT LEADCHNL RA IMPEDANCE VALUE: 513 Ohm
MDC IDC MSMT LEADCHNL RA PACING THRESHOLD PULSEWIDTH: 0.4 ms
MDC IDC MSMT LEADCHNL RA SENSING INTR AMPL: 3.625 mV
MDC IDC MSMT LEADCHNL RV PACING THRESHOLD AMPLITUDE: 0.875 V
MDC IDC MSMT LEADCHNL RV PACING THRESHOLD PULSEWIDTH: 0.4 ms
MDC IDC MSMT LEADCHNL RV SENSING INTR AMPL: 25.75 mV
MDC IDC MSMT LEADCHNL RV SENSING INTR AMPL: 25.75 mV
MDC IDC PG IMPLANT DT: 20160613
MDC IDC SESS DTM: 20181030073323
MDC IDC SET LEADCHNL LV PACING AMPLITUDE: 2.75 V
MDC IDC SET LEADCHNL LV PACING PULSEWIDTH: 0.6 ms
MDC IDC STAT BRADY AS VP PERCENT: 57.74 %

## 2017-02-06 ENCOUNTER — Encounter (HOSPITAL_COMMUNITY): Payer: Self-pay | Admitting: Emergency Medicine

## 2017-02-06 ENCOUNTER — Emergency Department (HOSPITAL_COMMUNITY)
Admission: EM | Admit: 2017-02-06 | Discharge: 2017-02-06 | Disposition: A | Discharge status: Home / Self Care | Payer: Medicare Other | Attending: Emergency Medicine | Admitting: Emergency Medicine

## 2017-02-06 ENCOUNTER — Emergency Department (HOSPITAL_COMMUNITY): Payer: Medicare Other

## 2017-02-06 DIAGNOSIS — Z7982 Long term (current) use of aspirin: Secondary | ICD-10-CM | POA: Diagnosis not present

## 2017-02-06 DIAGNOSIS — Z951 Presence of aortocoronary bypass graft: Secondary | ICD-10-CM | POA: Diagnosis not present

## 2017-02-06 DIAGNOSIS — E119 Type 2 diabetes mellitus without complications: Secondary | ICD-10-CM | POA: Diagnosis not present

## 2017-02-06 DIAGNOSIS — R0789 Other chest pain: Secondary | ICD-10-CM

## 2017-02-06 DIAGNOSIS — K859 Acute pancreatitis without necrosis or infection, unspecified: Secondary | ICD-10-CM

## 2017-02-06 DIAGNOSIS — I5042 Chronic combined systolic (congestive) and diastolic (congestive) heart failure: Secondary | ICD-10-CM | POA: Insufficient documentation

## 2017-02-06 DIAGNOSIS — R079 Chest pain, unspecified: Secondary | ICD-10-CM | POA: Diagnosis not present

## 2017-02-06 DIAGNOSIS — R1012 Left upper quadrant pain: Secondary | ICD-10-CM | POA: Diagnosis not present

## 2017-02-06 DIAGNOSIS — Z79899 Other long term (current) drug therapy: Secondary | ICD-10-CM | POA: Diagnosis not present

## 2017-02-06 DIAGNOSIS — R072 Precordial pain: Secondary | ICD-10-CM | POA: Insufficient documentation

## 2017-02-06 DIAGNOSIS — Z7984 Long term (current) use of oral hypoglycemic drugs: Secondary | ICD-10-CM | POA: Insufficient documentation

## 2017-02-06 DIAGNOSIS — Z95810 Presence of automatic (implantable) cardiac defibrillator: Secondary | ICD-10-CM | POA: Diagnosis not present

## 2017-02-06 LAB — BASIC METABOLIC PANEL
Anion gap: 7 (ref 5–15)
BUN: 15 mg/dL (ref 6–20)
CALCIUM: 8.4 mg/dL — AB (ref 8.9–10.3)
CO2: 25 mmol/L (ref 22–32)
Chloride: 102 mmol/L (ref 101–111)
Creatinine, Ser: 1.15 mg/dL (ref 0.61–1.24)
GFR calc Af Amer: >60 mL/min (ref >60)
Glucose, Bld: 224 mg/dL — ABNORMAL HIGH (ref 65–99)
POTASSIUM: 3.7 mmol/L (ref 3.5–5.1)
SODIUM: 134 mmol/L — AB (ref 135–145)

## 2017-02-06 LAB — CBC
HCT: 44.4 % (ref 39.0–52.0)
HEMOGLOBIN: 15.5 g/dL (ref 13.0–17.0)
MCH: 29.6 pg (ref 26.0–34.0)
MCHC: 34.9 g/dL (ref 30.0–36.0)
MCV: 84.9 fL (ref 78.0–100.0)
Platelets: 161 10*3/uL (ref 150–400)
RBC: 5.23 MIL/uL (ref 4.22–5.81)
RDW: 12.8 % (ref 11.5–15.5)
WBC: 8.7 10*3/uL (ref 4.0–10.5)

## 2017-02-06 LAB — I-STAT TROPONIN, ED
TROPONIN I, POC: 0 ng/mL (ref 0.00–0.08)
TROPONIN I, POC: 0 ng/mL (ref 0.00–0.08)

## 2017-02-06 LAB — HEPATIC FUNCTION PANEL
ALBUMIN: 3.4 g/dL — AB (ref 3.5–5.0)
ALT: 17 U/L (ref 17–63)
AST: 14 U/L — ABNORMAL LOW (ref 15–41)
Alkaline Phosphatase: 47 U/L (ref 38–126)
BILIRUBIN INDIRECT: 0.7 mg/dL (ref 0.3–0.9)
BILIRUBIN TOTAL: 1 mg/dL (ref 0.3–1.2)
Bilirubin, Direct: 0.3 mg/dL (ref 0.1–0.5)
TOTAL PROTEIN: 6.2 g/dL — AB (ref 6.5–8.1)

## 2017-02-06 LAB — PROTIME-INR
INR: 1.09
PROTHROMBIN TIME: 14 s (ref 11.4–15.2)

## 2017-02-06 LAB — MAGNESIUM: MAGNESIUM: 2.1 mg/dL (ref 1.7–2.4)

## 2017-02-06 LAB — LIPASE, BLOOD: Lipase: 275 U/L — ABNORMAL HIGH (ref 11–51)

## 2017-02-06 LAB — BRAIN NATRIURETIC PEPTIDE: B Natriuretic Peptide: 77.7 pg/mL (ref 0.0–100.0)

## 2017-02-06 MED ORDER — NITROGLYCERIN 0.4 MG SL SUBL
0.4000 mg | SUBLINGUAL_TABLET | SUBLINGUAL | Status: AC | PRN
  Administered 2017-02-06 (×3): 0.4 mg via SUBLINGUAL
  Filled 2017-02-06: qty 1

## 2017-02-06 MED ORDER — OXYCODONE-ACETAMINOPHEN 5-325 MG PO TABS: 1.0000 | ORAL_TABLET | ORAL | 0 refills | Status: DC | PRN

## 2017-02-06 MED ORDER — OXYCODONE-ACETAMINOPHEN 5-325 MG PO TABS
1.0000 | ORAL_TABLET | Freq: Once | ORAL | Status: AC
  Administered 2017-02-06: 1 via ORAL
  Filled 2017-02-06: qty 1

## 2017-02-06 MED ORDER — ONDANSETRON HCL 4 MG PO TABS: 4.0000 mg | ORAL_TABLET | Freq: Three times a day (TID) | ORAL | 0 refills | Status: DC | PRN

## 2017-02-06 NOTE — Consult Note (Signed)
Consult Note    Patient ID: Russell Fuentes MRN: [id number], DOB/AGE: 1951-01-29   Admit date: 02/06/2017   Primary Physician: [name], MD Primary Cardiologist: [name]  Seen at the request of Dr. Sherry Ruffing for the evaluation of chest pain.   Patient Profile    66 yo male with PMH of NICM s/p ICD, Chronic combined HF, DM, and HTN who presented with chest pain.   Past Medical History   Past Medical History:  Diagnosis Date  . Chronic combined systolic and diastolic heart failure ([place])    . Diabetes mellitus with neuropathy ([place])    . Nonischemic cardiomyopathy ([place])     a. s/p MDT CRTD  . Orthostatic lightheadedness      Past Surgical History:  Procedure Laterality Date  . CARDIAC CATHETERIZATION  12/31/1998   Bi-plane cine left ventriculography revealed low-normal left ventricular function with an ejection fraction in the 45% to 50% range.  . CORONARY ARTERY BYPASS GRAFT    . DOPPLER ECHOCARDIOGRAPHY  04/28/2011   LVEF 41% by simpson's borderline concentric LVH, global hypokinesis with regional variation. Stage 1 diastolic dysfunction, normal LV filling pressure. Normal LA size. Right heart pacing wires noted. Trace MR, TR. Normal RVSP  . NM MYOCAR PERF EJECTION FRACTION  04/03/2009, 11/18/2005   The post stress myocardial perfusion images show a normal pattern of perfusion in all regions. The post-stress ejection fraction is 50%. No significant wall motion abnormalities noted. This is a low risk scan.     Allergies  Allergies  Allergen Reactions  . Codeine Rash    History of Present Illness    Russell Fuentes is a 66 yo male with PMH of NICM s/p ICD, Chronic combined HF, DM, and HTN. He is followed by Dr. [name] in the outpatient setting. Last cath 9/15 no normal coronaries with EF of 35-40%. Had CRTD implanted in 2006 and under went generator change in 2011 and 2017. He was last seen in the office on 5/18 and reported doing well. Last device check in 02/03/17 showed  stable device function with one episode of NSVT.   Presented to the ED with 2 days of ongoing chest discomfort in the left side of his chest. Episodes are present with rest and not aggravated by activity. Also with some radiation into the left arm. Also been having generalized abd pain, and seems to be worse after eating. This morning pain with more intense prompting him to come to the ED. In the ED he had a short episode of NSVT on telemetry, and chest pain there afterwards. Labs showed stable electrolytes, Trop neg x1, Hgb 15.5, lipase 275. CXR was negative. He was given 2SL nitro with mild improvement of chest discomfort.   Home Medications    Prior to Admission medications   Medication Sig Start Date End Date Taking? Authorizing Provider  aspirin 81 MG tablet Take 81 mg by mouth daily.    [provider]  carvedilol (COREG) 25 MG tablet TAKE 1 TABLET(25 MG) BY MOUTH TWICE DAILY 10/22/16   [name], MD  glimepiride (AMARYL) 4 MG tablet Take 4 mg by mouth daily.     [provider]  LOVASTATIN PO Take 20 mg by mouth daily.    [provider]  spironolactone (ALDACTONE) 25 MG tablet TAKE 1/2 TABLET(12.5 MG) BY MOUTH DAILY 09/14/16   [name], MD    Family History    Family History  Problem Relation Age of Onset  . Asthma Sister   .  Asthma Brother   . Heart attack Father   . Heart attack Sister   . Cancer Mother   . Heart attack Mother   . Cancer Sister   . Hypertension Brother     Social History    Social History   Socioeconomic History  . Marital status: Married    Spouse name: Not on file  . Number of children: 3  . Years of education: 39  . Highest education level: Not on file  Social Needs  . Financial resource strain: Not on file  . Food insecurity - worry: Not on file  . Food insecurity - inability: Not on file  . Transportation needs - medical: Not on file  . Transportation needs - non-medical: Not on file  Occupational  History  . Occupation: Physiological scientist  Tobacco Use  . Smoking status: Never Smoker  . Smokeless tobacco: Never Used  Substance and Sexual Activity  . Alcohol use: No    Alcohol/week: 0.0 oz  . Drug use: Not on file  . Sexual activity: Not on file  Other Topics Concern  . Not on file  Social History Narrative  . Not on file     Review of Systems    General:  No chills, fever, night sweats or weight changes.  Cardiovascular:  No chest pain, dyspnea on exertion, edema, orthopnea, palpitations, paroxysmal nocturnal dyspnea. Dermatological: No rash, lesions/masses Respiratory: No cough, dyspnea Urologic: No hematuria, dysuria Abdominal:   No nausea, vomiting, diarrhea, bright red blood per rectum, melena, or hematemesis Neurologic:  No visual changes, wkns, changes in mental status. All other systems reviewed and are otherwise negative except as noted above.  Physical Exam    Blood pressure 131/83, pulse 82, temperature 98.7 F (37.1 C), temperature source Oral, resp. rate 18, SpO2 96 %.  General: Pleasant, NAD Psych: Normal affect. Neuro: Alert and oriented X 3. Moves all extremities spontaneously. HEENT: Normal  Neck: Supple without bruits or JVD. Lungs:  Resp regular and unlabored, CTA. Heart: RRR no s3, s4, or murmurs. Abdomen: Soft, non-tender, non-distended, BS + x 4.  Extremities: No clubbing, cyanosis or edema. DP/PT/Radials 2+ and equal bilaterally.  Labs    Troponin [facility] of Care Test) Recent Labs    02/06/17 0859  TROPIPOC 0.00   No results for input(s): CKTOTAL, CKMB, TROPONINI in the last 72 hours. Lab Results  Component Value Date   WBC 8.7 02/06/2017   HGB 15.5 02/06/2017   HCT 44.4 02/06/2017   MCV 84.9 02/06/2017   PLT 161 02/06/2017   No results for input(s): NA, K, CL, CO2, BUN, CREATININE, CALCIUM, PROT, BILITOT, ALKPHOS, ALT, AST, GLUCOSE in the last 168 hours.  Invalid input(s): LABALBU No results found for: CHOL, HDL, LDLCALC, TRIG No  results found for: [facility]   Radiology Studies    No results found.  ECG & Cardiac Imaging    EKG: V paced  Echo: 5/18  Study Conclusions  - Left ventricle: The cavity size was normal. Wall thickness was   normal. Systolic function was mildly to moderately reduced. The   estimated ejection fraction was in the range of 40% to 45%.   Doppler parameters are consistent with abnormal left ventricular   relaxation (grade 1 diastolic dysfunction).   Assessment & Plan    66 yo male with PMH of NICM s/p ICD, Chronic combined HF, DM, and HTN who presented with chest pain.   1. Chest pain: Reports ongoing chest discomfort for the past 2 days  that started in his abd and radiated into his chest. Cath 2015 with normal coronaries, and EF reported at 30%. Also with generalized abd pain, tender with palpation. Lipase is elevated. Suspect pain is not cardiac in nature.  --Trop negx1.   2. NSVT: noted to have brief episode of NSVT on telemetry with some associated chest discomfort.  -- device to be interrogated in the ED  3. Chronic combined HF: appears volume stable on exam.  4. Abd pain: Tender in the RUQ with palpation with elevated Lipase. Further work up primary  Signed, Caryl Manas, NP-C Pager [phone] 02/06/2017, 9:33 AM As above, patient seen and examined. Briefly he is a 66 year old male with past medical history of nonischemic cardiomyopathy, prior ICD, chronic combined systolic/diastolic congestive heart failure, diabetes mellitus, hypertension for evaluation of abdominal/chest pain. Previous catheterization September 2015 showed normal coronary arteries. Patient states that for the past 3 days he has had intermittent pain in the left upper quadrant radiating to the lower abdomen and also to the left upper chest and back. The pain is not pleuritic, positional, exertional or related to food. It typically lasts 15-20 minutes but resolves. However it has been recurrent. No  nausea/vomiting, dyspnea or diaphoresis. He denies fevers, chills, melena or hematochezia. He presented to the emergency room for evaluation and also had nonsustained ventricular tachycardia on telemetry. Cardiology asked to evaluate. Physical exam is significant for mild tenderness to palpation left upper quadrant and left rib area. Laboratories significant for lipase 275 with normal liver functions. Troponin and BNP normal. White blood cell count 8.7 with hemoglobin 15.5. Electrocardiogram shows sinus rhythm with ventricular pacing.  1 chest pain-patient symptoms are predominantly abdominal/chest pain. They do not appear to be consistent with cardiac etiology and his troponin is normal. Note he is not tender in the right upper quadrant. The only lab abnormality is elevated lipase. Question mild pancreatitis. I do not think we need to pursue further cardiac evaluation at this point. I have discussed this with the emergency room physician and they will evaluate his abdominal pain further.  2 nonischemic cardiomyopathy-continue beta blocker. ARB discontinued recently because of low blood pressure. Follow-up with Dr. [name] for further evaluation.  3 nonsustained ventricular tachycardia-continue beta blocker. ICD in place. Note ICD was interrogated and by report functioning appropriately. Please call with questions. Kirk Ruths, MD

## 2017-02-06 NOTE — Discharge Instructions (Signed)
Your workup today showed evidence of acute pancreatitis.  Your enzymes for your heart were negative.  The cardiology team felt you were safe for discharge home.  Please stay hydrated and use the medications to help with your discomfort.  Please follow-up with your primary care physician for reassessment in several days.  If any symptoms change or worsen, please return to the nearest emergency department.

## 2017-02-06 NOTE — ED Triage Notes (Signed)
Pt to ER for evaluation of central lower chest pain radiating into lower abdomen as well as back and shoulders. States has been going on for 2 days. States dizziness with the pain as well as nausea. Pt is alert and oriented. VSS. Has pacemaker in place for cardiomyopathy.

## 2017-02-06 NOTE — ED Provider Notes (Signed)
[facility] EMERGENCY DEPARTMENT Provider Note   CSN: [id number] Arrival date & time: 02/06/17  5784     History   Chief Complaint Chief Complaint  Patient presents with  . Chest Pain    HPI Russell Fuentes is a 66 y.o. male.  The history is provided by the patient, the spouse and medical records. No language interpreter was used.  Chest Pain   This is a new problem. The current episode started more than 2 days ago. The problem occurs daily. The problem has not changed since onset.The pain is associated with exertion. The pain is present in the substernal region. The pain is at a severity of 7/10. The pain is moderate. The quality of the pain is described as exertional and dull. The pain radiates to the left shoulder. Duration of episode(s) is 30 minutes. The symptoms are aggravated by exertion and certain positions. Associated symptoms include abdominal pain (upper), diaphoresis, malaise/fatigue, nausea and palpitations. Pertinent negatives include no back pain, no cough, no dizziness, no fever, no headaches, no leg pain, no lower extremity edema, no near-syncope, no numbness, no shortness of breath, no vomiting and no weakness. He has tried rest for the symptoms. The treatment provided mild relief.  His past medical history is significant for CHF and pacemaker.  His family medical history is significant for early MI.    Past Medical History:  Diagnosis Date  . Chronic combined systolic and diastolic heart failure ([place])    . Diabetes mellitus with neuropathy ([place])    . Nonischemic cardiomyopathy ([place])     a. s/p MDT CRTD  . Orthostatic lightheadedness      Patient Active Problem List   Diagnosis Date Noted  . Subjective tinnitus 05/29/2015  . Cerumen impaction 05/29/2015  . ASNHL (asymmetrical sensorineural hearing loss) 05/29/2015  . Angina pectoris with normal coronary arteriogram ([place]) 12/11/2013  . Angina pectoris ([place]) 12/11/2013  . Chronic combined  systolic and diastolic heart failure ([place]) 01/08/2013  . 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 Orthostatic lightheadedness 01/08/2013  . Diabetes mellitus with neuropathy ([place]) 01/08/2013  . Hypotension, postural 01/08/2013  . Diabetes mellitus ([place]) 01/08/2013  . Cardiac defibrillator in place 01/08/2013  . Nonischemic cardiomyopathy ([place]) 07/16/2009  . Cardiomyopathy ([place]) 07/16/2009  . HYPERLIPIDEMIA 10/25/2007    Past Surgical History:  Procedure Laterality Date  . CARDIAC CATHETERIZATION  12/31/1998   Bi-plane cine left ventriculography revealed low-normal left ventricular function with an ejection fraction in the 45% to 50% range.  . CORONARY ARTERY BYPASS GRAFT    . DOPPLER ECHOCARDIOGRAPHY  04/28/2011   LVEF 41% by simpson's borderline concentric LVH, global hypokinesis with regional variation. Stage 1 diastolic dysfunction, normal LV filling pressure. Normal LA size. Right heart pacing wires noted. Trace MR, TR. Normal RVSP  . NM MYOCAR PERF EJECTION FRACTION  04/03/2009, 11/18/2005   The post stress myocardial perfusion images show a normal pattern of perfusion in all regions. The post-stress ejection fraction is 50%. No significant wall motion abnormalities noted. This is a low risk scan.       Home Medications    Prior to Admission medications   Medication Sig Start Date End Date Taking? Authorizing Provider  aspirin 81 MG tablet Take 81 mg by mouth daily.    [provider]  carvedilol (COREG) 25 MG tablet TAKE 1 TABLET(25 MG) BY MOUTH TWICE DAILY 10/22/16   [name], MD  glimepiride (AMARYL) 4 MG tablet Take 4 mg by  mouth daily.     [provider]  LOVASTATIN PO Take 20 mg by mouth daily.    [provider]  spironolactone (ALDACTONE) 25 MG tablet TAKE 1/2 TABLET(12.5 MG) BY MOUTH DAILY 09/14/16   [name], MD    Family History Family History  Problem Relation Age of Onset  .  Asthma Sister   . Asthma Brother   . Heart attack Father   . Heart attack Sister   . Cancer Mother   . Heart attack Mother   . Cancer Sister   . Hypertension Brother     Social History Social History   Tobacco Use  . Smoking status: Never Smoker  . Smokeless tobacco: Never Used  Substance Use Topics  . Alcohol use: No    Alcohol/week: 0.0 oz  . Drug use: Not on file     Allergies   Codeine   Review of Systems Review of Systems  Constitutional: Positive for diaphoresis, fatigue and malaise/fatigue. Negative for chills and fever.  HENT: Negative for congestion.   Eyes: Negative for visual disturbance.  Respiratory: Negative for cough, choking, chest tightness, shortness of breath, wheezing and stridor.   Cardiovascular: Positive for chest pain and palpitations. Negative for leg swelling and near-syncope.  Gastrointestinal: Positive for abdominal pain (upper) and nausea. Negative for constipation, diarrhea and vomiting.  Genitourinary: Negative for dysuria.  Musculoskeletal: Negative for back pain, neck pain and neck stiffness.  Neurological: Negative for dizziness, weakness, light-headedness, numbness and headaches.  Psychiatric/Behavioral: Negative for agitation.  All other systems reviewed and are negative.    Physical Exam Updated Vital Signs BP 131/83 (BP Location: Right Arm)   Pulse 82   Temp 98.7 F (37.1 C) (Oral)   Resp 18   SpO2 96%   Physical Exam  Constitutional: He appears well-developed and well-nourished.  Non-toxic appearance. He does not appear ill. No distress.  HENT:  Head: Normocephalic and atraumatic.  Mouth/Throat: Oropharynx is clear and moist. No oropharyngeal exudate.  Eyes: Conjunctivae are normal. Pupils are equal, round, and reactive to light.  Neck: Normal range of motion. Neck supple. No JVD present.  Cardiovascular: Normal rate, intact distal pulses and normal pulses.  No murmur heard.  No systolic murmur is  present. Pulmonary/Chest: Effort normal and breath sounds normal. No respiratory distress. He has no decreased breath sounds. He has no wheezes. He has no rhonchi. He has no rales. He exhibits tenderness.  Abdominal: Soft. There is tenderness (mild epigastric tenderness).  Musculoskeletal: He exhibits no edema or tenderness.       Right lower leg: He exhibits no edema.       Left lower leg: He exhibits no edema.  Neurological: He is alert. No sensory deficit. He exhibits normal muscle tone.  Skin: Skin is warm. Capillary refill takes less than 2 seconds. He is diaphoretic. No cyanosis or erythema. No pallor.  Psychiatric: He has a normal mood and affect.  Nursing note and vitals reviewed.    ED Treatments / Results  Labs (all labs ordered are listed, but only abnormal results are displayed) Labs Reviewed  BASIC METABOLIC PANEL - Abnormal; Notable for the following components:      Result Value   Sodium 134 (*)    Glucose, Bld 224 (*)    Calcium 8.4 (*)    All other components within normal limits  HEPATIC FUNCTION PANEL - Abnormal; Notable for the following components:   Total Protein 6.2 (*)    Albumin 3.4 (*)  AST 14 (*)    All other components within normal limits  LIPASE, BLOOD - Abnormal; Notable for the following components:   Lipase 275 (*)    All other components within normal limits  CBC  BRAIN NATRIURETIC PEPTIDE  PROTIME-INR  MAGNESIUM  I-STAT TROPONIN, ED  I-STAT TROPONIN, ED    EKG  EKG Interpretation  Date/Time:  Sunday February 06 2017 08:27:24 EST Ventricular Rate:  76 PR Interval:    QRS Duration: 131 QT Interval:  435 QTC Calculation: 490 R Axis:   -134 Text Interpretation:  PAced rhythm Anterolateral infarct, age indeterminate No STEMI Confirmed by Antony Blackbird [phone]) on 02/06/2017 8:32:58 AM Also confirmed by Antony Blackbird [phone]), editor Philomena Doheny [phone])  on 02/06/2017 9:57:34 AM       Radiology Dg Chest 2 View  Result Date:  02/06/2017 CLINICAL DATA:  66 year old male with chest pain EXAM: CHEST  2 VIEW COMPARISON:  07/20/2016 FINDINGS: A left-sided triple lead pacer appears in stable position. Cardiomediastinal silhouette is within normal limits. No focal parenchymal opacities, sizeable effusions or pneumothorax. Stable compression deformity of a lower thoracic vertebrae again noted. No other acute osseous abnormalities. IMPRESSION: No active cardiopulmonary disease. Electronically Signed   By: Kristopher Oppenheim M.D.   On: 02/06/2017 09:40    Procedures Procedures (including critical care time)  Medications Ordered in ED Medications  nitroGLYCERIN (NITROSTAT) SL tablet 0.4 mg (0.4 mg Sublingual Given 02/06/17 0955)  oxyCODONE-acetaminophen (PERCOCET/ROXICET) 5-325 MG per tablet 1 tablet (1 tablet Oral Given 02/06/17 1307)     Initial Impression / Assessment and Plan / ED Course  I have reviewed the triage vital signs and the nursing notes.  Pertinent labs & imaging results that were available during my care of the patient were reviewed by me and considered in my medical decision making (see chart for details).     RAYNELL SCOTT is a 66 y.o. male with a past medical history significant for cardiomyopathy status post pacemaker  and diabetes who presents with chest pain.  Patient reports over the last few days he has had intermittent chest discomfort.  He describes as a pressure in his epigastrium and left chest.  He reports it tingles in his left shoulder.  He reports that it is worsened after walking through his [place] and he feels it is worsened with exertion.  He denies pleuritic pain.  He reports no significant shortness of breath but does report some nausea.  He reports diaphoresis with it.  He reported that rest in certain positions could improve it.  He reports feeling associated palpitations.  Patient denies recent fevers, chills, trauma.  He denies any urinary symptoms or GI symptoms.  While patient was  being triaged, patient found to have a run of ventricular tachycardia on the monitor.  Patient's initial EKG showed evidence of a right bundle branch block similar to prior.  No STEMI.  It was a paced rhythm.  Patient's pacemaker will be interrogated.   On my exam, patient had minimal tenderness to his lower chest.  Minimal epigastric tenderness.  No CVA tenderness.  Lungs clear.  Legs were not significant edematous.  Pulses symmetric in all extremities.  Due to patient's discomfort, anticipate patient will require admission for the runs of ventricular tachycardia and his chest pain.  Patient will have screening laboratory testing to look for etiology of his symptoms.  1:44 PM Patient's initial and second troponin were both negative.  BNP not elevated.  Cardiology came to see the  patient and did not feel it was cardiac in nature.  Patient was however found to have a positive lipase.  The setting of the epigastric and left lower chest/left upper quadrant abdominal pain, suspect mild pancreatitis.  Given his improvement in symptoms after medications, do not feel he needs imaging or admission at this time.  Cardiology felt that his runs of V. tach are chronic and did not feel he needed any further management in that regard.  Chest x-ray reassuring.  Patient will be given prescription for pain medicine and nausea medicine.  Patient advised on bowel rest and staying hydrated.  Patient will follow up with PCP.  Patient voiced understand.  Patient had no other questions or concerns and was discharged in good condition.   Final Clinical Impressions(s) / ED Diagnoses   Final diagnoses:  Precordial pain  Left upper quadrant pain  Acute pancreatitis, unspecified complication status, unspecified pancreatitis type    New Prescriptions This SmartLink is deprecated. Use AVSMEDLIST instead to display the medication list for a patient.   Clinical Impression: 1. Precordial pain   2. Left upper quadrant  pain   3. Acute pancreatitis, unspecified complication status, unspecified pancreatitis type     Disposition: Discharge  Condition: Good  I have discussed the results, Dx and Tx plan with the pt(& family if present). He/she/they expressed understanding and agree(s) with the plan. Discharge instructions discussed at great length. Strict return precautions discussed and pt &/or family have verbalized understanding of the instructions. No further questions at time of discharge.    This SmartLink is deprecated. Use AVSMEDLIST instead to display the medication list for a patient.  Follow Up: [name], MD [address] [place] [address] [phone]  Schedule an appointment as soon as possible for a visit    [facility] [address] [id number] [place] [phone]  If symptoms worsen     Tegeler, Gwenyth Allegra, MD 02/06/17 Joen Laura

## 2017-02-06 NOTE — ED Notes (Signed)
7 beat run of Vtach noted on telemetry, captured and recorded, given to MD Tegeler. Pt reports pain during the episode. Vitals remain stable. Pt remains a/o x4.

## 2017-02-06 NOTE — ED Notes (Signed)
Patient pacemaker interrogated successfully.

## 2017-02-09 ENCOUNTER — Encounter: Payer: Self-pay | Admitting: Cardiology

## 2017-02-14 DIAGNOSIS — Z7984 Long term (current) use of oral hypoglycemic drugs: Secondary | ICD-10-CM | POA: Diagnosis not present

## 2017-02-14 DIAGNOSIS — K859 Acute pancreatitis without necrosis or infection, unspecified: Secondary | ICD-10-CM | POA: Diagnosis not present

## 2017-02-14 DIAGNOSIS — E1122 Type 2 diabetes mellitus with diabetic chronic kidney disease: Secondary | ICD-10-CM | POA: Diagnosis not present

## 2017-02-21 DIAGNOSIS — M25562 Pain in left knee: Secondary | ICD-10-CM | POA: Diagnosis not present

## 2017-02-21 DIAGNOSIS — Z23 Encounter for immunization: Secondary | ICD-10-CM | POA: Diagnosis not present

## 2017-04-25 DIAGNOSIS — D126 Benign neoplasm of colon, unspecified: Secondary | ICD-10-CM | POA: Diagnosis not present

## 2017-04-25 DIAGNOSIS — Z86010 Personal history of colonic polyps: Secondary | ICD-10-CM | POA: Diagnosis not present

## 2017-04-25 DIAGNOSIS — K5730 Diverticulosis of large intestine without perforation or abscess without bleeding: Secondary | ICD-10-CM | POA: Diagnosis not present

## 2017-04-28 DIAGNOSIS — D126 Benign neoplasm of colon, unspecified: Secondary | ICD-10-CM | POA: Diagnosis not present

## 2017-05-03 ENCOUNTER — Ambulatory Visit (INDEPENDENT_AMBULATORY_CARE_PROVIDER_SITE_OTHER): Payer: Medicare Other | Admitting: *Deleted

## 2017-05-03 DIAGNOSIS — I428 Other cardiomyopathies: Secondary | ICD-10-CM

## 2017-05-03 DIAGNOSIS — I5022 Chronic systolic (congestive) heart failure: Secondary | ICD-10-CM

## 2017-05-03 NOTE — Progress Notes (Signed)
Remote ICD transmission.   

## 2017-05-05 ENCOUNTER — Encounter: Payer: Self-pay | Admitting: Cardiology

## 2017-05-10 DIAGNOSIS — J309 Allergic rhinitis, unspecified: Secondary | ICD-10-CM | POA: Diagnosis not present

## 2017-05-10 DIAGNOSIS — N183 Chronic kidney disease, stage 3 (moderate): Secondary | ICD-10-CM | POA: Diagnosis not present

## 2017-05-10 DIAGNOSIS — Z0000 Encounter for general adult medical examination without abnormal findings: Secondary | ICD-10-CM | POA: Diagnosis not present

## 2017-05-10 DIAGNOSIS — Z125 Encounter for screening for malignant neoplasm of prostate: Secondary | ICD-10-CM | POA: Diagnosis not present

## 2017-05-10 DIAGNOSIS — E1122 Type 2 diabetes mellitus with diabetic chronic kidney disease: Secondary | ICD-10-CM | POA: Diagnosis not present

## 2017-05-10 DIAGNOSIS — E669 Obesity, unspecified: Secondary | ICD-10-CM | POA: Diagnosis not present

## 2017-05-10 DIAGNOSIS — E782 Mixed hyperlipidemia: Secondary | ICD-10-CM | POA: Diagnosis not present

## 2017-05-10 DIAGNOSIS — I130 Hypertensive heart and chronic kidney disease with heart failure and stage 1 through stage 4 chronic kidney disease, or unspecified chronic kidney disease: Secondary | ICD-10-CM | POA: Diagnosis not present

## 2017-05-10 DIAGNOSIS — Z1159 Encounter for screening for other viral diseases: Secondary | ICD-10-CM | POA: Diagnosis not present

## 2017-05-10 DIAGNOSIS — I429 Cardiomyopathy, unspecified: Secondary | ICD-10-CM | POA: Diagnosis not present

## 2017-05-10 DIAGNOSIS — M545 Low back pain: Secondary | ICD-10-CM | POA: Diagnosis not present

## 2017-05-10 DIAGNOSIS — I5022 Chronic systolic (congestive) heart failure: Secondary | ICD-10-CM | POA: Diagnosis not present

## 2017-05-18 LAB — CUP PACEART REMOTE DEVICE CHECK
Brady Statistic AP VS Percent: 0.06 %
Brady Statistic AS VP Percent: 56.46 %
Brady Statistic AS VS Percent: 0.22 %
Brady Statistic RA Percent Paced: 43.2 %
Brady Statistic RV Percent Paced: 99.38 %
Date Time Interrogation Session: 20190129072603
HIGH POWER IMPEDANCE MEASURED VALUE: 67 Ohm
HighPow Impedance: 52 Ohm
Implantable Lead Implant Date: 20110420
Implantable Lead Location: 753858
Implantable Lead Model: 5076
Implantable Pulse Generator Implant Date: 20160613
Lead Channel Impedance Value: 361 Ohm
Lead Channel Impedance Value: 4047 Ohm
Lead Channel Impedance Value: 532 Ohm
Lead Channel Impedance Value: 532 Ohm
Lead Channel Pacing Threshold Amplitude: 1.25 V
Lead Channel Pacing Threshold Pulse Width: 0.4 ms
Lead Channel Pacing Threshold Pulse Width: 0.4 ms
Lead Channel Pacing Threshold Pulse Width: 0.6 ms
Lead Channel Sensing Intrinsic Amplitude: 16.75 mV
Lead Channel Sensing Intrinsic Amplitude: 16.75 mV
Lead Channel Setting Pacing Amplitude: 2 V
Lead Channel Setting Pacing Amplitude: 2.75 V
Lead Channel Setting Pacing Pulse Width: 0.4 ms
Lead Channel Setting Pacing Pulse Width: 0.6 ms
MDC IDC LEAD IMPLANT DT: 20060323
MDC IDC LEAD IMPLANT DT: 20060323
MDC IDC LEAD LOCATION: 753859
MDC IDC LEAD LOCATION: 753860
MDC IDC MSMT BATTERY REMAINING LONGEVITY: 62 mo
MDC IDC MSMT BATTERY VOLTAGE: 2.96 V
MDC IDC MSMT LEADCHNL LV IMPEDANCE VALUE: 4047 Ohm
MDC IDC MSMT LEADCHNL RA IMPEDANCE VALUE: 513 Ohm
MDC IDC MSMT LEADCHNL RA PACING THRESHOLD AMPLITUDE: 0.625 V
MDC IDC MSMT LEADCHNL RA SENSING INTR AMPL: 2.625 mV
MDC IDC MSMT LEADCHNL RA SENSING INTR AMPL: 2.625 mV
MDC IDC MSMT LEADCHNL RV PACING THRESHOLD AMPLITUDE: 1 V
MDC IDC SET LEADCHNL RA PACING AMPLITUDE: 1.5 V
MDC IDC SET LEADCHNL RV SENSING SENSITIVITY: 0.6 mV
MDC IDC STAT BRADY AP VP PERCENT: 43.26 %

## 2017-06-16 DIAGNOSIS — H6123 Impacted cerumen, bilateral: Secondary | ICD-10-CM | POA: Diagnosis not present

## 2017-07-04 DIAGNOSIS — D485 Neoplasm of uncertain behavior of skin: Secondary | ICD-10-CM | POA: Diagnosis not present

## 2017-07-04 DIAGNOSIS — C44622 Squamous cell carcinoma of skin of right upper limb, including shoulder: Secondary | ICD-10-CM | POA: Diagnosis not present

## 2017-07-04 DIAGNOSIS — D1801 Hemangioma of skin and subcutaneous tissue: Secondary | ICD-10-CM | POA: Diagnosis not present

## 2017-07-04 DIAGNOSIS — L570 Actinic keratosis: Secondary | ICD-10-CM | POA: Diagnosis not present

## 2017-07-04 DIAGNOSIS — C44329 Squamous cell carcinoma of skin of other parts of face: Secondary | ICD-10-CM | POA: Diagnosis not present

## 2017-07-04 DIAGNOSIS — Z85828 Personal history of other malignant neoplasm of skin: Secondary | ICD-10-CM | POA: Diagnosis not present

## 2017-07-04 DIAGNOSIS — L812 Freckles: Secondary | ICD-10-CM | POA: Diagnosis not present

## 2017-07-11 ENCOUNTER — Other Ambulatory Visit: Payer: Self-pay | Admitting: Internal Medicine

## 2017-07-21 DIAGNOSIS — Z85828 Personal history of other malignant neoplasm of skin: Secondary | ICD-10-CM | POA: Diagnosis not present

## 2017-07-21 DIAGNOSIS — C44329 Squamous cell carcinoma of skin of other parts of face: Secondary | ICD-10-CM | POA: Diagnosis not present

## 2017-07-28 ENCOUNTER — Other Ambulatory Visit: Payer: Self-pay

## 2017-07-29 ENCOUNTER — Ambulatory Visit (INDEPENDENT_AMBULATORY_CARE_PROVIDER_SITE_OTHER): Payer: Medicare Other | Admitting: *Deleted

## 2017-07-29 DIAGNOSIS — I5022 Chronic systolic (congestive) heart failure: Secondary | ICD-10-CM | POA: Diagnosis not present

## 2017-07-29 DIAGNOSIS — I428 Other cardiomyopathies: Secondary | ICD-10-CM

## 2017-07-29 NOTE — Progress Notes (Signed)
Remote ICD transmission.   

## 2017-08-01 ENCOUNTER — Encounter: Payer: Self-pay | Admitting: Cardiology

## 2017-08-15 ENCOUNTER — Encounter: Payer: Self-pay | Admitting: Internal Medicine

## 2017-08-15 ENCOUNTER — Telehealth: Payer: Self-pay | Admitting: *Deleted

## 2017-08-15 ENCOUNTER — Ambulatory Visit (INDEPENDENT_AMBULATORY_CARE_PROVIDER_SITE_OTHER): Payer: Medicare Other | Admitting: Internal Medicine

## 2017-08-15 VITALS — BP 116/80 | HR 70 | Ht 70.0 in | Wt 243.0 lb

## 2017-08-15 DIAGNOSIS — Z95810 Presence of automatic (implantable) cardiac defibrillator: Secondary | ICD-10-CM

## 2017-08-15 DIAGNOSIS — I428 Other cardiomyopathies: Secondary | ICD-10-CM | POA: Diagnosis not present

## 2017-08-15 DIAGNOSIS — I5022 Chronic systolic (congestive) heart failure: Secondary | ICD-10-CM | POA: Diagnosis not present

## 2017-08-15 LAB — CUP PACEART INCLINIC DEVICE CHECK
Brady Statistic AP VP Percent: 45.29 %
Brady Statistic AP VS Percent: 0.06 %
Brady Statistic AS VS Percent: 0.19 %
Brady Statistic RA Percent Paced: 45.18 %
Brady Statistic RV Percent Paced: 99.26 %
HighPow Impedance: 54 Ohm
HighPow Impedance: 72 Ohm
Implantable Lead Implant Date: 20060323
Implantable Lead Implant Date: 20060323
Implantable Lead Location: 753859
Implantable Lead Location: 753860
Implantable Lead Model: 4193
Implantable Lead Model: 5076
Implantable Pulse Generator Implant Date: 20160613
Lead Channel Impedance Value: 4047 Ohm
Lead Channel Impedance Value: 4047 Ohm
Lead Channel Impedance Value: 513 Ohm
Lead Channel Impedance Value: 532 Ohm
Lead Channel Pacing Threshold Amplitude: 0.5 V
Lead Channel Pacing Threshold Amplitude: 1.5 V
Lead Channel Sensing Intrinsic Amplitude: 3.75 mV
Lead Channel Setting Pacing Amplitude: 1.5 V
Lead Channel Setting Pacing Amplitude: 2 V
Lead Channel Setting Pacing Pulse Width: 0.6 ms
MDC IDC LEAD IMPLANT DT: 20110420
MDC IDC LEAD LOCATION: 753858
MDC IDC MSMT BATTERY REMAINING LONGEVITY: 55 mo
MDC IDC MSMT BATTERY VOLTAGE: 2.96 V
MDC IDC MSMT LEADCHNL LV IMPEDANCE VALUE: 475 Ohm
MDC IDC MSMT LEADCHNL LV PACING THRESHOLD PULSEWIDTH: 0.6 ms
MDC IDC MSMT LEADCHNL RA PACING THRESHOLD PULSEWIDTH: 0.4 ms
MDC IDC MSMT LEADCHNL RV IMPEDANCE VALUE: 342 Ohm
MDC IDC MSMT LEADCHNL RV PACING THRESHOLD AMPLITUDE: 1.25 V
MDC IDC MSMT LEADCHNL RV PACING THRESHOLD PULSEWIDTH: 0.4 ms
MDC IDC MSMT LEADCHNL RV SENSING INTR AMPL: 24 mV
MDC IDC SESS DTM: 20190513165136
MDC IDC SET LEADCHNL RV PACING AMPLITUDE: 2.5 V
MDC IDC SET LEADCHNL RV PACING PULSEWIDTH: 0.4 ms
MDC IDC SET LEADCHNL RV SENSING SENSITIVITY: 0.6 mV
MDC IDC STAT BRADY AS VP PERCENT: 54.46 %

## 2017-08-15 NOTE — Patient Instructions (Signed)
Medication Instructions:  Your physician recommends that you continue on your current medications as directed. Please refer to the Current Medication list given to you today.  Labwork: You will have labs drawn today: BMP and CBC  Testing/Procedures: Your physician has requested that you have an echocardiogram. Echocardiography is a painless test that uses sound waves to create images of your heart. It provides your doctor with information about the size and shape of your heart and how well your heart's chambers and valves are working. This procedure takes approximately one hour. There are no restrictions for this procedure.   Follow-Up: Your physician wants you to follow-up in: One year with Dr [name]. You will receive a reminder letter in the mail two months in advance. If you don't receive a letter, please call our office to schedule the follow-up appointment.  Remote monitoring is used to monitor your Pacemaker from home. This monitoring reduces the number of office visits required to check your device to one time per year. It allows [place] to keep an eye on the functioning of your device to ensure it is working properly. You are scheduled for a device check from home on 10/31/2017. You may send your transmission at any time that day. If you have a wireless device, the transmission will be sent automatically. After your physician reviews your transmission, you will receive a postcard with your next transmission date.    Any Other Special Instructions Will Be Listed Below (If Applicable).     If you need a refill on your cardiac medications before your next appointment, please call your pharmacy.

## 2017-08-15 NOTE — Progress Notes (Signed)
Patient Care Team: [name], MD as PCP - General (Family Medicine)   HPI  Russell Fuentes is a 67 y.o. male Seen in followup for an ICD to CRT implanted for nonischemic cardiomyopathy;  he underwent generator replacement 6/16  Ejection fraction by echo 11/14 was normal  He underwent catheterization 2015 because of ongoing problems with chest discomfort. This demonstrated no obstructive coronary disease  DATE TEST EF   11/14 Echo   55-65 %   9/15 LHC  No obstructive CAD  5/18 Echo   35 %   5/18 Echo  45  AV/VV optimization   He continues to complain of exertional and nonexertional chest discomfort.  He is able to work in the yard sometimes hours at a time.  He denies edema.  He does have some shortness of breath.  Past Medical History:  Diagnosis Date  . Chronic combined systolic and diastolic heart failure ([place])    . Diabetes mellitus with neuropathy ([place])    . Nonischemic cardiomyopathy ([place])     a. s/p MDT CRTD  . Orthostatic lightheadedness      Past Surgical History:  Procedure Laterality Date  . CARDIAC CATHETERIZATION  12/31/1998   Bi-plane cine left ventriculography revealed low-normal left ventricular function with an ejection fraction in the 45% to 50% range.  . CORONARY ARTERY BYPASS GRAFT    . DOPPLER ECHOCARDIOGRAPHY  04/28/2011   LVEF 41% by simpson's borderline concentric LVH, global hypokinesis with regional variation. Stage 1 diastolic dysfunction, normal LV filling pressure. Normal LA size. Right heart pacing wires noted. Trace MR, TR. Normal RVSP  . EP IMPLANTABLE DEVICE N/A 09/16/2014   Procedure: ICD Generator Changeout;  Surgeon: [name], MD;  Location: [facility];  Service: Cardiovascular;  Laterality: N/A;  . LEFT HEART CATHETERIZATION WITH CORONARY ANGIOGRAM N/A 12/11/2013   Procedure: LEFT HEART CATHETERIZATION WITH CORONARY ANGIOGRAM;  Surgeon: [name], MD;  Location: [facility] CATH LAB;  Service: Cardiovascular;   Laterality: N/A;  . NM MYOCAR PERF EJECTION FRACTION  04/03/2009, 11/18/2005   The post stress myocardial perfusion images show a normal pattern of perfusion in all regions. The post-stress ejection fraction is 50%. No significant wall motion abnormalities noted. This is a low risk scan.    Current Outpatient Medications  Medication Sig Dispense Refill  . carvedilol (COREG) 25 MG tablet TAKE 1 TABLET(25 MG) BY MOUTH TWICE DAILY 180 tablet 0  . LOVASTATIN PO Take 20 mg by mouth daily.    . ondansetron (ZOFRAN) 4 MG tablet Take 1 tablet (4 mg total) every 8 (eight) hours as needed by mouth for nausea or vomiting. 12 tablet 0  . spironolactone (ALDACTONE) 25 MG tablet TAKE 1/2 TABLET(12.5 MG) BY MOUTH DAILY 45 tablet 3   No current facility-administered medications for this visit.     Allergies  Allergen Reactions  . Codeine Rash    Review of Systems negative except from HPI and PMH  Physical Exam BP 116/80   Pulse 70   Ht 5\' 10"  (1.778 m)   Wt 243 lb (110.2 kg)   SpO2 98%   BMI 34.87 kg/m  Well developed and nourished in no acute distress HENT normal Neck supple with JVP-flat Clear Regular rate and rhythm, no murmurs or gallops Abd-soft with active BS No Clubbing cyanosis edema Skin-warm and dry A & Oriented  Grossly normal sensory and motor function y  ECG demonstrates P-synchronous/ AV  pacing The QRS is  negative in lead 1 and positive in lead V1  Assessment and  Plan  Nonischemic cardiomyopathy-   Chest pain  Congestive heart failure-chronic-systolic  Diabetes  Implantable defibrillator-Medtronic- CRT  Sleep disorder breathing  He has been able to keep his weight off.  Breathing is better.  Diabetes is better.  Blood pressures are also better.  We will reassess LV function having had re-programming a year ago.  Functional status is improved  Euvolemic continue current meds  We spent more than 50% of our >25 min visit in face to face counseling regarding  the above

## 2017-08-15 NOTE — Telephone Encounter (Signed)
Checkout called triage on this pt in regards to the pt complaining of feeling light-headed after being discharged from seeing Dr [name] in the office, minutes prior to the event.  Pt was going to check-out to schedule his follow-up appts, where he then voiced he felt dizzy and light-headed. Dr Olin Pia RN was made aware of this and she advised the check out rep to call triage for further assistance with this pt.  When approaching the pt noted he was in no obvious distress.  Took pts BP and it was 150/76 and HR- 76.  Pt states he felt light-headed when he stood up to quickly. Pt states that he is diabetic and when he checked his BS today it was 235 and he ate a bacon egg and cheese biscuit and had a half glass of pepsi.  Checked the pts BS and it was 76.  Offered the pt graham crackers and a full glass of water, in which he tolerated both appropriately.  Pt then stated he felt much better and ambulated out of the office with his wife with no complications.  Advised the pt as Dr [name] advised, he should go and eat lunch, monitor his BS, hydrate well, and rest.  Advised the pt that per Dr [name], if he has further dizziness, he should refer to the ER.  Pt verbalized understanding and agrees with this plan.

## 2017-08-16 LAB — CBC WITH DIFFERENTIAL/PLATELET
BASOS ABS: 0 10*3/uL (ref 0.0–0.2)
Basos: 1 %
EOS (ABSOLUTE): 0.2 10*3/uL (ref 0.0–0.4)
Eos: 3 %
Hematocrit: 43.7 % (ref 37.5–51.0)
Hemoglobin: 15.5 g/dL (ref 13.0–17.7)
IMMATURE GRANS (ABS): 0 10*3/uL (ref 0.0–0.1)
Immature Granulocytes: 0 %
LYMPHS ABS: 1.6 10*3/uL (ref 0.7–3.1)
LYMPHS: 28 %
MCH: 29.7 pg (ref 26.6–33.0)
MCHC: 35.5 g/dL (ref 31.5–35.7)
MCV: 84 fL (ref 79–97)
Monocytes Absolute: 0.6 10*3/uL (ref 0.1–0.9)
Monocytes: 10 %
NEUTROS ABS: 3.3 10*3/uL (ref 1.4–7.0)
Neutrophils: 58 %
PLATELETS: 180 10*3/uL (ref 150–379)
RBC: 5.22 x10E6/uL (ref 4.14–5.80)
RDW: 14.3 % (ref 12.3–15.4)
WBC: 5.7 10*3/uL (ref 3.4–10.8)

## 2017-08-16 LAB — BASIC METABOLIC PANEL
BUN/Creatinine Ratio: 11 (ref 10–24)
BUN: 13 mg/dL (ref 8–27)
CALCIUM: 9.4 mg/dL (ref 8.6–10.2)
CHLORIDE: 104 mmol/L (ref 96–106)
CO2: 23 mmol/L (ref 20–29)
Creatinine, Ser: 1.16 mg/dL (ref 0.76–1.27)
GFR calc non Af Amer: 64 mL/min/{1.73_m2} (ref >59)
GFR, EST AFRICAN AMERICAN: 74 mL/min/{1.73_m2} (ref >59)
GLUCOSE: 78 mg/dL (ref 65–99)
POTASSIUM: 4.7 mmol/L (ref 3.5–5.2)
Sodium: 142 mmol/L (ref 134–144)

## 2017-08-22 ENCOUNTER — Telehealth: Payer: Self-pay

## 2017-08-22 ENCOUNTER — Telehealth: Payer: Self-pay | Admitting: Internal Medicine

## 2017-08-22 NOTE — Telephone Encounter (Addendum)
New message:     Pt returning call for lab results and pt states to leave a detailed msg on voicemail.

## 2017-08-22 NOTE — Telephone Encounter (Signed)
-----   Message from [name], MD sent at 08/22/2017  9:16 AM EDT ----- Please Inform Patient that labs are normal x Thanks

## 2017-08-22 NOTE — Telephone Encounter (Signed)
lmtcb for lab results

## 2017-08-23 LAB — CUP PACEART REMOTE DEVICE CHECK
Battery Remaining Longevity: 45 mo
Battery Voltage: 2.96 V
Brady Statistic AP VP Percent: 47.85 %
Brady Statistic RA Percent Paced: 47.69 %
Date Time Interrogation Session: 20190426062723
HIGH POWER IMPEDANCE MEASURED VALUE: 67 Ohm
HighPow Impedance: 52 Ohm
Implantable Lead Implant Date: 20060323
Implantable Lead Implant Date: 20060323
Implantable Lead Implant Date: 20110420
Implantable Lead Location: 753858
Implantable Lead Location: 753860
Implantable Lead Model: 7121
Lead Channel Impedance Value: 4047 Ohm
Lead Channel Impedance Value: 513 Ohm
Lead Channel Impedance Value: 513 Ohm
Lead Channel Pacing Threshold Amplitude: 0.75 V
Lead Channel Pacing Threshold Pulse Width: 0.4 ms
Lead Channel Pacing Threshold Pulse Width: 0.4 ms
Lead Channel Pacing Threshold Pulse Width: 0.6 ms
Lead Channel Sensing Intrinsic Amplitude: 17.125 mV
Lead Channel Setting Pacing Amplitude: 1.5 V
Lead Channel Setting Pacing Amplitude: 2.75 V
Lead Channel Setting Pacing Pulse Width: 0.6 ms
MDC IDC LEAD LOCATION: 753859
MDC IDC MSMT LEADCHNL LV IMPEDANCE VALUE: 4047 Ohm
MDC IDC MSMT LEADCHNL LV PACING THRESHOLD AMPLITUDE: 2.125 V
MDC IDC MSMT LEADCHNL RA PACING THRESHOLD AMPLITUDE: 0.625 V
MDC IDC MSMT LEADCHNL RA SENSING INTR AMPL: 3 mV
MDC IDC MSMT LEADCHNL RA SENSING INTR AMPL: 3 mV
MDC IDC MSMT LEADCHNL RV IMPEDANCE VALUE: 399 Ohm
MDC IDC MSMT LEADCHNL RV IMPEDANCE VALUE: 513 Ohm
MDC IDC MSMT LEADCHNL RV SENSING INTR AMPL: 17.125 mV
MDC IDC PG IMPLANT DT: 20160613
MDC IDC SET LEADCHNL LV PACING AMPLITUDE: 3 V
MDC IDC SET LEADCHNL RV PACING PULSEWIDTH: 0.4 ms
MDC IDC SET LEADCHNL RV SENSING SENSITIVITY: 0.6 mV
MDC IDC STAT BRADY AP VS PERCENT: 0.06 %
MDC IDC STAT BRADY AS VP PERCENT: 51.91 %
MDC IDC STAT BRADY AS VS PERCENT: 0.18 %
MDC IDC STAT BRADY RV PERCENT PACED: 99.15 %

## 2017-08-23 NOTE — Telephone Encounter (Signed)
Pt is aware and agreeable to lab results. 

## 2017-08-24 ENCOUNTER — Telehealth: Payer: Self-pay | Admitting: Internal Medicine

## 2017-08-24 ENCOUNTER — Ambulatory Visit (HOSPITAL_COMMUNITY): Payer: Medicare Other | Attending: Cardiology

## 2017-08-24 ENCOUNTER — Other Ambulatory Visit: Payer: Self-pay

## 2017-08-24 DIAGNOSIS — Z95810 Presence of automatic (implantable) cardiac defibrillator: Secondary | ICD-10-CM | POA: Insufficient documentation

## 2017-08-24 DIAGNOSIS — Z951 Presence of aortocoronary bypass graft: Secondary | ICD-10-CM | POA: Insufficient documentation

## 2017-08-24 DIAGNOSIS — I517 Cardiomegaly: Secondary | ICD-10-CM | POA: Insufficient documentation

## 2017-08-24 DIAGNOSIS — Z8249 Family history of ischemic heart disease and other diseases of the circulatory system: Secondary | ICD-10-CM | POA: Diagnosis not present

## 2017-08-24 DIAGNOSIS — E119 Type 2 diabetes mellitus without complications: Secondary | ICD-10-CM | POA: Insufficient documentation

## 2017-08-24 DIAGNOSIS — I5022 Chronic systolic (congestive) heart failure: Secondary | ICD-10-CM | POA: Diagnosis not present

## 2017-08-24 DIAGNOSIS — I428 Other cardiomyopathies: Secondary | ICD-10-CM | POA: Diagnosis not present

## 2017-08-24 NOTE — Telephone Encounter (Signed)
Walk in pt Form-Handicapped form dropped off placed in [facility] doc box.

## 2017-08-31 ENCOUNTER — Other Ambulatory Visit: Payer: Self-pay | Admitting: Internal Medicine

## 2017-09-05 ENCOUNTER — Other Ambulatory Visit: Payer: Self-pay

## 2017-09-05 MED ORDER — SACUBITRIL-VALSARTAN 24-26 MG PO TABS: 1.0000 | ORAL_TABLET | Freq: Two times a day (BID) | ORAL | 3 refills | Status: DC

## 2017-09-05 NOTE — Progress Notes (Signed)
Notes recorded by Stephani Police, RN on 09/05/2017 at 9:16 AM EDT Pt came in and we gave him a month supply of Entresto. [facility] Pharm D spoke with the pt and scheduled him for BP check on 09/28/17. ------  Notes recorded by Dollene Primrose, RN on 08/31/2017 at 12:40 PM EDT Pt was given echo results and agreed with plan to begin [facility] with lab and bp checks for titration. He will be by the office on 6/3 to pick it up. ------  Notes recorded by Dollene Primrose, RN on 08/31/2017 at 11:18 AM EDT LVM for return call. ------  Notes recorded by [name], MD on 08/29/2017 at 11:57 AM EDT Please Inform Patient Echo shows some worsening heart muscle function  45%>> 25% Lets try him on entresto 24/26 Lthx

## 2017-09-07 ENCOUNTER — Telehealth: Payer: Self-pay | Admitting: Internal Medicine

## 2017-09-07 DIAGNOSIS — I5022 Chronic systolic (congestive) heart failure: Secondary | ICD-10-CM

## 2017-09-07 NOTE — Telephone Encounter (Signed)
Left message for [facility] that we are aware and ok for pt to take Entresto and Spironolactone together. Pt to keep f/u BP check will do BMET at visit to check K per [facility].

## 2017-09-07 NOTE — Telephone Encounter (Signed)
New Message:      Please call, question about interaction between [facility] and Spironolactone. Called refill pool and was instructed to send to a nurse

## 2017-09-07 NOTE — Telephone Encounter (Signed)
OK to take together. Risk is increased potassium. We will be check a BMET when he comes for follow up so advise him to be sure to keep appt.

## 2017-09-07 NOTE — Telephone Encounter (Signed)
Left message for patient to call back re: his question re: taking Entresto and Spironolactone

## 2017-09-28 ENCOUNTER — Ambulatory Visit (INDEPENDENT_AMBULATORY_CARE_PROVIDER_SITE_OTHER): Payer: Medicare Other | Admitting: Pharmacist

## 2017-09-28 ENCOUNTER — Other Ambulatory Visit: Payer: Medicare Other | Admitting: *Deleted

## 2017-09-28 VITALS — BP 116/74 | HR 70

## 2017-09-28 DIAGNOSIS — I5042 Chronic combined systolic (congestive) and diastolic (congestive) heart failure: Secondary | ICD-10-CM

## 2017-09-28 DIAGNOSIS — I5022 Chronic systolic (congestive) heart failure: Secondary | ICD-10-CM | POA: Diagnosis not present

## 2017-09-28 LAB — BASIC METABOLIC PANEL
BUN/Creatinine Ratio: 9 — ABNORMAL LOW (ref 10–24)
BUN: 11 mg/dL (ref 8–27)
CO2: 24 mmol/L (ref 20–29)
CREATININE: 1.17 mg/dL (ref 0.76–1.27)
Calcium: 9 mg/dL (ref 8.6–10.2)
Chloride: 104 mmol/L (ref 96–106)
GFR, EST AFRICAN AMERICAN: 74 mL/min/{1.73_m2} (ref >59)
GFR, EST NON AFRICAN AMERICAN: 64 mL/min/{1.73_m2} (ref >59)
Glucose: 210 mg/dL — ABNORMAL HIGH (ref 65–99)
Potassium: 4.3 mmol/L (ref 3.5–5.2)
SODIUM: 139 mmol/L (ref 134–144)

## 2017-09-28 NOTE — Patient Instructions (Addendum)
It was nice to see you today  If your labs are stable, we will increase your Entresto to 49-51mg  twice a day  Continue taking your other medications  Limit your fluid intake to less than 2 L a day  Limit your sodium intake to less than 2g (2,000mg ) a day  Call J[facility] Blossom with any questions [phone]  Follow up in clinic in 2 weeks for blood pressure check and lab work

## 2017-09-28 NOTE — Progress Notes (Addendum)
Patient ID: Russell Fuentes                 DOB: 12/17/1950                      MRN: [id number]     HPI: Russell Fuentes is a 67 y.o. male referred by Dr. [name] to pharmacy clinic for HF medication optimization. PMH is significant for nonischemic cardiomyopathy, DM, systolic HF, pancreatitis, and orthostasis. Echo 08/29/17 showed worsening LVEF from 45% to 25-30% and he was started on Entresto. He presents today for follow up.  Patient presents today in good spirits. He reports feeling some chest pain when he started his Entresto, however reports that this has resolved. He has had joint pain for the past few weeks and wonders if this may be due to the [facility] since he does not have a history of joint pain. He previously took valsartan on its own and tolerated it well. He discontinued therapy due to the recent recall. He reports previously taking losartan however states that this affected his lungs. He is occasionally SOB with exertion. He does not check his BP at home, however denies dizziness since starting Entresto. 1 month of Delene Loll is $35 at his pharmacy which is affordable.  Current HF meds: carvedilol 25mg  BID, Entresto 24-26mg  BID, spironolactone 12.5mg  daily  BP goal: <130/2mmHg  Family History: Father, mother, and sister with heart attacks, brother with HTN.  Social History: Denies tobacco, alcohol, and illicit drug use.  Diet: Drinks water and Diet Pepsi. Does not add salt to food.  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  Wt Readings from Last 3 Encounters:  08/15/17 243 lb (110.2 kg)  08/03/16 249 lb 6 oz (113.1 kg)  08/04/15 254 lb (115.2 kg)   BP Readings from Last 3 Encounters:  08/15/17 116/80  02/06/17 130/78  08/03/16 128/78   Pulse Readings from Last 3 Encounters:  08/15/17 70  02/06/17 77  08/03/16 75    Renal function: CrCl cannot be calculated (Patient's most recent lab result is older than the maximum 21 days  allowed.).  Past Medical History:  Diagnosis Date  . Chronic combined systolic and diastolic heart failure ([place])    . Diabetes mellitus with neuropathy ([place])    . Nonischemic cardiomyopathy ([place])     a. s/p MDT CRTD  . Orthostatic lightheadedness      Current Outpatient Medications on File Prior to Visit  Medication Sig Dispense Refill  . carvedilol (COREG) 25 MG tablet TAKE 1 TABLET(25 MG) BY MOUTH TWICE DAILY 180 tablet 0  . LOVASTATIN PO Take 20 mg by mouth daily.    . ondansetron (ZOFRAN) 4 MG tablet Take 1 tablet (4 mg total) every 8 (eight) hours as needed by mouth for nausea or vomiting. 12 tablet 0  . sacubitril-valsartan (ENTRESTO) 24-26 MG Take 1 tablet by mouth 2 (two) times daily. 60 tablet 3  . spironolactone (ALDACTONE) 25 MG tablet TAKE 1/2 TABLET(12.5 MG) BY MOUTH DAILY 45 tablet 3   No current facility-administered medications on file prior to visit.     Allergies  Allergen Reactions  . Codeine Rash     Assessment/Plan:  1. Hypertension/HF medication optimization - BP at goal <130/11mmHg. Will check BMET today and if stable, increase Entresto to 49-51mg  BID. Continue carvedilol 25mg  BID and spironolactone 12.5mg  daily. Counseled pt on weighing himself daily and adhering to 2L fluid and 2g sodium  diet. Pt has been attributing joint pain over the past few weeks to his Entresto. If this worsens, may need to switch back to ARB monotherapy which pt has previously tolerated. Will f/u in clinic in 2 weeks for BP check and BMET.    Megan E. Supple, PharmD, BCACP, [place] [address] [place], [place] [address] Phone: [phone]; Fax: [phone] 09/28/2017 10:07 AM   Addendum: BMET stable, ok to increase Entresto to 49-51mg  BID as discussed at office visit today. LMOM for patient.

## 2017-10-10 ENCOUNTER — Other Ambulatory Visit: Payer: Self-pay | Admitting: Internal Medicine

## 2017-10-13 ENCOUNTER — Ambulatory Visit (INDEPENDENT_AMBULATORY_CARE_PROVIDER_SITE_OTHER): Payer: Medicare Other | Admitting: Pharmacist

## 2017-10-13 ENCOUNTER — Other Ambulatory Visit: Payer: Self-pay | Admitting: Internal Medicine

## 2017-10-13 ENCOUNTER — Encounter (INDEPENDENT_AMBULATORY_CARE_PROVIDER_SITE_OTHER): Payer: Self-pay

## 2017-10-13 VITALS — BP 108/66 | HR 83 | Wt 245.0 lb

## 2017-10-13 DIAGNOSIS — I5042 Chronic combined systolic (congestive) and diastolic (congestive) heart failure: Secondary | ICD-10-CM | POA: Diagnosis not present

## 2017-10-13 MED ORDER — VALSARTAN 80 MG PO TABS: 80.0000 mg | ORAL_TABLET | Freq: Every day | ORAL | 0 refills | Status: DC

## 2017-10-13 NOTE — Progress Notes (Signed)
Patient ID: Russell Fuentes                 DOB: 09/13/1950                      MRN: [id number]     HPI: Russell Fuentes is a 67 y.o. male referred by Dr. [name] to pharmacy clinic for HF medication optimization. PMH is significant for nonischemic cardiomyopathy, DM, systolic HF, pancreatitis, and orthostasis. Echo 08/29/17 showed worsening LVEF from 45% to 25-30% and he was started on Entresto. At last visit, Entresto was increased to 49-51mg  BID. Pt presents today for follow up.  Patient presents today in good spirits. He reports less energy, joint pain in his knees, back, and shoulder, mucous production, and increase in fasting blood sugar from 125 to 150-170 since starting Entresto. He previously took valsartan on its own and tolerated it well. He discontinued therapy due to the recent recall. He reports previously taking losartan however states that this affected his lungs. He is occasionally SOB with exertion. BP has been well controlled 116-120/60s at home. He has felt like his BP dropped low a few times when he felt dizzy after bending over and standing back up again. 1 month of Delene Loll is $35 at his pharmacy which is affordable.  Current HF meds: carvedilol 25mg  BID, Entresto 49-51mg  BID, spironolactone 12.5mg  daily (AM)  BP goal: <130/1mmHg  Family History: Father, mother, and sister with heart attacks, brother with HTN.  Social History: Denies tobacco, alcohol, and illicit drug use.  Diet: Drinks water and Diet Pepsi. Does not add salt to food.  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  Home BP readings: Has not been checking.  Wt Readings from Last 3 Encounters:  08/15/17 243 lb (110.2 kg)  08/03/16 249 lb 6 oz (113.1 kg)  08/04/15 254 lb (115.2 kg)   BP Readings from Last 3 Encounters:  09/28/17 116/74  08/15/17 116/80  02/06/17 130/78   Pulse Readings from Last 3 Encounters:  09/28/17 70  08/15/17 70  02/06/17 77    Renal function: CrCl cannot be  calculated (Unknown ideal weight.).  Past Medical History:  Diagnosis Date  . Chronic combined systolic and diastolic heart failure ([place])    . Diabetes mellitus with neuropathy ([place])    . Nonischemic cardiomyopathy ([place])     a. s/p MDT CRTD  . Orthostatic lightheadedness      Current Outpatient Medications on File Prior to Visit  Medication Sig Dispense Refill  . carvedilol (COREG) 25 MG tablet TAKE 1 TABLET(25 MG) BY MOUTH TWICE DAILY 180 tablet 3  . LOVASTATIN PO Take 20 mg by mouth daily.    . ondansetron (ZOFRAN) 4 MG tablet Take 1 tablet (4 mg total) every 8 (eight) hours as needed by mouth for nausea or vomiting. 12 tablet 0  . sacubitril-valsartan (ENTRESTO) 49-51 MG Take 1 tablet by mouth 2 (two) times daily.    [name] spironolactone (ALDACTONE) 25 MG tablet TAKE 1/2 TABLET(12.5 MG) BY MOUTH DAILY 45 tablet 3   No current facility-administered medications on file prior to visit.     Allergies  Allergen Reactions  . Codeine Rash     Assessment/Plan:  1. Hypertension/HF medication optimization - BP at goal <130/76mmHg. Pt reports many side effects that he attributes to his Entresto, including increased mucous production, joint pain, higher blood sugar readings, and fatigue. Discussed that Entresto typically does not affect congestion, joints, or blood  sugar. Discussed that fatigue may be from BP being on the lower side today, but may also be due to reduced LVEF. Will try stopping Entresto for 2 weeks and starting valsartan 80mg  daily to see if any of patient's symptoms improve. If not, will resume Entresto and have pt follow up with PCP. Will continue carvedilol 25mg  BID and spironolactone 12.5mg  daily for HFrEF. Will f/u with pt on the phone in 2 weeks to discuss medication tolerability, and see pt in clinic in 4 weeks for BP check.   Aeris Hersman E. Zaylon Bossier, PharmD, BCACP, [place] [address], [place], [place] [address] Phone: [phone]; Fax: [phone] 10/13/2017 7:08 AM

## 2017-10-13 NOTE — Patient Instructions (Signed)
Stop your Entresto  Start valsartan 80mg  once a day. If your top blood pressure reading stays above 130 after a week, you can double up and take 160mg  a day (2 tablets)  Continue taking your carvedilol and spironolactone   We will touch base in 2 weeks on the phone to see how you are feeling  Follow up in clinic in 4 weeks

## 2017-10-31 ENCOUNTER — Ambulatory Visit (INDEPENDENT_AMBULATORY_CARE_PROVIDER_SITE_OTHER): Payer: Medicare Other | Admitting: *Deleted

## 2017-10-31 DIAGNOSIS — I5042 Chronic combined systolic (congestive) and diastolic (congestive) heart failure: Secondary | ICD-10-CM

## 2017-10-31 DIAGNOSIS — I428 Other cardiomyopathies: Secondary | ICD-10-CM

## 2017-10-31 NOTE — Progress Notes (Signed)
Remote ICD transmission.   

## 2017-11-01 ENCOUNTER — Encounter: Payer: Self-pay | Admitting: Cardiology

## 2017-11-01 ENCOUNTER — Telehealth: Payer: Self-pay | Admitting: Pharmacist

## 2017-11-01 NOTE — Telephone Encounter (Signed)
Called pt to follow up to see how he has been feeling since switching from Entresto back to valsartan. He reports feeling much better - has more energy overall and joint pain has improved in shoulders and elbows. Blood sugar 95-125. BP reading 128/68 today. Will continue valsartan 80mg  daily and pt will keep f/u on 8/8 in pharmacy clinic.

## 2017-11-04 LAB — CUP PACEART REMOTE DEVICE CHECK
Battery Remaining Longevity: 39 mo
Battery Voltage: 2.92 V
Brady Statistic AP VP Percent: 42.93 %
Brady Statistic AP VS Percent: 0.05 %
Brady Statistic AS VP Percent: 56.85 %
Brady Statistic AS VS Percent: 0.16 %
Brady Statistic RV Percent Paced: 99.1 %
HIGH POWER IMPEDANCE MEASURED VALUE: 46 Ohm
HighPow Impedance: 57 Ohm
Implantable Lead Implant Date: 20060323
Implantable Lead Implant Date: 20110420
Implantable Lead Location: 753858
Implantable Lead Location: 753859
Implantable Lead Location: 753860
Implantable Lead Model: 4193
Implantable Lead Model: 5076
Implantable Pulse Generator Implant Date: 20160613
Lead Channel Impedance Value: 285 Ohm
Lead Channel Impedance Value: 399 Ohm
Lead Channel Impedance Value: 4047 Ohm
Lead Channel Impedance Value: 475 Ohm
Lead Channel Pacing Threshold Amplitude: 0.625 V
Lead Channel Pacing Threshold Amplitude: 1.125 V
Lead Channel Pacing Threshold Amplitude: 1.625 V
Lead Channel Pacing Threshold Pulse Width: 0.4 ms
Lead Channel Sensing Intrinsic Amplitude: 2.875 mV
Lead Channel Sensing Intrinsic Amplitude: 2.875 mV
Lead Channel Sensing Intrinsic Amplitude: 20.625 mV
Lead Channel Setting Pacing Amplitude: 2.5 V
Lead Channel Setting Pacing Amplitude: 3.25 V
Lead Channel Setting Sensing Sensitivity: 0.6 mV
MDC IDC LEAD IMPLANT DT: 20060323
MDC IDC MSMT LEADCHNL LV IMPEDANCE VALUE: 4047 Ohm
MDC IDC MSMT LEADCHNL LV PACING THRESHOLD PULSEWIDTH: 0.6 ms
MDC IDC MSMT LEADCHNL RA PACING THRESHOLD PULSEWIDTH: 0.4 ms
MDC IDC MSMT LEADCHNL RV IMPEDANCE VALUE: 418 Ohm
MDC IDC MSMT LEADCHNL RV SENSING INTR AMPL: 20.625 mV
MDC IDC SESS DTM: 20190729073524
MDC IDC SET LEADCHNL LV PACING PULSEWIDTH: 0.6 ms
MDC IDC SET LEADCHNL RA PACING AMPLITUDE: 1.5 V
MDC IDC SET LEADCHNL RV PACING PULSEWIDTH: 0.4 ms
MDC IDC STAT BRADY RA PERCENT PACED: 42.76 %

## 2017-11-10 ENCOUNTER — Ambulatory Visit (INDEPENDENT_AMBULATORY_CARE_PROVIDER_SITE_OTHER): Payer: Medicare Other

## 2017-11-10 ENCOUNTER — Other Ambulatory Visit: Payer: Self-pay | Admitting: Internal Medicine

## 2017-11-10 ENCOUNTER — Telehealth: Payer: Self-pay | Admitting: Internal Medicine

## 2017-11-10 VITALS — BP 118/74 | HR 73

## 2017-11-10 DIAGNOSIS — I5042 Chronic combined systolic (congestive) and diastolic (congestive) heart failure: Secondary | ICD-10-CM

## 2017-11-10 NOTE — Telephone Encounter (Signed)
New message    *STAT* If patient is at the pharmacy, call can be transferred to refill team.   1. Which medications need to be refilled? (please list name of each medication and dose if known) valsartan (DIOVAN) 80 MG tablet  2. Which pharmacy/location (including street and city if local pharmacy) is medication to be sent to?[id number] - [place], [place] - [address] AT [address] [place] [address] 3. Do they need a 30 day or 90 day supply? [place]

## 2017-11-10 NOTE — Telephone Encounter (Signed)
Patient has refills at requested pharmacy as below. I called patient to make him aware of this but did not get an answer, left him a vm.

## 2017-11-10 NOTE — Patient Instructions (Addendum)
It was great to see you today.  Continue taking carvedilol 25mg  BID, spironolactone 12.5mg  daily (AM), valsartan 80mg  daily (PM).  Continue taking your blood pressure at home. Try taking your blood pressure at different times of day.  Continue to limit the amount of sodium in your diet.   We will recheck an echo of your heart at the end of August.

## 2017-11-10 NOTE — Telephone Encounter (Signed)
Outpatient Medication Detail    Disp Refills Start End   valsartan (DIOVAN) 80 MG tablet 90 tablet 3 10/13/2017    Sig: TAKE 1 TABLET BY MOUTH EVERY DAY   Sent to pharmacy as: valsartan (DIOVAN) 80 MG tablet   Notes to Pharmacy: **Patient requests 90 days supply**   E-Prescribing Status: Receipt confirmed by pharmacy (10/13/2017 12:54 PM EDT)   [facility], [place] - [address] AT [place]

## 2017-11-10 NOTE — Progress Notes (Signed)
Patient ID: Russell Fuentes                 DOB: 27-Jan-1951                      MRN: [id number]     HPI: Russell Fuentes is a 67 y.o. male referred by Dr. [name] to pharmacy clinic for HF medication optimization. PMH is significant for nonischemic cardiomyopathy, DM, systolic HF, pancreatitis, and orthostasis. Echo 08/29/17 showed worsening LVEF from 45% to 25-30% and he was started on Entresto. At last visit, Delene Loll was discontinued due to side effects and was started on valsartan 80mg  daily.    Russell Fuentes presents today in good spirits. He denies fatigue since switching from Entresto to valsartan 80mg  daily. He endorses some chest tightness that resolves with rest that has been going on "a long time," no acute changes. Patient denies dizziness or hypotension. He reports his blood glucose has also returned to normal since stopping the [facility] and he no longer has the joint pain. Patient reports he did gain some weight (from 233lbs to 240lbs on home scale) due to special events at his church and not eating well. He was counseled on the importance of a low sodium diet. Patient enquired about having an echo to recheck his EF and was counseled an echo can be rechecked in 3 months once on optimized medications. He reports getting exercise around the house and anticipates his activity level to increase since hunting season begins soon.    Current HTN meds: carvedilol 25mg  BID, spironolactone 12.5mg  daily (AM), valsartan 80mg  daily (PM) Previously tried: Entresto 49-51mg  BID (fatigue, pain in joints, hyperglycemia)  BP goal: <130/61mmHg  Family History: Father, mother, and sister with heart attacks, brother with HTN.  Social History: Denies tobacco, alcohol, and illicit drug use.  Diet: Drinks water and Diet Pepsi. Does not add salt to food.  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 Enjoys hunting   Home BP readings: ~130-135/~75-78mmHg (prior to AM medications); some lower reading in  the afternoon   Wt Readings from Last 3 Encounters:  10/13/17 245 lb (111.1 kg)  08/15/17 243 lb (110.2 kg)  08/03/16 249 lb 6 oz (113.1 kg)   BP Readings from Last 3 Encounters:  10/13/17 108/66  09/28/17 116/74  08/15/17 116/80   Pulse Readings from Last 3 Encounters:  10/13/17 83  09/28/17 70  08/15/17 70    Renal function: CrCl cannot be calculated (Patient's most recent lab result is older than the maximum 21 days allowed.).  Past Medical History:  Diagnosis Date  . Chronic combined systolic and diastolic heart failure ([place])    . Diabetes mellitus with neuropathy ([place])    . Nonischemic cardiomyopathy ([place])     a. s/p MDT CRTD  . Orthostatic lightheadedness      Current Outpatient Medications on File Prior to Visit  Medication Sig Dispense Refill  . carvedilol (COREG) 25 MG tablet TAKE 1 TABLET(25 MG) BY MOUTH TWICE DAILY 180 tablet 3  . LOVASTATIN PO Take 20 mg by mouth daily.    . ondansetron (ZOFRAN) 4 MG tablet Take 1 tablet (4 mg total) every 8 (eight) hours as needed by mouth for nausea or vomiting. 12 tablet 0  . spironolactone (ALDACTONE) 25 MG tablet TAKE 1/2 TABLET(12.5 MG) BY MOUTH DAILY 45 tablet 3  . valsartan (DIOVAN) 80 MG tablet TAKE 1 TABLET BY MOUTH EVERY DAY 90 tablet 3  No current facility-administered medications on file prior to visit.     Allergies  Allergen Reactions  . Entresto [Sacubitril-Valsartan]     Fatigue, joint pain. Ok on valsartan alone  . Codeine Rash     Assessment/Plan:  1. Hypertension/HF medication optimization - Patient is at goal of <130/57mmHg. Patient was intolerant to Entresto 49-51mg  BID experiencing fatigue, joint pain, and increase blood glucose. Patient reported all symptoms resolving on valsartan 80mg  daily alone. Patient has blood pressure readings of 118/48mmHg and 112/8mmHg at this appointment, thus, unable to further titrate valsartan. Continue carvedilol 25mg  BID, spironolactone 12.5mg  daily (AM),  valsartan 80mg  daily (PM). Continue to try to reduce sodium in diet. Plan to recheck echo the end of August to reevaluated LVEF.    Russell Fuentes, Sherian Rein D PGY1 Pharmacy Resident  Phone [phone] 11/10/2017      9:46 AM

## 2017-11-18 DIAGNOSIS — E1122 Type 2 diabetes mellitus with diabetic chronic kidney disease: Secondary | ICD-10-CM | POA: Diagnosis not present

## 2017-11-18 DIAGNOSIS — I5022 Chronic systolic (congestive) heart failure: Secondary | ICD-10-CM | POA: Diagnosis not present

## 2017-11-18 DIAGNOSIS — N183 Chronic kidney disease, stage 3 (moderate): Secondary | ICD-10-CM | POA: Diagnosis not present

## 2017-11-18 DIAGNOSIS — E782 Mixed hyperlipidemia: Secondary | ICD-10-CM | POA: Diagnosis not present

## 2017-11-18 DIAGNOSIS — I130 Hypertensive heart and chronic kidney disease with heart failure and stage 1 through stage 4 chronic kidney disease, or unspecified chronic kidney disease: Secondary | ICD-10-CM | POA: Diagnosis not present

## 2017-11-18 DIAGNOSIS — Z7984 Long term (current) use of oral hypoglycemic drugs: Secondary | ICD-10-CM | POA: Diagnosis not present

## 2017-11-18 DIAGNOSIS — Z6835 Body mass index (BMI) 35.0-35.9, adult: Secondary | ICD-10-CM | POA: Diagnosis not present

## 2017-12-01 ENCOUNTER — Other Ambulatory Visit: Payer: Self-pay

## 2017-12-01 ENCOUNTER — Ambulatory Visit (HOSPITAL_COMMUNITY): Payer: Medicare Other | Attending: Cardiology

## 2017-12-01 DIAGNOSIS — I5042 Chronic combined systolic (congestive) and diastolic (congestive) heart failure: Secondary | ICD-10-CM | POA: Diagnosis not present

## 2017-12-01 DIAGNOSIS — I429 Cardiomyopathy, unspecified: Secondary | ICD-10-CM | POA: Insufficient documentation

## 2017-12-01 DIAGNOSIS — Z6835 Body mass index (BMI) 35.0-35.9, adult: Secondary | ICD-10-CM | POA: Insufficient documentation

## 2017-12-01 DIAGNOSIS — E119 Type 2 diabetes mellitus without complications: Secondary | ICD-10-CM | POA: Diagnosis not present

## 2017-12-01 DIAGNOSIS — E669 Obesity, unspecified: Secondary | ICD-10-CM | POA: Insufficient documentation

## 2018-01-05 DIAGNOSIS — D3613 Benign neoplasm of peripheral nerves and autonomic nervous system of lower limb, including hip: Secondary | ICD-10-CM | POA: Diagnosis not present

## 2018-01-05 DIAGNOSIS — Z85828 Personal history of other malignant neoplasm of skin: Secondary | ICD-10-CM | POA: Diagnosis not present

## 2018-01-05 DIAGNOSIS — L570 Actinic keratosis: Secondary | ICD-10-CM | POA: Diagnosis not present

## 2018-01-05 DIAGNOSIS — L821 Other seborrheic keratosis: Secondary | ICD-10-CM | POA: Diagnosis not present

## 2018-01-05 DIAGNOSIS — L812 Freckles: Secondary | ICD-10-CM | POA: Diagnosis not present

## 2018-01-05 DIAGNOSIS — L578 Other skin changes due to chronic exposure to nonionizing radiation: Secondary | ICD-10-CM | POA: Diagnosis not present

## 2018-01-30 ENCOUNTER — Ambulatory Visit (INDEPENDENT_AMBULATORY_CARE_PROVIDER_SITE_OTHER): Payer: Medicare Other | Admitting: *Deleted

## 2018-01-30 DIAGNOSIS — I428 Other cardiomyopathies: Secondary | ICD-10-CM | POA: Diagnosis not present

## 2018-01-30 DIAGNOSIS — I5022 Chronic systolic (congestive) heart failure: Secondary | ICD-10-CM

## 2018-01-31 NOTE — Progress Notes (Signed)
Remote ICD transmission.   

## 2018-02-08 DIAGNOSIS — Z23 Encounter for immunization: Secondary | ICD-10-CM | POA: Diagnosis not present

## 2018-02-14 DIAGNOSIS — H6123 Impacted cerumen, bilateral: Secondary | ICD-10-CM | POA: Diagnosis not present

## 2018-03-27 LAB — CUP PACEART REMOTE DEVICE CHECK
Battery Remaining Longevity: 43 mo
Brady Statistic AS VS Percent: 0.15 %
Brady Statistic RA Percent Paced: 43.65 %
Brady Statistic RV Percent Paced: 99.19 %
HighPow Impedance: 51 Ohm
HighPow Impedance: 64 Ohm
Implantable Lead Implant Date: 20060323
Implantable Lead Implant Date: 20110420
Implantable Lead Location: 753859
Implantable Lead Model: 4193
Implantable Lead Model: 5076
Implantable Lead Model: 7121
Implantable Pulse Generator Implant Date: 20160613
Lead Channel Impedance Value: 532 Ohm
Lead Channel Pacing Threshold Amplitude: 1.375 V
Lead Channel Pacing Threshold Pulse Width: 0.6 ms
Lead Channel Sensing Intrinsic Amplitude: 18.625 mV
Lead Channel Sensing Intrinsic Amplitude: 18.625 mV
Lead Channel Sensing Intrinsic Amplitude: 4.75 mV
Lead Channel Sensing Intrinsic Amplitude: 4.75 mV
Lead Channel Setting Pacing Amplitude: 1.5 V
Lead Channel Setting Pacing Amplitude: 2 V
Lead Channel Setting Sensing Sensitivity: 0.6 mV
MDC IDC LEAD IMPLANT DT: 20060323
MDC IDC LEAD LOCATION: 753858
MDC IDC LEAD LOCATION: 753860
MDC IDC MSMT BATTERY VOLTAGE: 2.9 V
MDC IDC MSMT LEADCHNL LV IMPEDANCE VALUE: 4047 Ohm
MDC IDC MSMT LEADCHNL LV IMPEDANCE VALUE: 4047 Ohm
MDC IDC MSMT LEADCHNL LV IMPEDANCE VALUE: 418 Ohm
MDC IDC MSMT LEADCHNL LV PACING THRESHOLD AMPLITUDE: 1.5 V
MDC IDC MSMT LEADCHNL RA PACING THRESHOLD AMPLITUDE: 0.5 V
MDC IDC MSMT LEADCHNL RA PACING THRESHOLD PULSEWIDTH: 0.4 ms
MDC IDC MSMT LEADCHNL RV IMPEDANCE VALUE: 342 Ohm
MDC IDC MSMT LEADCHNL RV IMPEDANCE VALUE: 456 Ohm
MDC IDC MSMT LEADCHNL RV PACING THRESHOLD PULSEWIDTH: 0.4 ms
MDC IDC SESS DTM: 20191028041703
MDC IDC SET LEADCHNL LV PACING PULSEWIDTH: 0.6 ms
MDC IDC SET LEADCHNL RV PACING AMPLITUDE: 2.75 V
MDC IDC SET LEADCHNL RV PACING PULSEWIDTH: 0.4 ms
MDC IDC STAT BRADY AP VP PERCENT: 43.78 %
MDC IDC STAT BRADY AP VS PERCENT: 0.06 %
MDC IDC STAT BRADY AS VP PERCENT: 56.01 %

## 2018-05-01 ENCOUNTER — Ambulatory Visit (INDEPENDENT_AMBULATORY_CARE_PROVIDER_SITE_OTHER): Payer: Medicare Other

## 2018-05-01 DIAGNOSIS — I5022 Chronic systolic (congestive) heart failure: Secondary | ICD-10-CM

## 2018-05-01 DIAGNOSIS — I428 Other cardiomyopathies: Secondary | ICD-10-CM | POA: Diagnosis not present

## 2018-05-02 NOTE — Progress Notes (Signed)
Remote ICD transmission.   

## 2018-05-04 LAB — CUP PACEART REMOTE DEVICE CHECK
Battery Voltage: 2.96 V
Brady Statistic AP VP Percent: 48.67 %
Brady Statistic AP VS Percent: 0.06 %
Brady Statistic AS VP Percent: 51.16 %
Brady Statistic RA Percent Paced: 48.5 %
HIGH POWER IMPEDANCE MEASURED VALUE: 51 Ohm
HIGH POWER IMPEDANCE MEASURED VALUE: 62 Ohm
Implantable Lead Implant Date: 20110420
Implantable Lead Location: 753858
Implantable Lead Location: 753859
Implantable Lead Location: 753860
Implantable Lead Model: 4193
Implantable Lead Model: 5076
Implantable Lead Model: 7121
Lead Channel Impedance Value: 4047 Ohm
Lead Channel Impedance Value: 4047 Ohm
Lead Channel Impedance Value: 475 Ohm
Lead Channel Impedance Value: 475 Ohm
Lead Channel Impedance Value: 551 Ohm
Lead Channel Pacing Threshold Amplitude: 0.5 V
Lead Channel Pacing Threshold Pulse Width: 0.4 ms
Lead Channel Pacing Threshold Pulse Width: 0.4 ms
Lead Channel Setting Pacing Amplitude: 2.25 V
Lead Channel Setting Pacing Amplitude: 2.75 V
Lead Channel Setting Pacing Pulse Width: 0.4 ms
Lead Channel Setting Pacing Pulse Width: 0.6 ms
MDC IDC LEAD IMPLANT DT: 20060323
MDC IDC LEAD IMPLANT DT: 20060323
MDC IDC MSMT BATTERY REMAINING LONGEVITY: 42 mo
MDC IDC MSMT LEADCHNL LV PACING THRESHOLD AMPLITUDE: 1.375 V
MDC IDC MSMT LEADCHNL LV PACING THRESHOLD PULSEWIDTH: 0.6 ms
MDC IDC MSMT LEADCHNL RA SENSING INTR AMPL: 3.25 mV
MDC IDC MSMT LEADCHNL RA SENSING INTR AMPL: 3.25 mV
MDC IDC MSMT LEADCHNL RV IMPEDANCE VALUE: 456 Ohm
MDC IDC MSMT LEADCHNL RV PACING THRESHOLD AMPLITUDE: 1.375 V
MDC IDC MSMT LEADCHNL RV SENSING INTR AMPL: 20.125 mV
MDC IDC MSMT LEADCHNL RV SENSING INTR AMPL: 20.125 mV
MDC IDC PG IMPLANT DT: 20160613
MDC IDC SESS DTM: 20200127081604
MDC IDC SET LEADCHNL RA PACING AMPLITUDE: 1.5 V
MDC IDC SET LEADCHNL RV SENSING SENSITIVITY: 0.6 mV
MDC IDC STAT BRADY AS VS PERCENT: 0.11 %
MDC IDC STAT BRADY RV PERCENT PACED: 99.3 %

## 2018-05-05 DIAGNOSIS — S60551A Superficial foreign body of right hand, initial encounter: Secondary | ICD-10-CM | POA: Diagnosis not present

## 2018-05-16 DIAGNOSIS — Z125 Encounter for screening for malignant neoplasm of prostate: Secondary | ICD-10-CM | POA: Diagnosis not present

## 2018-05-16 DIAGNOSIS — E669 Obesity, unspecified: Secondary | ICD-10-CM | POA: Diagnosis not present

## 2018-05-16 DIAGNOSIS — H9319 Tinnitus, unspecified ear: Secondary | ICD-10-CM | POA: Diagnosis not present

## 2018-05-16 DIAGNOSIS — Z0000 Encounter for general adult medical examination without abnormal findings: Secondary | ICD-10-CM | POA: Diagnosis not present

## 2018-05-16 DIAGNOSIS — M545 Low back pain: Secondary | ICD-10-CM | POA: Diagnosis not present

## 2018-05-16 DIAGNOSIS — I5022 Chronic systolic (congestive) heart failure: Secondary | ICD-10-CM | POA: Diagnosis not present

## 2018-05-16 DIAGNOSIS — I130 Hypertensive heart and chronic kidney disease with heart failure and stage 1 through stage 4 chronic kidney disease, or unspecified chronic kidney disease: Secondary | ICD-10-CM | POA: Diagnosis not present

## 2018-05-16 DIAGNOSIS — N183 Chronic kidney disease, stage 3 (moderate): Secondary | ICD-10-CM | POA: Diagnosis not present

## 2018-05-16 DIAGNOSIS — J309 Allergic rhinitis, unspecified: Secondary | ICD-10-CM | POA: Diagnosis not present

## 2018-05-16 DIAGNOSIS — I429 Cardiomyopathy, unspecified: Secondary | ICD-10-CM | POA: Diagnosis not present

## 2018-05-16 DIAGNOSIS — E1122 Type 2 diabetes mellitus with diabetic chronic kidney disease: Secondary | ICD-10-CM | POA: Diagnosis not present

## 2018-05-16 DIAGNOSIS — E782 Mixed hyperlipidemia: Secondary | ICD-10-CM | POA: Diagnosis not present

## 2018-05-29 DIAGNOSIS — H9193 Unspecified hearing loss, bilateral: Secondary | ICD-10-CM | POA: Diagnosis not present

## 2018-05-29 DIAGNOSIS — R42 Dizziness and giddiness: Secondary | ICD-10-CM | POA: Diagnosis not present

## 2018-06-01 DIAGNOSIS — M47816 Spondylosis without myelopathy or radiculopathy, lumbar region: Secondary | ICD-10-CM | POA: Diagnosis not present

## 2018-06-05 DIAGNOSIS — J324 Chronic pansinusitis: Secondary | ICD-10-CM | POA: Diagnosis not present

## 2018-07-10 DIAGNOSIS — R972 Elevated prostate specific antigen [PSA]: Secondary | ICD-10-CM | POA: Diagnosis not present

## 2018-07-10 DIAGNOSIS — N402 Nodular prostate without lower urinary tract symptoms: Secondary | ICD-10-CM | POA: Diagnosis not present

## 2018-07-31 ENCOUNTER — Ambulatory Visit (INDEPENDENT_AMBULATORY_CARE_PROVIDER_SITE_OTHER): Payer: Medicare Other | Admitting: *Deleted

## 2018-07-31 ENCOUNTER — Other Ambulatory Visit: Payer: Self-pay

## 2018-07-31 DIAGNOSIS — I5022 Chronic systolic (congestive) heart failure: Secondary | ICD-10-CM

## 2018-07-31 DIAGNOSIS — I428 Other cardiomyopathies: Secondary | ICD-10-CM

## 2018-07-31 LAB — CUP PACEART REMOTE DEVICE CHECK
Battery Remaining Longevity: 37 mo
Battery Voltage: 2.96 V
Brady Statistic AP VP Percent: 48.86 %
Brady Statistic AP VS Percent: 0.05 %
Brady Statistic AS VP Percent: 50.91 %
Brady Statistic AS VS Percent: 0.18 %
Brady Statistic RA Percent Paced: 48.67 %
Brady Statistic RV Percent Paced: 99.24 %
Date Time Interrogation Session: 20200427052303
HighPow Impedance: 51 Ohm
HighPow Impedance: 66 Ohm
Implantable Lead Implant Date: 20060323
Implantable Lead Implant Date: 20060323
Implantable Lead Implant Date: 20110420
Implantable Lead Location: 753858
Implantable Lead Location: 753859
Implantable Lead Location: 753860
Implantable Lead Model: 4193
Implantable Lead Model: 5076
Implantable Lead Model: 7121
Implantable Pulse Generator Implant Date: 20160613
Lead Channel Impedance Value: 342 Ohm
Lead Channel Impedance Value: 4047 Ohm
Lead Channel Impedance Value: 4047 Ohm
Lead Channel Impedance Value: 475 Ohm
Lead Channel Impedance Value: 475 Ohm
Lead Channel Impedance Value: 532 Ohm
Lead Channel Pacing Threshold Amplitude: 0.625 V
Lead Channel Pacing Threshold Amplitude: 1.25 V
Lead Channel Pacing Threshold Amplitude: 1.25 V
Lead Channel Pacing Threshold Pulse Width: 0.4 ms
Lead Channel Pacing Threshold Pulse Width: 0.4 ms
Lead Channel Pacing Threshold Pulse Width: 0.6 ms
Lead Channel Sensing Intrinsic Amplitude: 17.625 mV
Lead Channel Sensing Intrinsic Amplitude: 17.625 mV
Lead Channel Sensing Intrinsic Amplitude: 3.125 mV
Lead Channel Sensing Intrinsic Amplitude: 3.125 mV
Lead Channel Setting Pacing Amplitude: 1.5 V
Lead Channel Setting Pacing Amplitude: 1.75 V
Lead Channel Setting Pacing Amplitude: 2.5 V
Lead Channel Setting Pacing Pulse Width: 0.4 ms
Lead Channel Setting Pacing Pulse Width: 0.6 ms
Lead Channel Setting Sensing Sensitivity: 0.6 mV

## 2018-08-06 ENCOUNTER — Other Ambulatory Visit: Payer: Self-pay | Admitting: Internal Medicine

## 2018-08-09 NOTE — Progress Notes (Signed)
Remote ICD transmission.   

## 2018-08-11 ENCOUNTER — Telehealth: Payer: Self-pay

## 2018-08-11 NOTE — Telephone Encounter (Signed)
I called and spoke with patients wife Mattie. She will have patient call back about switching over office visit on 08/17/18 to a telehealth visit.

## 2018-08-14 ENCOUNTER — Other Ambulatory Visit: Payer: Self-pay | Admitting: Internal Medicine

## 2018-08-14 NOTE — Telephone Encounter (Signed)
New Message  Patient would like to talk to you about appt please give him a call.

## 2018-08-14 NOTE — Telephone Encounter (Signed)
I called patient back, he states that he does not want to do a telehealth visit. He will wait to come into the office, appointment cancelled and recall entered.

## 2018-08-15 DIAGNOSIS — H25043 Posterior subcapsular polar age-related cataract, bilateral: Secondary | ICD-10-CM | POA: Diagnosis not present

## 2018-08-15 DIAGNOSIS — E119 Type 2 diabetes mellitus without complications: Secondary | ICD-10-CM | POA: Diagnosis not present

## 2018-08-15 DIAGNOSIS — H25013 Cortical age-related cataract, bilateral: Secondary | ICD-10-CM | POA: Diagnosis not present

## 2018-08-15 DIAGNOSIS — H35033 Hypertensive retinopathy, bilateral: Secondary | ICD-10-CM | POA: Diagnosis not present

## 2018-08-17 ENCOUNTER — Telehealth: Payer: Medicare Other | Admitting: Internal Medicine

## 2018-09-11 DIAGNOSIS — R300 Dysuria: Secondary | ICD-10-CM | POA: Diagnosis not present

## 2018-09-11 DIAGNOSIS — N3000 Acute cystitis without hematuria: Secondary | ICD-10-CM | POA: Diagnosis not present

## 2018-09-21 DIAGNOSIS — H9311 Tinnitus, right ear: Secondary | ICD-10-CM | POA: Diagnosis not present

## 2018-09-21 DIAGNOSIS — H918X1 Other specified hearing loss, right ear: Secondary | ICD-10-CM | POA: Diagnosis not present

## 2018-09-21 DIAGNOSIS — R2689 Other abnormalities of gait and mobility: Secondary | ICD-10-CM | POA: Diagnosis not present

## 2018-09-21 DIAGNOSIS — R42 Dizziness and giddiness: Secondary | ICD-10-CM | POA: Diagnosis not present

## 2018-09-21 DIAGNOSIS — H9041 Sensorineural hearing loss, unilateral, right ear, with unrestricted hearing on the contralateral side: Secondary | ICD-10-CM | POA: Diagnosis not present

## 2018-09-28 DIAGNOSIS — D1801 Hemangioma of skin and subcutaneous tissue: Secondary | ICD-10-CM | POA: Diagnosis not present

## 2018-09-28 DIAGNOSIS — L570 Actinic keratosis: Secondary | ICD-10-CM | POA: Diagnosis not present

## 2018-09-28 DIAGNOSIS — L812 Freckles: Secondary | ICD-10-CM | POA: Diagnosis not present

## 2018-09-28 DIAGNOSIS — Z85828 Personal history of other malignant neoplasm of skin: Secondary | ICD-10-CM | POA: Diagnosis not present

## 2018-09-28 DIAGNOSIS — L821 Other seborrheic keratosis: Secondary | ICD-10-CM | POA: Diagnosis not present

## 2018-09-28 DIAGNOSIS — C44619 Basal cell carcinoma of skin of left upper limb, including shoulder: Secondary | ICD-10-CM | POA: Diagnosis not present

## 2018-09-28 DIAGNOSIS — D485 Neoplasm of uncertain behavior of skin: Secondary | ICD-10-CM | POA: Diagnosis not present

## 2018-10-02 ENCOUNTER — Other Ambulatory Visit: Payer: Self-pay | Admitting: Internal Medicine

## 2018-10-17 ENCOUNTER — Telehealth: Payer: Self-pay

## 2018-10-17 NOTE — Telephone Encounter (Signed)
Pt states he have not felt his ICD and wanted to know if his ICD was still on. I told reassured him that his ICD is still on. He would have to have a doctor order to have his ICD turned off. I tried to get him to send in a manual transmission just to give him peace of mind but his monitor gave the error code 3248. I gave him the number to [facility] support to get additional help. I advised him to call early in the morning to get thru to [facility]. Pt verbalized understanding.

## 2018-10-26 DIAGNOSIS — R972 Elevated prostate specific antigen [PSA]: Secondary | ICD-10-CM | POA: Diagnosis not present

## 2018-10-26 DIAGNOSIS — R300 Dysuria: Secondary | ICD-10-CM | POA: Diagnosis not present

## 2018-10-26 DIAGNOSIS — N402 Nodular prostate without lower urinary tract symptoms: Secondary | ICD-10-CM | POA: Diagnosis not present

## 2018-10-27 ENCOUNTER — Telehealth: Payer: Self-pay

## 2018-10-27 NOTE — Telephone Encounter (Signed)
The pt received his new monitor. I had him send a manual transmission to pair with his device. I let the pt know he will only be charged for the one on Monday.

## 2018-10-30 ENCOUNTER — Ambulatory Visit (INDEPENDENT_AMBULATORY_CARE_PROVIDER_SITE_OTHER): Payer: Medicare Other | Admitting: *Deleted

## 2018-10-30 DIAGNOSIS — I428 Other cardiomyopathies: Secondary | ICD-10-CM | POA: Diagnosis not present

## 2018-10-30 DIAGNOSIS — I5022 Chronic systolic (congestive) heart failure: Secondary | ICD-10-CM

## 2018-10-30 LAB — CUP PACEART REMOTE DEVICE CHECK
Battery Remaining Longevity: 33 mo
Battery Voltage: 2.96 V
Brady Statistic AP VP Percent: 31.31 %
Brady Statistic AP VS Percent: 0.06 %
Brady Statistic AS VP Percent: 68.46 %
Brady Statistic AS VS Percent: 0.16 %
Brady Statistic RA Percent Paced: 31.15 %
Brady Statistic RV Percent Paced: 99.1 %
Date Time Interrogation Session: 20200727052504
HighPow Impedance: 57 Ohm
HighPow Impedance: 71 Ohm
Implantable Lead Implant Date: 20060323
Implantable Lead Implant Date: 20060323
Implantable Lead Implant Date: 20110420
Implantable Lead Location: 753858
Implantable Lead Location: 753859
Implantable Lead Location: 753860
Implantable Lead Model: 4193
Implantable Lead Model: 5076
Implantable Lead Model: 7121
Implantable Pulse Generator Implant Date: 20160613
Lead Channel Impedance Value: 342 Ohm
Lead Channel Impedance Value: 4047 Ohm
Lead Channel Impedance Value: 4047 Ohm
Lead Channel Impedance Value: 475 Ohm
Lead Channel Impedance Value: 513 Ohm
Lead Channel Impedance Value: 513 Ohm
Lead Channel Pacing Threshold Amplitude: 0.625 V
Lead Channel Pacing Threshold Amplitude: 1 V
Lead Channel Pacing Threshold Amplitude: 1.25 V
Lead Channel Pacing Threshold Pulse Width: 0.4 ms
Lead Channel Pacing Threshold Pulse Width: 0.4 ms
Lead Channel Pacing Threshold Pulse Width: 0.6 ms
Lead Channel Sensing Intrinsic Amplitude: 22.625 mV
Lead Channel Sensing Intrinsic Amplitude: 22.625 mV
Lead Channel Sensing Intrinsic Amplitude: 3.375 mV
Lead Channel Sensing Intrinsic Amplitude: 3.375 mV
Lead Channel Setting Pacing Amplitude: 1.5 V
Lead Channel Setting Pacing Amplitude: 1.75 V
Lead Channel Setting Pacing Amplitude: 2.5 V
Lead Channel Setting Pacing Pulse Width: 0.4 ms
Lead Channel Setting Pacing Pulse Width: 0.6 ms
Lead Channel Setting Sensing Sensitivity: 0.6 mV

## 2018-10-31 ENCOUNTER — Other Ambulatory Visit: Payer: Self-pay | Admitting: Internal Medicine

## 2018-11-05 ENCOUNTER — Other Ambulatory Visit: Payer: Self-pay | Admitting: Internal Medicine

## 2018-11-06 DIAGNOSIS — R42 Dizziness and giddiness: Secondary | ICD-10-CM | POA: Diagnosis not present

## 2018-11-06 DIAGNOSIS — H9311 Tinnitus, right ear: Secondary | ICD-10-CM | POA: Diagnosis not present

## 2018-11-06 DIAGNOSIS — H918X1 Other specified hearing loss, right ear: Secondary | ICD-10-CM | POA: Diagnosis not present

## 2018-11-11 ENCOUNTER — Other Ambulatory Visit: Payer: Self-pay | Admitting: Internal Medicine

## 2018-11-17 ENCOUNTER — Encounter: Payer: Self-pay | Admitting: Cardiology

## 2018-11-17 NOTE — Progress Notes (Signed)
Remote ICD transmission.   

## 2018-11-27 DIAGNOSIS — E782 Mixed hyperlipidemia: Secondary | ICD-10-CM | POA: Diagnosis not present

## 2018-11-27 DIAGNOSIS — E1122 Type 2 diabetes mellitus with diabetic chronic kidney disease: Secondary | ICD-10-CM | POA: Diagnosis not present

## 2018-11-27 DIAGNOSIS — Z7984 Long term (current) use of oral hypoglycemic drugs: Secondary | ICD-10-CM | POA: Diagnosis not present

## 2018-11-27 DIAGNOSIS — I130 Hypertensive heart and chronic kidney disease with heart failure and stage 1 through stage 4 chronic kidney disease, or unspecified chronic kidney disease: Secondary | ICD-10-CM | POA: Diagnosis not present

## 2018-11-29 DIAGNOSIS — I130 Hypertensive heart and chronic kidney disease with heart failure and stage 1 through stage 4 chronic kidney disease, or unspecified chronic kidney disease: Secondary | ICD-10-CM | POA: Diagnosis not present

## 2018-11-29 DIAGNOSIS — I5022 Chronic systolic (congestive) heart failure: Secondary | ICD-10-CM | POA: Diagnosis not present

## 2018-11-29 DIAGNOSIS — Z7984 Long term (current) use of oral hypoglycemic drugs: Secondary | ICD-10-CM | POA: Diagnosis not present

## 2018-11-29 DIAGNOSIS — Z6837 Body mass index (BMI) 37.0-37.9, adult: Secondary | ICD-10-CM | POA: Diagnosis not present

## 2018-11-29 DIAGNOSIS — E782 Mixed hyperlipidemia: Secondary | ICD-10-CM | POA: Diagnosis not present

## 2018-11-29 DIAGNOSIS — E1122 Type 2 diabetes mellitus with diabetic chronic kidney disease: Secondary | ICD-10-CM | POA: Diagnosis not present

## 2018-11-29 DIAGNOSIS — Z7189 Other specified counseling: Secondary | ICD-10-CM | POA: Diagnosis not present

## 2018-12-08 DIAGNOSIS — H6123 Impacted cerumen, bilateral: Secondary | ICD-10-CM | POA: Diagnosis not present

## 2018-12-27 ENCOUNTER — Ambulatory Visit (INDEPENDENT_AMBULATORY_CARE_PROVIDER_SITE_OTHER): Payer: Medicare Other | Admitting: Internal Medicine

## 2018-12-27 ENCOUNTER — Encounter: Payer: Self-pay | Admitting: Internal Medicine

## 2018-12-27 ENCOUNTER — Other Ambulatory Visit: Payer: Self-pay

## 2018-12-27 VITALS — BP 116/76 | HR 70 | Ht 70.0 in | Wt 255.2 lb

## 2018-12-27 DIAGNOSIS — I5042 Chronic combined systolic (congestive) and diastolic (congestive) heart failure: Secondary | ICD-10-CM | POA: Diagnosis not present

## 2018-12-27 DIAGNOSIS — I428 Other cardiomyopathies: Secondary | ICD-10-CM | POA: Diagnosis not present

## 2018-12-27 DIAGNOSIS — Z79899 Other long term (current) drug therapy: Secondary | ICD-10-CM

## 2018-12-27 DIAGNOSIS — Z95810 Presence of automatic (implantable) cardiac defibrillator: Secondary | ICD-10-CM | POA: Diagnosis not present

## 2018-12-27 NOTE — Patient Instructions (Addendum)
Medication Instructions:  Your physician recommends that you continue on your current medications as directed. Please refer to the Current Medication list given to you today.  *If you need a refill on your cardiac medications before your next appointment, please call your pharmacy*  Labwork: Today: BMET If you have labs (blood work) drawn today and your tests are completely normal, you will receive your results only by:  [place] (if you have MyChart) OR  A paper copy in the mail If you have any lab test that is abnormal or we need to change your treatment, we will call you to review the results.  Testing/Procedures: None ordered  Follow-Up: Remote monitoring is used to monitor your Pacemaker or ICD from home. This monitoring reduces the number of office visits required to check your device to one time per year. It allows [place] to keep an eye on the functioning of your device to ensure it is working properly. You are scheduled for a device check from home on 01/30/19. You may send your transmission at any time that day. If you have a wireless device, the transmission will be sent automatically. After your physician reviews your transmission, you will receive a postcard with your next transmission date.  Your physician wants you to follow-up in: 1 year with Dr. [name].  You will receive a reminder letter in the mail two months in advance. If you don't receive a letter, please call our office to schedule the follow-up appointment.  Thank you for choosing [facility]!!    Any Other Special Instructions Will Be Listed Below (If Applicable).

## 2018-12-27 NOTE — Progress Notes (Signed)
Patient Care Team: [name], MD as PCP - General (Family Medicine)   HPI  Russell Fuentes is a 68 y.o. male Seen in followup for an ICD to CRT implanted for nonischemic cardiomyopathy;  he underwent generator replacement 6/16  The patient denies chest pain, nocturnal dyspnea, orthopnea or peripheral edema.  There have been no palpitations, lightheadedness or syncope.   Shortness of breath with moderate exertion.    His weight has gone up.  Date Cr K Hgb  6/19 1.17 4.3 15.5  2/20 1.33 4.3       DATE TEST EF   11/14 Echo   55-65 %   9/15 LHC  No obstructive CAD  5/18 Echo   35 %   5/18 Echo  45  AV/VV optimization  8/19 Echo  35-40%       Past Medical History:  Diagnosis Date  . Chronic combined systolic and diastolic heart failure ([place])    . Diabetes mellitus with neuropathy ([place])    . Nonischemic cardiomyopathy ([place])     a. s/p MDT CRTD  . Orthostatic lightheadedness      Past Surgical History:  Procedure Laterality Date  . CARDIAC CATHETERIZATION  12/31/1998   Bi-plane cine left ventriculography revealed low-normal left ventricular function with an ejection fraction in the 45% to 50% range.  . CORONARY ARTERY BYPASS GRAFT    . DOPPLER ECHOCARDIOGRAPHY  04/28/2011   LVEF 41% by simpson's borderline concentric LVH, global hypokinesis with regional variation. Stage 1 diastolic dysfunction, normal LV filling pressure. Normal LA size. Right heart pacing wires noted. Trace MR, TR. Normal RVSP  . EP IMPLANTABLE DEVICE N/A 09/16/2014   Procedure: ICD Generator Changeout;  Surgeon: [name], MD;  Location: [facility];  Service: Cardiovascular;  Laterality: N/A;  . LEFT HEART CATHETERIZATION WITH CORONARY ANGIOGRAM N/A 12/11/2013   Procedure: LEFT HEART CATHETERIZATION WITH CORONARY ANGIOGRAM;  Surgeon: [name], MD;  Location: [facility] CATH LAB;  Service: Cardiovascular;  Laterality: N/A;  . NM MYOCAR PERF EJECTION FRACTION  04/03/2009, 11/18/2005    The post stress myocardial perfusion images show a normal pattern of perfusion in all regions. The post-stress ejection fraction is 50%. No significant wall motion abnormalities noted. This is a low risk scan.    Current Outpatient Medications  Medication Sig Dispense Refill  . carvedilol (COREG) 25 MG tablet TAKE 1 TABLET BY MOUTH TWICE DAILY WITH A MEAL 180 tablet 0  . glimepiride (AMARYL) 4 MG tablet Take 4 mg by mouth daily with breakfast.    . LOVASTATIN PO Take 20 mg by mouth daily.    [name] spironolactone (ALDACTONE) 25 MG tablet Take 0.5 tablets (12.5 mg total) by mouth daily. Please keep upcoming appt in September for future refills. Thank you 45 tablet 0  . valsartan (DIOVAN) 80 MG tablet Take 1 tablet (80 mg total) by mouth daily. Please keep upcoming appt for future refills. Thank you 90 tablet 0   No current facility-administered medications for this visit.     Allergies  Allergen Reactions  . Entresto [Sacubitril-Valsartan]     Fatigue, joint pain. Ok on valsartan alone  . Codeine Rash    Review of Systems negative except from HPI and PMH  Physical Exam BP 116/76   Pulse 70   Ht 5\' 10"  (1.778 m)   Wt 255 lb 3.2 oz (115.8 kg)   SpO2 97%   BMI 36.62 kg/m  Well developed and well  nourished in no acute distress HENT normal Neck supple with JVP-flat Clear Device pocket well healed; without hematoma or erythema.  There is no tethering  Regular rate and rhythm, no  gallop No murmur Abd-soft with active BS No Clubbing cyanosis  edema Skin-warm and dry A & Oriented  Grossly normal sensory and motor function  ECG sinus rhythm at 70 P synchronous pacing QRS negative lead  1 RS lead V1    Assessment and  Plan  Nonischemic cardiomyopathy-   Congestive heart failure-chronic-systolic  Diabetes  Implantable defibrillator-Medtronic- CRT The patient's device was interrogated.  The information was reviewed. No changes were made in the programming.     High Risk  Medication Surveillance Aldactone    Struggling to lose weight likely contributing in a major way to his dyspnea.  Euvolemic continue current meds  Check surveillance labss

## 2018-12-28 LAB — BASIC METABOLIC PANEL
BUN/Creatinine Ratio: 13 (ref 10–24)
BUN: 17 mg/dL (ref 8–27)
CO2: 23 mmol/L (ref 20–29)
Calcium: 9.2 mg/dL (ref 8.6–10.2)
Chloride: 103 mmol/L (ref 96–106)
Creatinine, Ser: 1.28 mg/dL — ABNORMAL HIGH (ref 0.76–1.27)
GFR calc Af Amer: 66 mL/min/{1.73_m2} (ref >59)
GFR calc non Af Amer: 57 mL/min/{1.73_m2} — ABNORMAL LOW (ref >59)
Glucose: 191 mg/dL — ABNORMAL HIGH (ref 65–99)
Potassium: 4.8 mmol/L (ref 3.5–5.2)
Sodium: 140 mmol/L (ref 134–144)

## 2018-12-29 LAB — CUP PACEART INCLINIC DEVICE CHECK
Battery Remaining Longevity: 30 mo
Battery Voltage: 2.94 V
Brady Statistic AP VP Percent: 44.57 %
Brady Statistic AP VS Percent: 0.06 %
Brady Statistic AS VP Percent: 55.21 %
Brady Statistic AS VS Percent: 0.16 %
Brady Statistic RA Percent Paced: 44.39 %
Brady Statistic RV Percent Paced: 99.15 %
Date Time Interrogation Session: 20200923191642
HighPow Impedance: 55 Ohm
HighPow Impedance: 74 Ohm
Implantable Lead Implant Date: 20060323
Implantable Lead Implant Date: 20060323
Implantable Lead Implant Date: 20110420
Implantable Lead Location: 753858
Implantable Lead Location: 753859
Implantable Lead Location: 753860
Implantable Lead Model: 4193
Implantable Lead Model: 5076
Implantable Lead Model: 7121
Implantable Pulse Generator Implant Date: 20160613
Lead Channel Impedance Value: 399 Ohm
Lead Channel Impedance Value: 4047 Ohm
Lead Channel Impedance Value: 4047 Ohm
Lead Channel Impedance Value: 475 Ohm
Lead Channel Impedance Value: 513 Ohm
Lead Channel Impedance Value: 532 Ohm
Lead Channel Pacing Threshold Amplitude: 0.5 V
Lead Channel Pacing Threshold Amplitude: 1.25 V
Lead Channel Pacing Threshold Amplitude: 1.75 V
Lead Channel Pacing Threshold Pulse Width: 0.4 ms
Lead Channel Pacing Threshold Pulse Width: 0.4 ms
Lead Channel Pacing Threshold Pulse Width: 0.6 ms
Lead Channel Sensing Intrinsic Amplitude: 24.625 mV
Lead Channel Sensing Intrinsic Amplitude: 4 mV
Lead Channel Setting Pacing Amplitude: 1.5 V
Lead Channel Setting Pacing Amplitude: 2.5 V
Lead Channel Setting Pacing Amplitude: 2.5 V
Lead Channel Setting Pacing Pulse Width: 0.4 ms
Lead Channel Setting Pacing Pulse Width: 0.6 ms
Lead Channel Setting Sensing Sensitivity: 0.6 mV

## 2019-01-06 DIAGNOSIS — Z23 Encounter for immunization: Secondary | ICD-10-CM | POA: Diagnosis not present

## 2019-01-24 DIAGNOSIS — E119 Type 2 diabetes mellitus without complications: Secondary | ICD-10-CM | POA: Diagnosis not present

## 2019-01-24 DIAGNOSIS — H25813 Combined forms of age-related cataract, bilateral: Secondary | ICD-10-CM | POA: Diagnosis not present

## 2019-01-30 ENCOUNTER — Ambulatory Visit (INDEPENDENT_AMBULATORY_CARE_PROVIDER_SITE_OTHER): Payer: Medicare Other | Admitting: *Deleted

## 2019-01-30 DIAGNOSIS — I428 Other cardiomyopathies: Secondary | ICD-10-CM | POA: Diagnosis not present

## 2019-01-30 DIAGNOSIS — I5042 Chronic combined systolic (congestive) and diastolic (congestive) heart failure: Secondary | ICD-10-CM

## 2019-01-30 LAB — CUP PACEART REMOTE DEVICE CHECK
Battery Remaining Longevity: 27 mo
Battery Voltage: 2.95 V
Brady Statistic AP VP Percent: 42.11 %
Brady Statistic AP VS Percent: 0.06 %
Brady Statistic AS VP Percent: 57.55 %
Brady Statistic AS VS Percent: 0.28 %
Brady Statistic RA Percent Paced: 41.84 %
Brady Statistic RV Percent Paced: 98.78 %
Date Time Interrogation Session: 20201027084223
HighPow Impedance: 50 Ohm
HighPow Impedance: 60 Ohm
Implantable Lead Implant Date: 20060323
Implantable Lead Implant Date: 20060323
Implantable Lead Implant Date: 20110420
Implantable Lead Location: 753858
Implantable Lead Location: 753859
Implantable Lead Location: 753860
Implantable Lead Model: 4193
Implantable Lead Model: 5076
Implantable Lead Model: 7121
Implantable Pulse Generator Implant Date: 20160613
Lead Channel Impedance Value: 285 Ohm
Lead Channel Impedance Value: 399 Ohm
Lead Channel Impedance Value: 4047 Ohm
Lead Channel Impedance Value: 4047 Ohm
Lead Channel Impedance Value: 418 Ohm
Lead Channel Impedance Value: 532 Ohm
Lead Channel Pacing Threshold Amplitude: 0.625 V
Lead Channel Pacing Threshold Amplitude: 1.25 V
Lead Channel Pacing Threshold Amplitude: 2.5 V
Lead Channel Pacing Threshold Pulse Width: 0.4 ms
Lead Channel Pacing Threshold Pulse Width: 0.4 ms
Lead Channel Pacing Threshold Pulse Width: 0.6 ms
Lead Channel Sensing Intrinsic Amplitude: 25.75 mV
Lead Channel Sensing Intrinsic Amplitude: 25.75 mV
Lead Channel Sensing Intrinsic Amplitude: 3.625 mV
Lead Channel Sensing Intrinsic Amplitude: 3.625 mV
Lead Channel Setting Pacing Amplitude: 1.5 V
Lead Channel Setting Pacing Amplitude: 2.5 V
Lead Channel Setting Pacing Amplitude: 3 V
Lead Channel Setting Pacing Pulse Width: 0.4 ms
Lead Channel Setting Pacing Pulse Width: 0.6 ms
Lead Channel Setting Sensing Sensitivity: 0.6 mV

## 2019-02-05 ENCOUNTER — Other Ambulatory Visit: Payer: Self-pay | Admitting: Internal Medicine

## 2019-02-05 MED ORDER — CARVEDILOL 25 MG PO TABS: ORAL_TABLET | ORAL | 2 refills | Status: DC

## 2019-02-05 MED ORDER — VALSARTAN 80 MG PO TABS: 80.0000 mg | ORAL_TABLET | Freq: Every day | ORAL | 2 refills | Status: DC

## 2019-02-05 MED ORDER — SPIRONOLACTONE 25 MG PO TABS: 12.5000 mg | ORAL_TABLET | Freq: Every day | ORAL | 3 refills | Status: DC

## 2019-02-05 NOTE — Addendum Note (Signed)
Addended by: Derl Barrow on: 02/05/2019 01:29 PM   Modules accepted: Orders

## 2019-02-14 DIAGNOSIS — R972 Elevated prostate specific antigen [PSA]: Secondary | ICD-10-CM | POA: Diagnosis not present

## 2019-02-14 DIAGNOSIS — N3000 Acute cystitis without hematuria: Secondary | ICD-10-CM | POA: Diagnosis not present

## 2019-02-21 NOTE — Progress Notes (Signed)
Remote ICD transmission.   

## 2019-04-26 DIAGNOSIS — H2511 Age-related nuclear cataract, right eye: Secondary | ICD-10-CM | POA: Diagnosis not present

## 2019-05-01 ENCOUNTER — Ambulatory Visit (INDEPENDENT_AMBULATORY_CARE_PROVIDER_SITE_OTHER): Payer: Medicare Other | Admitting: *Deleted

## 2019-05-01 DIAGNOSIS — I428 Other cardiomyopathies: Secondary | ICD-10-CM

## 2019-05-02 LAB — CUP PACEART REMOTE DEVICE CHECK
Battery Remaining Longevity: 25 mo
Battery Voltage: 2.94 V
Brady Statistic AP VP Percent: 48.26 %
Brady Statistic AP VS Percent: 0.06 %
Brady Statistic AS VP Percent: 51.56 %
Brady Statistic AS VS Percent: 0.12 %
Brady Statistic RA Percent Paced: 47.96 %
Brady Statistic RV Percent Paced: 99.02 %
Date Time Interrogation Session: 20210126031604
HighPow Impedance: 51 Ohm
HighPow Impedance: 63 Ohm
Implantable Lead Implant Date: 20060323
Implantable Lead Implant Date: 20060323
Implantable Lead Implant Date: 20110420
Implantable Lead Location: 753858
Implantable Lead Location: 753859
Implantable Lead Location: 753860
Implantable Lead Model: 4193
Implantable Lead Model: 5076
Implantable Lead Model: 7121
Implantable Pulse Generator Implant Date: 20160613
Lead Channel Impedance Value: 304 Ohm
Lead Channel Impedance Value: 4047 Ohm
Lead Channel Impedance Value: 4047 Ohm
Lead Channel Impedance Value: 418 Ohm
Lead Channel Impedance Value: 418 Ohm
Lead Channel Impedance Value: 532 Ohm
Lead Channel Pacing Threshold Amplitude: 0.625 V
Lead Channel Pacing Threshold Amplitude: 0.875 V
Lead Channel Pacing Threshold Amplitude: 1 V
Lead Channel Pacing Threshold Pulse Width: 0.4 ms
Lead Channel Pacing Threshold Pulse Width: 0.4 ms
Lead Channel Pacing Threshold Pulse Width: 0.6 ms
Lead Channel Sensing Intrinsic Amplitude: 28.75 mV
Lead Channel Sensing Intrinsic Amplitude: 28.75 mV
Lead Channel Sensing Intrinsic Amplitude: 3.125 mV
Lead Channel Sensing Intrinsic Amplitude: 3.125 mV
Lead Channel Setting Pacing Amplitude: 1.5 V
Lead Channel Setting Pacing Amplitude: 1.5 V
Lead Channel Setting Pacing Amplitude: 2.25 V
Lead Channel Setting Pacing Pulse Width: 0.4 ms
Lead Channel Setting Pacing Pulse Width: 0.6 ms
Lead Channel Setting Sensing Sensitivity: 0.6 mV

## 2019-05-13 DIAGNOSIS — H2512 Age-related nuclear cataract, left eye: Secondary | ICD-10-CM | POA: Diagnosis not present

## 2019-05-15 DIAGNOSIS — E782 Mixed hyperlipidemia: Secondary | ICD-10-CM | POA: Diagnosis not present

## 2019-05-15 DIAGNOSIS — R8290 Unspecified abnormal findings in urine: Secondary | ICD-10-CM | POA: Diagnosis not present

## 2019-05-15 DIAGNOSIS — I5022 Chronic systolic (congestive) heart failure: Secondary | ICD-10-CM | POA: Diagnosis not present

## 2019-05-15 DIAGNOSIS — N183 Chronic kidney disease, stage 3 unspecified: Secondary | ICD-10-CM | POA: Diagnosis not present

## 2019-05-15 DIAGNOSIS — E1122 Type 2 diabetes mellitus with diabetic chronic kidney disease: Secondary | ICD-10-CM | POA: Diagnosis not present

## 2019-05-15 DIAGNOSIS — I130 Hypertensive heart and chronic kidney disease with heart failure and stage 1 through stage 4 chronic kidney disease, or unspecified chronic kidney disease: Secondary | ICD-10-CM | POA: Diagnosis not present

## 2019-05-17 DIAGNOSIS — H2512 Age-related nuclear cataract, left eye: Secondary | ICD-10-CM | POA: Diagnosis not present

## 2019-07-31 ENCOUNTER — Ambulatory Visit (INDEPENDENT_AMBULATORY_CARE_PROVIDER_SITE_OTHER): Payer: Medicare Other | Admitting: *Deleted

## 2019-07-31 DIAGNOSIS — I428 Other cardiomyopathies: Secondary | ICD-10-CM | POA: Diagnosis not present

## 2019-07-31 LAB — CUP PACEART REMOTE DEVICE CHECK
Battery Remaining Longevity: 22 mo
Battery Voltage: 2.92 V
Brady Statistic AP VP Percent: 46.45 %
Brady Statistic AP VS Percent: 0.05 %
Brady Statistic AS VP Percent: 53.33 %
Brady Statistic AS VS Percent: 0.17 %
Brady Statistic RA Percent Paced: 45.9 %
Brady Statistic RV Percent Paced: 98.21 %
Date Time Interrogation Session: 20210427001802
HighPow Impedance: 50 Ohm
HighPow Impedance: 65 Ohm
Implantable Lead Implant Date: 20060323
Implantable Lead Implant Date: 20060323
Implantable Lead Implant Date: 20110420
Implantable Lead Location: 753858
Implantable Lead Location: 753859
Implantable Lead Location: 753860
Implantable Lead Model: 4193
Implantable Lead Model: 5076
Implantable Lead Model: 7121
Implantable Pulse Generator Implant Date: 20160613
Lead Channel Impedance Value: 285 Ohm
Lead Channel Impedance Value: 4047 Ohm
Lead Channel Impedance Value: 4047 Ohm
Lead Channel Impedance Value: 418 Ohm
Lead Channel Impedance Value: 418 Ohm
Lead Channel Impedance Value: 551 Ohm
Lead Channel Pacing Threshold Amplitude: 0.625 V
Lead Channel Pacing Threshold Amplitude: 0.875 V
Lead Channel Pacing Threshold Amplitude: 2.75 V
Lead Channel Pacing Threshold Pulse Width: 0.4 ms
Lead Channel Pacing Threshold Pulse Width: 0.4 ms
Lead Channel Pacing Threshold Pulse Width: 0.6 ms
Lead Channel Sensing Intrinsic Amplitude: 27.25 mV
Lead Channel Sensing Intrinsic Amplitude: 27.25 mV
Lead Channel Sensing Intrinsic Amplitude: 4.5 mV
Lead Channel Sensing Intrinsic Amplitude: 4.5 mV
Lead Channel Setting Pacing Amplitude: 1.5 V
Lead Channel Setting Pacing Amplitude: 2.25 V
Lead Channel Setting Pacing Amplitude: 3.5 V
Lead Channel Setting Pacing Pulse Width: 0.4 ms
Lead Channel Setting Pacing Pulse Width: 0.6 ms
Lead Channel Setting Sensing Sensitivity: 0.6 mV

## 2019-08-01 ENCOUNTER — Telehealth: Payer: Self-pay

## 2019-08-01 NOTE — Progress Notes (Signed)
ICD Remote  

## 2019-08-01 NOTE — Telephone Encounter (Signed)
Leigh  lets connect him with Sharman Cheek and next week we can talk about the changing of the slope Thanks SK

## 2019-08-01 NOTE — Telephone Encounter (Signed)
Received [facility] alert 07/31/19 for increased Optivol/ TI below baseline since 06/09/19. Called patient to assess, patient reports of increased swelling in lower legs and productive cough in the mornings. Denies chest pain, shortness of breath or any other symptoms. Patient is compliant with all medications including Aldactone 12.5 mg daily. 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 Advised patient I would forward this information to Dr. [name].  Patient states he would interested in enrollment in [facility] clinic. Advised if he have any further questions or concerns to please call DC at [phone]. Verbalizes understanding.

## 2019-08-03 NOTE — Telephone Encounter (Signed)
ICM intro call to patient.  Explained Dr [name] requested I follow up with him regarding recent fluid levels on Optivol report that suggested possible fluid accumulation.  Requested patient send remote transmission on Tuesday morning on 5/6 to recheck fluid levels.  He does not need assistance with sending remote transmission.  He stated his granddaughter is being discharged from [facility] hospital due to brain tumor surgery.  She has a follow up appointment on Monday to get the pathologist report.  He reports occasional swelling of feet but other wise is without complaints. Patient was driving so will discuss monthly ICM follow up at next call.

## 2019-08-07 ENCOUNTER — Ambulatory Visit (INDEPENDENT_AMBULATORY_CARE_PROVIDER_SITE_OTHER): Payer: Medicare Other

## 2019-08-07 DIAGNOSIS — Z95810 Presence of automatic (implantable) cardiac defibrillator: Secondary | ICD-10-CM

## 2019-08-07 DIAGNOSIS — I5042 Chronic combined systolic (congestive) and diastolic (congestive) heart failure: Secondary | ICD-10-CM

## 2019-08-07 NOTE — Progress Notes (Signed)
EPIC Encounter for ICM Monitoring  Patient Name: Russell Fuentes is a 69 y.o. male Date: 08/07/2019 Primary Care Physican: [name], MD Primary Cardiologist: [name] Electrophysiologist: Vergie Living Pacing: 97.1%  08/07/2019 Weight: 250 lbs        1st ICM remote transmission. Heart Failure questions reviewed.  Pt symptomatic with minimal shortness of breath but he is not sure if it is more related to allergies.  He reports limiting fluid intake to 64 oz daily.   He does not limit salt intake and typically eats restaurant foods for lunch and wife cooks at night.  Discussed effects of restaurant foods and encouraged to avoid due to high salt.    Optivol Thoracic impedance suggesting possible fluid accumulation starting 05/29/19.  Prescribed: Spironolactone 25 mg take 0.5 tablet (12.5 mg total) by mouth daily  Labs: 12/27/2018 Creatinine 1.28, BUN 17, Potassium 4.8, Sodium 140, GFR 57-66  Recommendations:  Discussed limiting salt intake to < 2000 mg daily and fluid intake to 64 oz daily.   He requested 2 week recheck to provide him a chance to make diet changes.   Follow-up plan: ICM clinic phone appointment on 08/21/2019 to recheck fluid levels.   91 day device clinic remote transmission 10/30/2019.     Copy of ICM check sent to Dr. [name].   3 month ICM trend: 08/07/2019    1 Year ICM trend:       Rosalene Billings, RN 08/07/2019 8:14 AM

## 2019-08-16 DIAGNOSIS — H9319 Tinnitus, unspecified ear: Secondary | ICD-10-CM | POA: Diagnosis not present

## 2019-08-16 DIAGNOSIS — E782 Mixed hyperlipidemia: Secondary | ICD-10-CM | POA: Diagnosis not present

## 2019-08-16 DIAGNOSIS — I130 Hypertensive heart and chronic kidney disease with heart failure and stage 1 through stage 4 chronic kidney disease, or unspecified chronic kidney disease: Secondary | ICD-10-CM | POA: Diagnosis not present

## 2019-08-16 DIAGNOSIS — Z0000 Encounter for general adult medical examination without abnormal findings: Secondary | ICD-10-CM | POA: Diagnosis not present

## 2019-08-16 DIAGNOSIS — E1122 Type 2 diabetes mellitus with diabetic chronic kidney disease: Secondary | ICD-10-CM | POA: Diagnosis not present

## 2019-08-16 DIAGNOSIS — N1831 Chronic kidney disease, stage 3a: Secondary | ICD-10-CM | POA: Diagnosis not present

## 2019-08-16 DIAGNOSIS — E669 Obesity, unspecified: Secondary | ICD-10-CM | POA: Diagnosis not present

## 2019-08-16 DIAGNOSIS — I429 Cardiomyopathy, unspecified: Secondary | ICD-10-CM | POA: Diagnosis not present

## 2019-08-16 DIAGNOSIS — I5022 Chronic systolic (congestive) heart failure: Secondary | ICD-10-CM | POA: Diagnosis not present

## 2019-08-16 DIAGNOSIS — M545 Low back pain: Secondary | ICD-10-CM | POA: Diagnosis not present

## 2019-08-16 DIAGNOSIS — J309 Allergic rhinitis, unspecified: Secondary | ICD-10-CM | POA: Diagnosis not present

## 2019-08-21 ENCOUNTER — Ambulatory Visit (INDEPENDENT_AMBULATORY_CARE_PROVIDER_SITE_OTHER): Payer: Medicare Other

## 2019-08-21 DIAGNOSIS — I5042 Chronic combined systolic (congestive) and diastolic (congestive) heart failure: Secondary | ICD-10-CM

## 2019-08-21 DIAGNOSIS — Z95810 Presence of automatic (implantable) cardiac defibrillator: Secondary | ICD-10-CM

## 2019-08-22 NOTE — Progress Notes (Signed)
EPIC Encounter for ICM Monitoring  Patient Name: Russell Fuentes is a 69 y.o. male Date: 08/22/2019 Primary Care Physican: [name], MD Primary Cardiologist: [name] Electrophysiologist: Vergie Living Pacing: 97.1%         08/07/2019 Weight: 250 lbs                                                            Spoke with wife per DPR, patient outside mowing.  Transmission reviewed.  She said patient is doing well   Optivol Thoracic impedance returned to normal since 5/4 remote transmission.   Prescribed: Spironolactone 25 mg take 0.5 tablet (12.5 mg total) by mouth daily  Labs: 12/27/2018 Creatinine 1.28, BUN 17, Potassium 4.8, Sodium 140, GFR 57-66  Recommendations:  No changes and encouraged to call if experiencing any fluid symptoms.  Follow-up plan: ICM clinic phone appointment on 09/17/2019.   91 day device clinic remote transmission 10/30/2019.     Copy of ICM check sent to Dr. [name].   3 month ICM trend: 08/21/2019    1 Year ICM trend:       Rosalene Billings, RN 08/22/2019 3:41 PM

## 2019-08-30 DIAGNOSIS — J309 Allergic rhinitis, unspecified: Secondary | ICD-10-CM | POA: Diagnosis not present

## 2019-09-14 ENCOUNTER — Emergency Department (HOSPITAL_COMMUNITY): Payer: Medicare Other

## 2019-09-14 ENCOUNTER — Emergency Department (HOSPITAL_COMMUNITY)
Admission: EM | Admit: 2019-09-14 | Discharge: 2019-09-14 | Disposition: A | Discharge status: Home / Self Care | Payer: Medicare Other | Attending: Emergency Medicine | Admitting: Emergency Medicine

## 2019-09-14 ENCOUNTER — Other Ambulatory Visit: Payer: Self-pay

## 2019-09-14 ENCOUNTER — Encounter (HOSPITAL_COMMUNITY): Payer: Self-pay | Admitting: Emergency Medicine

## 2019-09-14 DIAGNOSIS — R1114 Bilious vomiting: Secondary | ICD-10-CM

## 2019-09-14 DIAGNOSIS — R55 Syncope and collapse: Secondary | ICD-10-CM | POA: Diagnosis not present

## 2019-09-14 DIAGNOSIS — Z79899 Other long term (current) drug therapy: Secondary | ICD-10-CM | POA: Insufficient documentation

## 2019-09-14 DIAGNOSIS — R61 Generalized hyperhidrosis: Secondary | ICD-10-CM | POA: Diagnosis not present

## 2019-09-14 DIAGNOSIS — I110 Hypertensive heart disease with heart failure: Secondary | ICD-10-CM | POA: Insufficient documentation

## 2019-09-14 DIAGNOSIS — R110 Nausea: Secondary | ICD-10-CM | POA: Diagnosis not present

## 2019-09-14 DIAGNOSIS — I5042 Chronic combined systolic (congestive) and diastolic (congestive) heart failure: Secondary | ICD-10-CM | POA: Diagnosis not present

## 2019-09-14 DIAGNOSIS — R112 Nausea with vomiting, unspecified: Secondary | ICD-10-CM | POA: Diagnosis not present

## 2019-09-14 DIAGNOSIS — R42 Dizziness and giddiness: Secondary | ICD-10-CM | POA: Diagnosis not present

## 2019-09-14 DIAGNOSIS — R079 Chest pain, unspecified: Secondary | ICD-10-CM | POA: Diagnosis not present

## 2019-09-14 DIAGNOSIS — Z20822 Contact with and (suspected) exposure to covid-19: Secondary | ICD-10-CM | POA: Diagnosis not present

## 2019-09-14 DIAGNOSIS — Z95810 Presence of automatic (implantable) cardiac defibrillator: Secondary | ICD-10-CM | POA: Diagnosis not present

## 2019-09-14 DIAGNOSIS — I509 Heart failure, unspecified: Secondary | ICD-10-CM | POA: Diagnosis not present

## 2019-09-14 DIAGNOSIS — R0902 Hypoxemia: Secondary | ICD-10-CM | POA: Diagnosis not present

## 2019-09-14 DIAGNOSIS — R519 Headache, unspecified: Secondary | ICD-10-CM | POA: Diagnosis not present

## 2019-09-14 DIAGNOSIS — R0789 Other chest pain: Secondary | ICD-10-CM | POA: Diagnosis not present

## 2019-09-14 DIAGNOSIS — R1110 Vomiting, unspecified: Secondary | ICD-10-CM | POA: Diagnosis not present

## 2019-09-14 DIAGNOSIS — I10 Essential (primary) hypertension: Secondary | ICD-10-CM | POA: Diagnosis not present

## 2019-09-14 HISTORY — DX: Heart failure, unspecified: I50.9

## 2019-09-14 HISTORY — DX: Essential (primary) hypertension: I10

## 2019-09-14 LAB — COMPREHENSIVE METABOLIC PANEL
ALT: 28 U/L (ref 0–44)
AST: 21 U/L (ref 15–41)
Albumin: 3.5 g/dL (ref 3.5–5.0)
Alkaline Phosphatase: 35 U/L — ABNORMAL LOW (ref 38–126)
Anion gap: 8 (ref 5–15)
BUN: 20 mg/dL (ref 8–23)
CO2: 23 mmol/L (ref 22–32)
Calcium: 8.9 mg/dL (ref 8.9–10.3)
Chloride: 106 mmol/L (ref 98–111)
Creatinine, Ser: 1.44 mg/dL — ABNORMAL HIGH (ref 0.61–1.24)
GFR calc Af Amer: 57 mL/min — ABNORMAL LOW (ref >60)
GFR calc non Af Amer: 49 mL/min — ABNORMAL LOW (ref >60)
Glucose, Bld: 157 mg/dL — ABNORMAL HIGH (ref 70–99)
Potassium: 4.3 mmol/L (ref 3.5–5.1)
Sodium: 137 mmol/L (ref 135–145)
Total Bilirubin: 1 mg/dL (ref 0.3–1.2)
Total Protein: 6.4 g/dL — ABNORMAL LOW (ref 6.5–8.1)

## 2019-09-14 LAB — TROPONIN I (HIGH SENSITIVITY)
Troponin I (High Sensitivity): 5 ng/L (ref <18)
Troponin I (High Sensitivity): <2 ng/L (ref <18)

## 2019-09-14 LAB — CBC WITH DIFFERENTIAL/PLATELET
Abs Immature Granulocytes: 0.02 10*3/uL (ref 0.00–0.07)
Basophils Absolute: 0.1 10*3/uL (ref 0.0–0.1)
Basophils Relative: 1 %
Eosinophils Absolute: 0.2 10*3/uL (ref 0.0–0.5)
Eosinophils Relative: 4 %
HCT: 41.6 % (ref 39.0–52.0)
Hemoglobin: 13.6 g/dL (ref 13.0–17.0)
Immature Granulocytes: 0 %
Lymphocytes Relative: 21 %
Lymphs Abs: 1.2 10*3/uL (ref 0.7–4.0)
MCH: 29.8 pg (ref 26.0–34.0)
MCHC: 32.7 g/dL (ref 30.0–36.0)
MCV: 91 fL (ref 80.0–100.0)
Monocytes Absolute: 0.4 10*3/uL (ref 0.1–1.0)
Monocytes Relative: 8 %
Neutro Abs: 3.5 10*3/uL (ref 1.7–7.7)
Neutrophils Relative %: 66 %
Platelets: 177 10*3/uL (ref 150–400)
RBC: 4.57 MIL/uL (ref 4.22–5.81)
RDW: 13.3 % (ref 11.5–15.5)
WBC: 5.4 10*3/uL (ref 4.0–10.5)
nRBC: 0 % (ref 0.0–0.2)

## 2019-09-14 LAB — CBG MONITORING, ED: Glucose-Capillary: 137 mg/dL — ABNORMAL HIGH (ref 70–99)

## 2019-09-14 LAB — LIPASE, BLOOD: Lipase: 26 U/L (ref 11–51)

## 2019-09-14 LAB — SARS CORONAVIRUS 2 BY RT PCR (HOSPITAL ORDER, PERFORMED IN ~~LOC~~ HOSPITAL LAB): SARS Coronavirus 2: NEGATIVE

## 2019-09-14 MED ORDER — ONDANSETRON HCL 4 MG/2ML IJ SOLN
4.0000 mg | Freq: Once | INTRAMUSCULAR | Status: AC
  Administered 2019-09-14: 4 mg via INTRAVENOUS
  Filled 2019-09-14: qty 2

## 2019-09-14 MED ORDER — SODIUM CHLORIDE 0.9 % IV BOLUS
500.0000 mL | Freq: Once | INTRAVENOUS | Status: AC
  Administered 2019-09-14: 500 mL via INTRAVENOUS

## 2019-09-14 NOTE — ED Notes (Signed)
Pt able to ambulate down hall and back without assistance. No endorsement of dizziness or impaired gait.

## 2019-09-14 NOTE — ED Notes (Signed)
Pt able to ambulate around doc box and back to his with no c/o. Nurse notified

## 2019-09-14 NOTE — Discharge Instructions (Addendum)
As discussed, your evaluation today has been largely reassuring.  But, it is important that you monitor your condition carefully, and do not hesitate to return to the ED if you develop new, or concerning changes in your condition. ? ?Otherwise, please follow-up with your physician for appropriate ongoing care. ? ?
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
## 2019-09-14 NOTE — ED Provider Notes (Signed)
8:33 PM Patient awake, alert, speaking clearly, no ongoing dizziness.  Patient did have 1 brief episode of symptoms while going for CT, but no additional vomiting.  He is now ambulated twice in the department without complication, without dizziness, without fall. I discussed CT results with him and his wife.  We discussed possibilities for his episode, including vertigo or vagal episode.  Less likely TIA, though this remains a possibility.  On we discussed outpatient follow-up with cardiology neurology versus inpatient stay for monitoring, management.  Patient has a preference for the former, he and his wife acknowledge the importance of close outpatient follow-up, return precautions.  Patient discharged in stable condition.   Carmin Muskrat, MD 09/14/19 2034

## 2019-09-14 NOTE — ED Triage Notes (Signed)
Pt here with [facility] with dizziness that was sudden onset. Pt was pale and diaphoretic. Pt is being ventricular paced with EMS

## 2019-09-17 ENCOUNTER — Ambulatory Visit (INDEPENDENT_AMBULATORY_CARE_PROVIDER_SITE_OTHER): Payer: Medicare Other

## 2019-09-17 DIAGNOSIS — Z95810 Presence of automatic (implantable) cardiac defibrillator: Secondary | ICD-10-CM | POA: Diagnosis not present

## 2019-09-17 DIAGNOSIS — I5042 Chronic combined systolic (congestive) and diastolic (congestive) heart failure: Secondary | ICD-10-CM | POA: Diagnosis not present

## 2019-09-18 NOTE — Progress Notes (Signed)
EPIC Encounter for ICM Monitoring  Patient Name: Russell Fuentes is a 69 y.o. male Date: 09/18/2019 Primary Care Physican: [name], MD Primary Cardiologist:Klein Electrophysiologist:Klein Bi-V Pacing:97.1% 08/07/2019 Weight:250lbs    Spoke with patient.  He explained yesterday, 6/14 he was sitting in a restaurant and after taking 2 sips of tea he began sweating, deep cough, so dizzy unable to stand, vomited and little Joia Doyle of breath.  911 called and was evaulated in ER.  Unable to find the cause at ER visit and pt is following up with Oda Kilts, PA tomorrow.  He feels a little "weak in the chest" today but no pain.  He is unsure if it is heart related or maybe something with his head.  ER said there were no episodes on his defib check.     OptivolThoracic impedance suggesting mild possible fluid accumulation for past 2 days.    Prescribed:Spironolactone 25 mg take 0.5 tablet (12.5 mg total) by mouth daily  Labs: 12/27/2018 Creatinine 1.28, BUN 17, Potassium 4.8, Sodium 140, GFR 57-66  Recommendations:No changes today and advised to discuss any concerns with Jonni Sanger at the office visit tomorrow.    Follow-up plan: ICM clinic phone appointment on6/22/2021 to recheck fluid levels. 91 day device clinic remote transmission 10/30/2019.   Copy of ICM check sent to Dr.Klein and Oda Kilts, PA since patient will be seen in the office by him.   3 month ICM trend: 09/17/2019    1 Year ICM trend:       Rosalene Billings, RN 09/18/2019 3:58 PM

## 2019-09-19 ENCOUNTER — Other Ambulatory Visit: Payer: Self-pay

## 2019-09-19 ENCOUNTER — Encounter: Payer: Self-pay | Admitting: Student

## 2019-09-19 ENCOUNTER — Ambulatory Visit (INDEPENDENT_AMBULATORY_CARE_PROVIDER_SITE_OTHER): Payer: Medicare Other | Admitting: Student

## 2019-09-19 VITALS — BP 110/64 | HR 77 | Ht 70.0 in | Wt 261.0 lb

## 2019-09-19 DIAGNOSIS — I5042 Chronic combined systolic (congestive) and diastolic (congestive) heart failure: Secondary | ICD-10-CM

## 2019-09-19 DIAGNOSIS — I428 Other cardiomyopathies: Secondary | ICD-10-CM

## 2019-09-19 DIAGNOSIS — R42 Dizziness and giddiness: Secondary | ICD-10-CM

## 2019-09-19 LAB — CUP PACEART INCLINIC DEVICE CHECK
Battery Remaining Longevity: 17 mo
Battery Voltage: 2.9 V
Brady Statistic AP VP Percent: 55.38 %
Brady Statistic AP VS Percent: 0.07 %
Brady Statistic AS VP Percent: 44.47 %
Brady Statistic AS VS Percent: 0.08 %
Brady Statistic RA Percent Paced: 54.65 %
Brady Statistic RV Percent Paced: 98.28 %
Date Time Interrogation Session: 20210616093609
HighPow Impedance: 51 Ohm
HighPow Impedance: 70 Ohm
Implantable Lead Implant Date: 20060323
Implantable Lead Implant Date: 20060323
Implantable Lead Implant Date: 20110420
Implantable Lead Location: 753858
Implantable Lead Location: 753859
Implantable Lead Location: 753860
Implantable Lead Model: 4193
Implantable Lead Model: 5076
Implantable Lead Model: 7121
Implantable Pulse Generator Implant Date: 20160613
Lead Channel Impedance Value: 342 Ohm
Lead Channel Impedance Value: 399 Ohm
Lead Channel Impedance Value: 4047 Ohm
Lead Channel Impedance Value: 4047 Ohm
Lead Channel Impedance Value: 456 Ohm
Lead Channel Impedance Value: 551 Ohm
Lead Channel Pacing Threshold Amplitude: 0.625 V
Lead Channel Pacing Threshold Amplitude: 1 V
Lead Channel Pacing Threshold Amplitude: 3.5 V
Lead Channel Pacing Threshold Pulse Width: 0.4 ms
Lead Channel Pacing Threshold Pulse Width: 0.4 ms
Lead Channel Pacing Threshold Pulse Width: 0.6 ms
Lead Channel Sensing Intrinsic Amplitude: 19.625 mV
Lead Channel Sensing Intrinsic Amplitude: 2.875 mV
Lead Channel Sensing Intrinsic Amplitude: 20.375 mV
Lead Channel Sensing Intrinsic Amplitude: 3.375 mV
Lead Channel Setting Pacing Amplitude: 1.5 V
Lead Channel Setting Pacing Amplitude: 2.25 V
Lead Channel Setting Pacing Amplitude: 2.5 V
Lead Channel Setting Pacing Pulse Width: 0.4 ms
Lead Channel Setting Pacing Pulse Width: 0.6 ms
Lead Channel Setting Sensing Sensitivity: 0.6 mV

## 2019-09-19 MED ORDER — FUROSEMIDE 20 MG PO TABS: 20.0000 mg | ORAL_TABLET | Freq: Every day | ORAL | 0 refills | Status: DC | PRN

## 2019-09-19 NOTE — Patient Instructions (Addendum)
Medication Instructions:  Your physician has recommended you make the following change in your medication:  Start Lasix 20 mg - Take 1 tablet by mouth as needed for swelling and edema.   *If you need a refill on your cardiac medications before your next appointment, please call your pharmacy*  Lab Work: If you have labs (blood work) drawn today and your tests are completely normal, you will receive your results only by: [name] MyChart Message (if you have MyChart) OR . A paper copy in the mail If you have any lab test that is abnormal or we need to change your treatment, we will call you to review the results.  Testing/Procedures: Your physician has requested that you have an echocardiogram. Echocardiography is a painless test that uses sound waves to create images of your heart. It provides your doctor with information about the size and shape of your heart and how well your heart's chambers and valves are working. This procedure takes approximately one hour. There are no restrictions for this procedure.   Follow-Up: At [facility], you and your health needs are our priority.  As part of our continuing mission to provide you with exceptional heart care, we have created designated Provider Care Teams.  These Care Teams include your primary Cardiologist (physician) and Advanced Practice Providers (APPs -  Physician Assistants and Nurse Practitioners) who all work together to provide you with the care you need, when you need it.  We recommend signing up for the patient portal called "MyChart".  Sign up information is provided on this After Visit Summary.  MyChart is used to connect with patients for Virtual Visits (Telemedicine).  Patients are able to view lab/test results, encounter notes, upcoming appointments, etc.  Non-urgent messages can be sent to your provider as well.   To learn more about what you can do with MyChart, go to [email or web address].    Your next appointment:   Your  physician recommends that you schedule a follow-up appointment in 3 months with Oda Kilts, PA-C  The format for your next appointment:   In Person  Provider:   Legrand Como "Jonni Sanger" Chalmers Cater, PA-C   Other Instructions ** Please fill your prescription for Lasix and take 1 tablet today**

## 2019-09-19 NOTE — Progress Notes (Signed)
Electrophysiology Office Note Date: 09/19/2019  ID:  Russell Fuentes, DOB 08-22-1950, MRN [id number]  PCP: [name], MD Primary Cardiologist: No primary care provider on file. Electrophysiologist: [name], MD   CC: Routine ICD follow-up  Russell Fuentes is a 69 y.o. male seen today for [name], MD for acute visit due to dizziness.  Since being seen in the ED the patient reports doing better. Work up was unrevealing with normal troponin and relatively normal labs apart from mild AKI. He reports he was seated for lunch, took 2 sips of his drink and had sudden onset dizziness and diaphoresis. No chest pain. He went outside for fresh air, had several "deep coughs" and vomited but did not feel better. He went to the ED for further. DDx included vertigo, vagal episode, vs TIA, though CT with no acute findings. He reports a heaviness in his head at times, and a long issue with inner ear problems. He has been recommended to have follow up with neurology, He  denies chest pain, palpitations, dyspnea, PND, orthopnea, nausea, vomiting, dizziness, syncope, edema, weight gain, or early satiety. He has not had ICD shocks.   Device History: Medtronic BiV ICD implanted 2006 for NICM and CHF, gen change 2011 with new RV lead (abandoned [phone] lead), gen change 09/2014 History of appropriate therapy: No History of AAD therapy: No   Past Medical History:  Diagnosis Date  . CHF (congestive heart failure) ([place])   . Chronic combined systolic and diastolic heart failure ([place])    . Diabetes mellitus with neuropathy ([place])    . Hypertension   . Nonischemic cardiomyopathy ([place])     a. s/p MDT CRTD  . Orthostatic lightheadedness     Past Surgical History:  Procedure Laterality Date  . CARDIAC CATHETERIZATION  12/31/1998   Bi-plane cine left ventriculography revealed low-normal left ventricular function with an ejection fraction in the 45% to 50% range.  . CORONARY ARTERY BYPASS GRAFT    .  DOPPLER ECHOCARDIOGRAPHY  04/28/2011   LVEF 41% by simpson's borderline concentric LVH, global hypokinesis with regional variation. Stage 1 diastolic dysfunction, normal LV filling pressure. Normal LA size. Right heart pacing wires noted. Trace MR, TR. Normal RVSP  . EP IMPLANTABLE DEVICE N/A 09/16/2014   Procedure: ICD Generator Changeout;  Surgeon: [name], MD;  Location: [facility] CV LAB;  Service: Cardiovascular;  Laterality: N/A;  . LEFT HEART CATHETERIZATION WITH CORONARY ANGIOGRAM N/A 12/11/2013   Procedure: LEFT HEART CATHETERIZATION WITH CORONARY ANGIOGRAM;  Surgeon: [name], MD;  Location[facility] CATH LAB;  Service: Cardiovascular;  Laterality: N/A;  . NM MYOCAR PERF EJECTION FRACTION  04/03/2009, 11/18/2005   The post stress myocardial perfusion images show a normal pattern of perfusion in all regions. The post-stress ejection fraction is 50%. No significant wall motion abnormalities noted. This is a low risk scan.    Current Outpatient Medications  Medication Sig Dispense Refill  . aspirin EC 81 MG tablet Take 81 mg by mouth daily. Swallow whole.    . carvedilol (COREG) 25 MG tablet TAKE 1 TABLET BY MOUTH TWICE DAILY WITH A MEAL 180 tablet 2  . glimepiride (AMARYL) 4 MG tablet Take 4 mg by mouth daily with breakfast.    . LOVASTATIN PO Take 20 mg by mouth daily.    . pioglitazone (ACTOS) 30 MG tablet Take 30 mg by mouth daily.    [name] spironolactone (ALDACTONE) 25 MG tablet Take 0.5 tablets (12.5 mg total)  by mouth daily. 45 tablet 3  . valsartan (DIOVAN) 80 MG tablet Take 1 tablet (80 mg total) by mouth daily. 90 tablet 2  . furosemide (LASIX) 20 MG tablet Take 1 tablet (20 mg total) by mouth daily as needed for fluid or edema. 90 tablet 0   No current facility-administered medications for this visit.    Allergies:   Entresto [sacubitril-valsartan] and Codeine   Social History: Social History   Socioeconomic History  . Marital status: Married    Spouse name: Not on  file  . Number of children: 3  . Years of education: 88  . Highest education level: Not on file  Occupational History  . Occupation: Physiological scientist  Tobacco Use  . Smoking status: Never Smoker  . Smokeless tobacco: Never Used  Substance and Sexual Activity  . Alcohol use: No    Alcohol/week: 0.0 standard drinks  . Drug use: Not on file  . Sexual activity: Not on file  Other Topics Concern  . Not on file  Social History Narrative  . Not on file   Social Determinants of Health   Financial Resource Strain:   . Difficulty of Paying Living Expenses:   Food Insecurity:   . Worried About Charity fundraiser in the Last Year:   . Arboriculturist in the Last Year:   Transportation Needs:   . Film/video editor (Medical):   [name] Lack of Transportation (Non-Medical):   Physical Activity:   . Days of Exercise per Week:   . Minutes of Exercise per Session:   Stress:   . Feeling of Stress :   Social Connections:   . Frequency of Communication with Friends and Family:   . Frequency of Social Gatherings with Friends and Family:   . Attends Religious Services:   . Active Member of Clubs or Organizations:   . Attends Archivist Meetings:   [name] Marital Status:   Intimate Partner Violence:   . Fear of Current or Ex-Partner:   . Emotionally Abused:   [name] Physically Abused:   . Sexually Abused:     Family History: Family History  Problem Relation Age of Onset  . Asthma Sister   . Asthma Brother   . Heart attack Father   . Heart attack Sister   . Cancer Mother   . Heart attack Mother   . Cancer Sister   . Hypertension Brother     Review of Systems: All other systems reviewed and are otherwise negative except as noted above.   Physical Exam: Vitals:   09/19/19 0853  BP: 110/64  Pulse: 77  SpO2: 98%  Weight: 261 lb (118.4 kg)  Height: 5\' 10"  (1.778 m)     GEN- The patient is well appearing, alert and oriented x 3 today.   HEENT: normocephalic, atraumatic;  sclera clear, conjunctiva pink; hearing intact; oropharynx clear; neck supple, no JVP Lymph- no cervical lymphadenopathy Lungs- Clear to ausculation bilaterally, normal work of breathing.  No wheezes, rales, rhonchi Heart- Regular rate and rhythm, no murmurs, rubs or gallops, PMI not laterally displaced GI- soft, non-tender, non-distended, bowel sounds present, no hepatosplenomegaly Extremities- no clubbing or cyanosis; DP/PT/radial pulses 2+ bilaterally. 1-2+ edema 1/2 to knees. MS- no significant deformity or atrophy Skin- warm and dry, no rash or lesion; ICD pocket well healed Psych- euthymic mood, full affect Neuro- strength and sensation are intact  ICD interrogation- reviewed in detail today,  See PACEART report  EKG:  EKG is  not ordered today.  Recent Labs: 09/14/2019: ALT 28; BUN 20; Creatinine, Ser 1.44; Hemoglobin 13.6; Platelets 177; Potassium 4.3; Sodium 137   Wt Readings from Last 3 Encounters:  09/19/19 261 lb (118.4 kg)  12/27/18 255 lb 3.2 oz (115.8 kg)  10/13/17 245 lb (111.1 kg)     Other studies Reviewed: Additional studies/ records that were reviewed today include: Previous EP office notes, ED notes and work up   Assessment and Plan:  1.  Chronic systolic dysfunction s/p Medtronic CRT-D  Volume status mildly elevated. Will add lasix 20 mg to take as needed.  Stable on an appropriate medical regimen Normal ICD function See Pace Art report LV amplitude fixed with recent uptrend by adaptive algorithm and stable threshold by manual check today.   2. Sudden onset dizziness He is not dizzy today or with standing.  Symptoms sound less vertiginous and more vagal given lack of spinning and occurred while eating. He denies getting choked up or coughing initially, but did have coughing spell and vomiting shortly after symptoms started. No tachy-arryhtmias noted on device interrogation. He has chronic inner ear issues and suspect this may be contributing. He has been  directed to schedule follow up with neurology.  He has a distant history of orthostatic lightheadedness but states these symptoms were different, and BP has been stable.   Current medicines are reviewed at length with the patient today.   The patient does not have concerns regarding his medicines.  The following changes were made today:  none  Labs/ tests ordered today include:  Orders Placed This Encounter  Procedures  . CUP PACEART [place]  . ECHOCARDIOGRAM LIMITED    Disposition:   Follow up with EP APP in 3 months.   Jacalyn Lefevre, PA-C  09/19/2019 12:11 PM  [place] [address] [phone] (office) [phone] (fax)

## 2019-09-25 ENCOUNTER — Ambulatory Visit (INDEPENDENT_AMBULATORY_CARE_PROVIDER_SITE_OTHER): Payer: Medicare Other

## 2019-09-25 DIAGNOSIS — Z95810 Presence of automatic (implantable) cardiac defibrillator: Secondary | ICD-10-CM

## 2019-09-25 DIAGNOSIS — I5042 Chronic combined systolic (congestive) and diastolic (congestive) heart failure: Secondary | ICD-10-CM

## 2019-09-28 ENCOUNTER — Telehealth: Payer: Self-pay

## 2019-09-28 NOTE — Progress Notes (Signed)
EPIC Encounter for ICM Monitoring  Patient Name: Russell Fuentes is a 69 y.o. male Date: 09/28/2019 Primary Care Physican: [name], MD Primary Cardiologist:Klein Electrophysiologist:Klein Bi-V Pacing:98.4% 08/07/2019 Weight:250lbs    Attempted call to patient and unable to reach.  Left detailed message per DPR regarding transmission. Transmission reviewed.   OptivolThoracic impedance returned to normal.   Prescribed:  Furosemide 20 mg Take 1 tablet (20 mg total) by mouth daily as needed for fluid or edema.  Spironolactone 25 mg take 0.5 tablet (12.5 mg total) by mouth daily  Labs: 12/27/2018 Creatinine 1.28, BUN 17, Potassium 4.8, Sodium 140, GFR 57-66  Recommendations:Left voice mail with ICM number and encouraged to call if experiencing any fluid symptoms.  Follow-up plan: ICM clinic phone appointment on7/28/2021.91 day device clinic remote transmission 10/30/2019.   Copy of ICM check sent to [place]  3 month ICM trend: 09/25/2019    1 Year ICM trend:       Rosalene Billings, RN 09/28/2019 11:24 AM

## 2019-09-28 NOTE — Telephone Encounter (Signed)
Remote ICM transmission received.  Attempted call to patient regarding ICM remote transmission and left detailed message per DPR.  Advised to return call for any fluid symptoms or questions. Next ICM remote transmission scheduled 10/31/2019.

## 2019-10-03 ENCOUNTER — Ambulatory Visit: Payer: Medicare Other | Admitting: Student

## 2019-10-20 ENCOUNTER — Encounter (HOSPITAL_COMMUNITY): Payer: Self-pay | Admitting: Emergency Medicine

## 2019-10-20 ENCOUNTER — Emergency Department (HOSPITAL_COMMUNITY)
Admission: EM | Admit: 2019-10-20 | Discharge: 2019-10-20 | Disposition: A | Discharge status: Home / Self Care | Payer: Medicare Other | Attending: Emergency Medicine | Admitting: Emergency Medicine

## 2019-10-20 ENCOUNTER — Other Ambulatory Visit: Payer: Self-pay

## 2019-10-20 DIAGNOSIS — I110 Hypertensive heart disease with heart failure: Secondary | ICD-10-CM | POA: Diagnosis not present

## 2019-10-20 DIAGNOSIS — R42 Dizziness and giddiness: Secondary | ICD-10-CM | POA: Diagnosis not present

## 2019-10-20 DIAGNOSIS — Z7982 Long term (current) use of aspirin: Secondary | ICD-10-CM | POA: Insufficient documentation

## 2019-10-20 DIAGNOSIS — Z79899 Other long term (current) drug therapy: Secondary | ICD-10-CM | POA: Insufficient documentation

## 2019-10-20 DIAGNOSIS — I5042 Chronic combined systolic (congestive) and diastolic (congestive) heart failure: Secondary | ICD-10-CM | POA: Diagnosis not present

## 2019-10-20 DIAGNOSIS — R112 Nausea with vomiting, unspecified: Secondary | ICD-10-CM | POA: Diagnosis not present

## 2019-10-20 DIAGNOSIS — Z951 Presence of aortocoronary bypass graft: Secondary | ICD-10-CM | POA: Insufficient documentation

## 2019-10-20 DIAGNOSIS — R231 Pallor: Secondary | ICD-10-CM | POA: Diagnosis not present

## 2019-10-20 DIAGNOSIS — Z7984 Long term (current) use of oral hypoglycemic drugs: Secondary | ICD-10-CM | POA: Insufficient documentation

## 2019-10-20 DIAGNOSIS — R52 Pain, unspecified: Secondary | ICD-10-CM | POA: Diagnosis not present

## 2019-10-20 DIAGNOSIS — R110 Nausea: Secondary | ICD-10-CM | POA: Diagnosis not present

## 2019-10-20 DIAGNOSIS — I959 Hypotension, unspecified: Secondary | ICD-10-CM | POA: Diagnosis not present

## 2019-10-20 DIAGNOSIS — E1140 Type 2 diabetes mellitus with diabetic neuropathy, unspecified: Secondary | ICD-10-CM | POA: Insufficient documentation

## 2019-10-20 LAB — CBC
HCT: 43.1 % (ref 39.0–52.0)
Hemoglobin: 13.9 g/dL (ref 13.0–17.0)
MCH: 29.4 pg (ref 26.0–34.0)
MCHC: 32.3 g/dL (ref 30.0–36.0)
MCV: 91.3 fL (ref 80.0–100.0)
Platelets: 154 10*3/uL (ref 150–400)
RBC: 4.72 MIL/uL (ref 4.22–5.81)
RDW: 13.2 % (ref 11.5–15.5)
WBC: 6.8 10*3/uL (ref 4.0–10.5)
nRBC: 0 % (ref 0.0–0.2)

## 2019-10-20 LAB — COMPREHENSIVE METABOLIC PANEL
ALT: 21 U/L (ref 0–44)
AST: 19 U/L (ref 15–41)
Albumin: 3.7 g/dL (ref 3.5–5.0)
Alkaline Phosphatase: 43 U/L (ref 38–126)
Anion gap: 9 (ref 5–15)
BUN: 18 mg/dL (ref 8–23)
CO2: 23 mmol/L (ref 22–32)
Calcium: 8.6 mg/dL — ABNORMAL LOW (ref 8.9–10.3)
Chloride: 104 mmol/L (ref 98–111)
Creatinine, Ser: 1.25 mg/dL — ABNORMAL HIGH (ref 0.61–1.24)
GFR calc Af Amer: >60 mL/min (ref >60)
GFR calc non Af Amer: 58 mL/min — ABNORMAL LOW (ref >60)
Glucose, Bld: 195 mg/dL — ABNORMAL HIGH (ref 70–99)
Potassium: 4.2 mmol/L (ref 3.5–5.1)
Sodium: 136 mmol/L (ref 135–145)
Total Bilirubin: 0.6 mg/dL (ref 0.3–1.2)
Total Protein: 6.5 g/dL (ref 6.5–8.1)

## 2019-10-20 LAB — CBG MONITORING, ED: Glucose-Capillary: 209 mg/dL — ABNORMAL HIGH (ref 70–99)

## 2019-10-20 LAB — TROPONIN I (HIGH SENSITIVITY): Troponin I (High Sensitivity): 4 ng/L (ref <18)

## 2019-10-20 LAB — LIPASE, BLOOD: Lipase: 25 U/L (ref 11–51)

## 2019-10-20 MED ORDER — MECLIZINE HCL 25 MG PO TABS
25.0000 mg | ORAL_TABLET | Freq: Three times a day (TID) | ORAL | 0 refills | Status: DC | PRN
Start: 2019-10-20 — End: 2021-05-14

## 2019-10-20 MED ORDER — MECLIZINE HCL 25 MG PO TABS
25.0000 mg | ORAL_TABLET | Freq: Once | ORAL | Status: AC
  Administered 2019-10-20: 25 mg via ORAL
  Filled 2019-10-20: qty 1

## 2019-10-20 MED ORDER — SODIUM CHLORIDE 0.9% FLUSH: 3.0000 mL | Freq: Once | INTRAVENOUS | Status: DC

## 2019-10-20 NOTE — ED Notes (Signed)
Pt reported he had Lt flank pain during the episodes this AM

## 2019-10-20 NOTE — Discharge Instructions (Addendum)
Begin taking meclizine as prescribed.  Follow-up with your cardiologist on Tuesday as scheduled, and return to the ER if your symptoms significantly worsen or change.

## 2019-10-20 NOTE — ED Provider Notes (Signed)
[facility] EMERGENCY DEPARTMENT Provider Note   CSN: [id number] Arrival date & time: 10/20/19  1334     History Chief Complaint  Patient presents with   Emesis   Dizziness   Nausea    Russell Fuentes is a 69 y.o. male.  Patient is a 69 year old male with past medical history of congestive heart failure with pacer defibrillator, diabetes, hypertension.  He presents today for evaluation of dizziness that began this morning.  He describes the acute onset of nausea and a dizzy sensation which he is having difficulty describing, but states that it was difficult for him to keep his balance.  He also reports sweating along with this.  He has felt nauseated but did not vomit.  Patient was given IV fluids and Zofran with improvement of his nausea.  He was seen here with similar episodes 1 month ago.  Work-up includes head CT and laboratory studies, none of which provided an explanation for his symptoms.  The history is provided by the patient.  Dizziness Quality:  Imbalance Severity:  Moderate Onset quality:  Sudden Duration:  12 hours Timing:  Intermittent Progression:  Resolved Chronicity:  Recurrent Relieved by:  Being still Worsened by:  Movement Ineffective treatments:  None tried      Past Medical History:  Diagnosis Date   CHF (congestive heart failure) (HCC)    Chronic combined systolic and diastolic heart failure (HCC)     Diabetes mellitus with neuropathy ([place])     Hypertension    Nonischemic cardiomyopathy ([place])     a. 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   CORONARY ARTERY BYPASS GRAFT     DOPPLER ECHOCARDIOGRAPHY  04/28/2011   LVEF 41% by simpson's borderline concentric LVH, global hypokinesis with regional variation. Stage 1 diastolic dysfunction, normal LV filling pressure. Normal LA size. Right heart pacing wires noted. Trace MR, TR. Normal RVSP   EP IMPLANTABLE DEVICE N/A 09/16/2014   Procedure: ICD Generator Changeout;  Surgeon: [name], MD;  Location: [place] CV LAB;  Service: Cardiovascular;  Laterality: N/A;   LEFT HEART CATHETERIZATION WITH CORONARY ANGIOGRAM N/A 12/11/2013   Procedure: LEFT HEART CATHETERIZATION WITH CORONARY ANGIOGRAM;  Surgeon: [name], MD;  Location: [facility] CATH LAB;  Service: Cardiovascular;  Laterality: N/A;   NM MYOCAR PERF EJECTION FRACTION  04/03/2009, 11/18/2005   The post stress myocardial perfusion images show a normal pattern of perfusion in all regions. The post-stress ejection fraction is 50%. No significant wall motion abnormalities noted. This is a low risk scan.       Family History  Problem  Relation Age of Onset   Asthma Sister    Asthma Brother    Heart attack Father    Heart attack Sister    Cancer Mother    Heart attack Mother    Cancer Sister    Hypertension Brother     Social History   Tobacco Use   Smoking status: Never Smoker     Smokeless tobacco: Never Used  Substance Use Topics   Alcohol use: No    Alcohol/week: 0.0 standard drinks   Drug use: Not on file    Home Medications Prior to Admission medications   Medication Sig Start Date End Date Taking? Authorizing Provider  aspirin EC 81 MG tablet Take 81 mg by mouth daily. Swallow whole.    [provider]  carvedilol (COREG) 25 MG tablet TAKE 1 TABLET BY MOUTH TWICE DAILY WITH A MEAL 02/05/19   [name], MD  furosemide (LASIX) 20 MG tablet Take 1 tablet (20 mg total) by mouth daily as needed for fluid or edema. 09/19/19 12/18/19  Shirley Friar, PA-C  glimepiride (AMARYL) 4 MG tablet Take 4 mg by mouth daily with breakfast.    [provider]  LOVASTATIN PO Take 20 mg by mouth daily.    [provider]  pioglitazone (ACTOS) 30 MG tablet Take 30 mg by mouth daily. 08/09/19   [provider]  spironolactone (ALDACTONE) 25 MG tablet Take 0.5 tablets (12.5 mg total) by mouth daily. 02/05/19   [name], MD  valsartan (DIOVAN) 80 MG tablet Take 1 tablet (80 mg total) by mouth daily. 02/05/19   [name], MD    Allergies    Delene Loll [sacubitril-valsartan] and Codeine  Review of Systems   Review of Systems  Neurological: Positive for dizziness.  All other systems reviewed and are negative.   Physical Exam Updated Vital Signs BP 127/63 (BP Location: Right Arm)    Pulse 69    Temp 99.1 F (37.3 C) (Oral)    Resp 16    SpO2 98%   Physical Exam Vitals and nursing note reviewed.  Constitutional:      General: He is not in acute distress.    Appearance: He is well-developed. He is not diaphoretic.  HENT:     Head: Normocephalic and atraumatic.  Eyes:     Extraocular Movements: Extraocular movements intact.     Pupils: Pupils are equal, round, and reactive to light.  Cardiovascular:     Rate and Rhythm: Normal rate and regular rhythm.     Heart sounds: No murmur heard.  No friction rub.   Pulmonary:     Effort: Pulmonary effort is normal. No respiratory distress.     Breath sounds: Normal breath sounds. No wheezing or rales.  Abdominal:     General: Bowel sounds are normal. There is no distension.     Palpations: Abdomen is soft.     Tenderness: There is no abdominal tenderness.  Musculoskeletal:        General: Normal range of motion.     Cervical back: Normal range of motion and neck supple.  Skin:    General: Skin is warm and dry.  Neurological:     General: No focal deficit present.     Mental Status: He is alert and oriented to person, place, and time.     Cranial Nerves: No cranial nerve deficit.     Sensory: No sensory deficit.     Motor: No weakness.  Coordination: Coordination normal.     ED Results / Procedures / Treatments   Labs (all labs ordered are listed, but only abnormal results are displayed) Labs Reviewed  COMPREHENSIVE METABOLIC PANEL - Abnormal; Notable for the following components:      Result Value   Glucose, Bld 195 (*)    Creatinine, Ser 1.25 (*)    Calcium 8.6 (*)    GFR calc non Af Amer 58 (*)    All other components within normal limits  CBG MONITORING, ED - Abnormal; Notable for the following components:   Glucose-Capillary 209 (*)    All other components within normal limits  LIPASE, BLOOD  CBC  URINALYSIS, ROUTINE W REFLEX MICROSCOPIC    EKG EKG Interpretation  Date/Time:  Saturday October 20 2019 13:38:41 EDT Ventricular Rate:  70 PR Interval:    QRS Duration: 138 QT Interval:  454 QTC Calculation: 490 R Axis:   171 Text Interpretation: AV dual-paced rhythm Abnormal ECG No significant change since 09/14/2019 Confirmed by Veryl Speak [phone]) on 10/20/2019 5:18:53 PM   Radiology No results found.  Procedures Procedures (including critical care time)  Medications Ordered in ED Medications  sodium chloride flush (NS) 0.9 % injection 3 mL (has no administration in time range)    ED Course  I have reviewed  the triage vital signs and the nursing notes.  Pertinent labs & imaging results that were available during my care of the patient were reviewed by me and considered in my medical decision making (see chart for details).    MDM Rules/Calculators/A&P  Patient is a 69 year old male with history of cardiomyopathy with pacer/defibrillator.  He presents today after episodes of dizziness.  Patient states that he woke up from sleep this morning, then experienced an episode of near syncope and room spinning.  This was associated with nausea and diaphoresis and lasted for several minutes.  Patient then felt better, then experienced another episode this afternoon after getting his haircut.  This too has since resolved.  Patient's work-up here today is unremarkable.  I see no laboratory abnormality.  His EKG shows a paced rhythm and has remained stable throughout his emergency department course.  He has been here for nearly 6 hours and show no ectopy on the monitor or concerning arrhythmias.  Patient denies having been shocked by his device during these episodes and did not lose consciousness.  What he is describing sounds like vertigo.  He was seen 1 month ago with a similar complaint and had work-up at that time that was unremarkable.  His CT was negative.  I highly doubt stroke.  At this point, I feel as though discharge is appropriate.  Patient will be given meclizine and is to follow-up with his cardiologist on Tuesday as scheduled.  He also has an ENT doctor which he will follow up his ear issues.  He also reports ringing in his right ear and decreased hearing to the left.  Final Clinical Impression(s) / ED Diagnoses Final diagnoses:  None    Rx / DC Orders ED Discharge Orders    None       Veryl Speak, MD 10/20/19 1924

## 2019-10-20 NOTE — ED Triage Notes (Signed)
EMS stated, pt has had N/V for 10 hours straight.  20g rt. AC. N/S 500CC bolus and Zofran 4mg  IV  No longer Nausea

## 2019-10-20 NOTE — ED Notes (Signed)
PT reports he had 2 episodes  Feeling dizzy  and sweating today. Pt reports same Sx's occurred in June this year.

## 2019-10-23 ENCOUNTER — Other Ambulatory Visit: Payer: Self-pay

## 2019-10-23 ENCOUNTER — Telehealth (HOSPITAL_COMMUNITY): Payer: Self-pay

## 2019-10-23 ENCOUNTER — Ambulatory Visit (HOSPITAL_COMMUNITY): Payer: Medicare Other | Attending: Internal Medicine

## 2019-10-23 DIAGNOSIS — I5042 Chronic combined systolic (congestive) and diastolic (congestive) heart failure: Secondary | ICD-10-CM | POA: Diagnosis not present

## 2019-10-23 LAB — ECHOCARDIOGRAM LIMITED
Area-P 1/2: 3.95 cm2
S' Lateral: 4 cm

## 2019-10-23 NOTE — Telephone Encounter (Signed)
Patient calling back.   °

## 2019-10-23 NOTE — Telephone Encounter (Signed)
-----   Message from Shirley Friar, PA-C sent at 10/23/2019  2:20 PM EDT ----- Please let him know echo showed stable heart function. Thank you!  Legrand Como [address] [place], PA-C  10/23/2019 2:20 PM

## 2019-10-23 NOTE — Telephone Encounter (Signed)
Pt called back stating he tried to get scheduled with [facility] as advised and was told he couldn't get an appt until september and he states his episodes are becoming more and more frequent and he was uncomfortable waiting that long and he wanted to see if I could call and get him an appointment sooner. I was able to contact [facility] who had a cancellation and appt available tomorrow at 2:00 pm. I confirmed appt with the patient is aware and agreeable to the appt.

## 2019-10-23 NOTE — Telephone Encounter (Signed)
Pt is aware are and agreeable to echo results. Pt stated he has been experiencing dizzy spells and coughing spells that are causing him to feel like he "has fluid in his head". He has been in the ED twice in the last 30 days for those symptoms. He was referred from the ED to see [facility] to which he did not follow up. I advised him to contact Neuro for consultation as directed by the ED and if they cannot find the cause then we would reevaluate. He is agreeable

## 2019-10-24 ENCOUNTER — Encounter: Payer: Self-pay | Admitting: Neurology

## 2019-10-24 ENCOUNTER — Ambulatory Visit (INDEPENDENT_AMBULATORY_CARE_PROVIDER_SITE_OTHER): Payer: Medicare Other | Admitting: Neurology

## 2019-10-24 DIAGNOSIS — H814 Vertigo of central origin: Secondary | ICD-10-CM | POA: Diagnosis not present

## 2019-10-24 NOTE — Telephone Encounter (Signed)
Great work!  Thank you.

## 2019-10-24 NOTE — Progress Notes (Signed)
Reason for visit: Vertigo  Referring physician: [place]  Russell Fuentes is a 69 y.o. male  History of present illness:  Russell Fuentes is a 69 year old right-handed white male with a history of diabetes, and a history of a cardiomyopathy with a cardiac pacemaker/defibrillator.  The patient comes in today for an evaluation of onset of dizziness/vertigo that began around 14 September 2019.  The patient indicates that he has had vertigo in the past 25 or 30 years ago but he has not been bothered with it for many years.  The patient lost hearing in the right ear with initial bouts of vertigo, within the last year he has started to have ringing in the ear on the right, he was seen and evaluated through ENT in August 2020, he underwent a CT scan of the temporal bone areas that was unremarkable.  The patient initially was in a restaurant when the vertigo began on 11 June, he had diaphoresis and eventually nausea and vomiting.  He did not feel well for about 8 hours.  The patient went to the emergency room and underwent a CT of the head that was unremarkable.  The patient underwent interrogation of his pacer and had a limited echocardiogram that showed stable heart function.  The patient reported no headache and no vision changes with exception that he had difficulty focusing when the vertigo was present.  The patient reports no focal numbness or weakness of the face, arms, legs.  He had no syncope.  The patient has felt some pressure sensation in his head ever since onset of the vertigo.  The patient had 4 episodes of vertigo on 20 October 2019, he went to the emergency room with one of the episodes as it was very severe.  The other episodes were more brief and less severe lasting 2 hours or so.  The patient may get sweats and nausea and vomiting with the episodes.  The patient feels unstable on his feet during the dizziness events, he feels like he is leaning to one side or the other.  He is sent to this office for  further evaluation.  Past Medical History:  Diagnosis Date  . CHF (congestive heart failure) ([place])   . Chronic combined systolic and diastolic heart failure ([place])    . Diabetes mellitus with neuropathy ([place])    . Hypertension   . Nonischemic cardiomyopathy ([place])     a. s/p MDT CRTD  . Orthostatic lightheadedness      Past Surgical History:  Procedure Laterality Date  . CARDIAC CATHETERIZATION  12/31/1998   Bi-plane cine left ventriculography revealed low-normal left ventricular function with an ejection fraction in the 45% to 50% range.  . CORONARY ARTERY BYPASS GRAFT    . DOPPLER ECHOCARDIOGRAPHY  04/28/2011   LVEF 41% by simpson's borderline concentric LVH, global hypokinesis with regional variation. Stage 1 diastolic dysfunction, normal LV filling pressure. Normal LA size. Right heart pacing wires noted. Trace MR, TR. Normal RVSP  . EP IMPLANTABLE DEVICE N/A 09/16/2014   Procedure: ICD Generator Changeout;  Surgeon: [name], MD;  Location: [place] CV LAB;  Service: Cardiovascular;  Laterality: N/A;  . LEFT HEART CATHETERIZATION WITH CORONARY ANGIOGRAM N/A 12/11/2013   Procedure: LEFT HEART CATHETERIZATION WITH CORONARY ANGIOGRAM;  Surgeon: [name], MD;  Location: [facility] CATH LAB;  Service: Cardiovascular;  Laterality: N/A;  . NM MYOCAR PERF EJECTION FRACTION  04/03/2009, 11/18/2005   The post stress myocardial perfusion images show a  normal pattern of perfusion in all regions. The post-stress ejection fraction is 50%. No significant wall motion abnormalities noted. This is a low risk scan.    Family History  Problem Relation Age of Onset  . Asthma Sister   . Asthma Brother   . Heart attack Father   . Heart attack Sister   . Cancer Mother   . Heart attack Mother   . Cancer Sister   . Hypertension Brother     Social history:  reports that he has never smoked. He has never used smokeless tobacco. He reports that he does not drink alcohol. No history on file for drug  use.  Medications:  Prior to Admission medications   Medication Sig Start Date End Date Taking? Authorizing Provider  aspirin EC 81 MG tablet Take 81 mg by mouth daily. Swallow whole.   Yes [provider]  carvedilol (COREG) 25 MG tablet TAKE 1 TABLET BY MOUTH TWICE DAILY WITH A MEAL 02/05/19  Yes [name], MD  furosemide (LASIX) 20 MG tablet Take 1 tablet (20 mg total) by mouth daily as needed for fluid or edema. 09/19/19 12/18/19 Yes Tillery, Satira Mccallum, PA-C  glimepiride (AMARYL) 4 MG tablet Take 4 mg by mouth daily with breakfast.   Yes [provider]  LOVASTATIN PO Take 20 mg by mouth daily.   Yes [provider]  meclizine (ANTIVERT) 25 MG tablet Take 1 tablet (25 mg total) by mouth 3 (three) times daily as needed for dizziness. 10/20/19  Yes Delo, Nathaneil Canary, MD  pioglitazone (ACTOS) 30 MG tablet Take 30 mg by mouth daily. 08/09/19  Yes [provider]  spironolactone (ALDACTONE) 25 MG tablet Take 0.5 tablets (12.5 mg total) by mouth daily. 02/05/19  Yes [name], MD  valsartan (DIOVAN) 80 MG tablet Take 1 tablet (80 mg total) by mouth daily. 02/05/19  Yes [name], MD      Allergies  Allergen Reactions  . Entresto [Sacubitril-Valsartan]     Fatigue, joint pain. Ok on valsartan alone  . Codeine Rash    ROS:  Out of a complete 14 system review of symptoms, the patient complains only of the following symptoms, and all other reviewed systems are negative.  Vertigo, nausea Swelling in the legs  Blood pressure 131/76, pulse 71, height 5\' 11"  (1.803 m), weight 261 lb (118.4 kg).  Physical Exam  General: The patient is alert and cooperative at the time of the examination.  Eyes: Pupils are equal, round, and reactive to light. Discs are flat bilaterally.  Ears: Tympanic membranes are clear bilaterally.  Neck: The neck is supple, no carotid bruits are noted.  Respiratory: The respiratory examination is  clear.  Cardiovascular: The cardiovascular examination reveals a regular rate and rhythm, no obvious murmurs or rubs are noted.  Skin: Extremities are with 1-2+ edema below the knees bilaterally.  Neurologic Exam  Mental status: The patient is alert and oriented x 3 at the time of the examination. The patient has apparent normal recent and remote memory, with an apparently normal attention span and concentration ability.  Cranial nerves: Facial symmetry is present. There is good sensation of the face to pinprick and soft touch bilaterally. The strength of the facial muscles and the muscles to head turning and shoulder shrug are normal bilaterally. Speech is well enunciated, no aphasia or dysarthria is noted. Extraocular movements are full. Visual fields are full. The tongue is midline, and the patient has symmetric elevation of the soft  palate. No obvious hearing deficits are noted.  Motor: The motor testing reveals 5 over 5 strength of all 4 extremities. Good symmetric motor tone is noted throughout.  Sensory: Sensory testing is intact to pinprick, soft touch, vibration sensation, and position sense on all 4 extremities. No evidence of extinction is noted.  Coordination: Cerebellar testing reveals good finger-nose-finger and heel-to-shin bilaterally.  Gait and station: Gait is normal. Tandem gait is unsteady. Romberg is negative. No drift is seen.  Reflexes: Deep tendon reflexes are symmetric, but are somewhat depressed bilaterally. Toes are downgoing bilaterally.   CT head 09/14/19:  IMPRESSION: Frontal and to a lesser extent parietal lobe atrophy. Ventricles normal in size and configuration. Brain parenchyma appears unremarkable. No mass or hemorrhage.  There are foci of arterial vascular calcification. There are several sites of paranasal sinus disease.  * CT scan images were reviewed online. I agree with the written report.    Assessment/Plan:  1.  Episodic vertigo  The  patient has a prior history of hearing loss in the right ear with vertigo of 25 to 30 years ago, he has had onset of episodes of vertigo and nausea and vomiting recently beginning in June 2021.  He has multiple risk factors for stroke, we will set him up for CT angiogram of the head neck, he cannot have MRI.  If the above study is unremarkable, I will set him up for vestibular rehabilitation, he will follow up here in 4 months.  Jill Alexanders MD 10/24/2019 2:04 PM  [facility] [address] [place], [place] [address]  Phone [phone] Fax [phone]

## 2019-10-30 ENCOUNTER — Ambulatory Visit (INDEPENDENT_AMBULATORY_CARE_PROVIDER_SITE_OTHER): Payer: Medicare Other | Admitting: *Deleted

## 2019-10-30 DIAGNOSIS — I429 Cardiomyopathy, unspecified: Secondary | ICD-10-CM | POA: Diagnosis not present

## 2019-10-31 ENCOUNTER — Other Ambulatory Visit: Payer: Self-pay | Admitting: Internal Medicine

## 2019-10-31 ENCOUNTER — Ambulatory Visit (INDEPENDENT_AMBULATORY_CARE_PROVIDER_SITE_OTHER): Payer: Medicare Other

## 2019-10-31 DIAGNOSIS — I5042 Chronic combined systolic (congestive) and diastolic (congestive) heart failure: Secondary | ICD-10-CM

## 2019-10-31 DIAGNOSIS — Z95810 Presence of automatic (implantable) cardiac defibrillator: Secondary | ICD-10-CM | POA: Diagnosis not present

## 2019-10-31 LAB — CUP PACEART REMOTE DEVICE CHECK
Battery Remaining Longevity: 18 mo
Battery Voltage: 2.92 V
Brady Statistic AP VP Percent: 46.74 %
Brady Statistic AP VS Percent: 0.07 %
Brady Statistic AS VP Percent: 53.05 %
Brady Statistic AS VS Percent: 0.14 %
Brady Statistic RA Percent Paced: 46.34 %
Brady Statistic RV Percent Paced: 98.59 %
Date Time Interrogation Session: 20210727001607
HighPow Impedance: 50 Ohm
HighPow Impedance: 62 Ohm
Implantable Lead Implant Date: 20060323
Implantable Lead Implant Date: 20060323
Implantable Lead Implant Date: 20110420
Implantable Lead Location: 753858
Implantable Lead Location: 753859
Implantable Lead Location: 753860
Implantable Lead Model: 4193
Implantable Lead Model: 5076
Implantable Lead Model: 7121
Implantable Pulse Generator Implant Date: 20160613
Lead Channel Impedance Value: 304 Ohm
Lead Channel Impedance Value: 399 Ohm
Lead Channel Impedance Value: 4047 Ohm
Lead Channel Impedance Value: 4047 Ohm
Lead Channel Impedance Value: 456 Ohm
Lead Channel Impedance Value: 532 Ohm
Lead Channel Pacing Threshold Amplitude: 0.625 V
Lead Channel Pacing Threshold Amplitude: 1.125 V
Lead Channel Pacing Threshold Amplitude: 4.25 V
Lead Channel Pacing Threshold Pulse Width: 0.4 ms
Lead Channel Pacing Threshold Pulse Width: 0.4 ms
Lead Channel Pacing Threshold Pulse Width: 0.6 ms
Lead Channel Sensing Intrinsic Amplitude: 2.5 mV
Lead Channel Sensing Intrinsic Amplitude: 2.5 mV
Lead Channel Sensing Intrinsic Amplitude: 26.25 mV
Lead Channel Sensing Intrinsic Amplitude: 26.25 mV
Lead Channel Setting Pacing Amplitude: 1.5 V
Lead Channel Setting Pacing Amplitude: 2.25 V
Lead Channel Setting Pacing Amplitude: 2.5 V
Lead Channel Setting Pacing Pulse Width: 0.4 ms
Lead Channel Setting Pacing Pulse Width: 0.6 ms
Lead Channel Setting Sensing Sensitivity: 0.6 mV

## 2019-11-05 NOTE — Progress Notes (Signed)
EPIC Encounter for ICM Monitoring  Patient Name: Russell Fuentes is a 69 y.o. male Date: 11/05/2019 Primary Care Physican: [name], MD Primary Cardiologist:Klein Electrophysiologist:Klein Bi-V Pacing:98.6% 11/05/2019 Weight:260lbs   Spoke with patient.  He feels pressure in his head likes he has a cold.  ER visit for vertigo but could not determine cause so he has followed up with a neurologist.  He has had a couple of episodes that he gets dizzy and starts coughing that feels like he is going to pass out.  He started Actos about 2 months and wonders if the symptoms could be related.  OptivolThoracic impedance normal.  Prescribed:  Furosemide 20 mg Take 1 tablet (20 mg total) by mouth daily as needed for fluid or edema.  Spironolactone 25 mg take 0.5 tablet (12.5 mg total) by mouth daily  Labs: 12/27/2018 Creatinine 1.28, BUN 17, Potassium 4.8, Sodium 140, GFR 57-66  Recommendations: Advised to call physician that prescribed Actos to check if his symptoms could be related.  No changes and encouraged to call if experiencing any fluid symptoms.   Follow-up plan: ICM clinic phone appointment on8/30/2021.91 day device clinic remote transmission 01/29/2020.    EP/Cardiology Office Visits: 12/18/2019 with Dr. [name].    Copy of ICM check sent to Dr. [name].   3 month ICM trend: 10/30/2019    1 Year ICM trend:       Rosalene Billings, RN 11/05/2019 11:17 AM

## 2019-11-05 NOTE — Progress Notes (Signed)
Remote ICD transmission.   

## 2019-11-08 ENCOUNTER — Telehealth: Payer: Self-pay

## 2019-11-08 ENCOUNTER — Other Ambulatory Visit: Payer: Self-pay | Admitting: Internal Medicine

## 2019-11-08 NOTE — Telephone Encounter (Signed)
The pt Russell Fuentes is being denied. He wants to know why he is being declined. I told him I will have Dr. [name] nurse to give him a call back. The pt states he needs to hear something soon because he about to run out of medicine and about to go out of town.

## 2019-11-09 ENCOUNTER — Other Ambulatory Visit: Payer: Self-pay

## 2019-11-09 ENCOUNTER — Ambulatory Visit
Admission: RE | Admit: 2019-11-09 | Discharge: 2019-11-09 | Disposition: A | Discharge status: Home / Self Care | Payer: Medicare Other | Source: Ambulatory Visit | Attending: Neurology | Admitting: Neurology

## 2019-11-09 ENCOUNTER — Telehealth: Payer: Self-pay | Admitting: Neurology

## 2019-11-09 DIAGNOSIS — H814 Vertigo of central origin: Secondary | ICD-10-CM

## 2019-11-09 DIAGNOSIS — R42 Dizziness and giddiness: Secondary | ICD-10-CM | POA: Diagnosis not present

## 2019-11-09 DIAGNOSIS — I672 Cerebral atherosclerosis: Secondary | ICD-10-CM | POA: Diagnosis not present

## 2019-11-09 DIAGNOSIS — I6523 Occlusion and stenosis of bilateral carotid arteries: Secondary | ICD-10-CM | POA: Diagnosis not present

## 2019-11-09 DIAGNOSIS — R112 Nausea with vomiting, unspecified: Secondary | ICD-10-CM | POA: Diagnosis not present

## 2019-11-09 DIAGNOSIS — G319 Degenerative disease of nervous system, unspecified: Secondary | ICD-10-CM | POA: Diagnosis not present

## 2019-11-09 MED ORDER — IOPAMIDOL (ISOVUE-300) INJECTION 61%: 100.0000 mL | Freq: Once | INTRAVENOUS | Status: DC | PRN

## 2019-11-09 MED ORDER — IOPAMIDOL (ISOVUE-370) INJECTION 76%
75.0000 mL | Freq: Once | INTRAVENOUS | Status: AC | PRN
  Administered 2019-11-09: 75 mL via INTRAVENOUS

## 2019-11-09 NOTE — Telephone Encounter (Signed)
I called the patient.  The CT angiogram was relatively unremarkable.  As per previous evaluation, we will set up for vestibular rehabilitation.  The patient is amenable to this.  The patient indicates that he does have difficulty sometimes equalizing pressure in the ears, he could potentially have some dysfunction of the eustachian tubes.  If the vestibular rehabilitation does not help, we may consider referral to an ear nose and throat doctor.   CTA head and neck 11/09/19:  IMPRESSION: 1. No evidence of acute intracranial abnormality. 2. Mild intracranial atherosclerosis without large vessel occlusion or significant proximal stenosis. 3. Widely patent cervical carotid and vertebral arteries.

## 2019-11-09 NOTE — Telephone Encounter (Signed)
Spoke with pt and advised Valsartan has been sent to pharmacy and receipt was confirmed.  Pt verbalizes understanding and agrees with current plan.

## 2019-11-21 ENCOUNTER — Telehealth: Payer: Self-pay | Admitting: Cardiology

## 2019-11-21 NOTE — Telephone Encounter (Signed)
New Message  We are recommending the COVID-19 vaccine to all of our patients. Cardiac medications (including blood thinners) should not deter anyone from being vaccinated and there is no need to hold any of those medications prior to vaccine administration.     Currently, there is a hotline to call (active 04/13/19) to schedule vaccination appointments as no walk-ins will be accepted.   Number: [phone].    If an appointment is not available please go to [email or web address] to sign up for notification when additional vaccine appointments are available.   If you have further questions or concerns about the vaccine process, please visit [email or web address] or contact your primary care physician.   

## 2019-12-03 ENCOUNTER — Ambulatory Visit (INDEPENDENT_AMBULATORY_CARE_PROVIDER_SITE_OTHER): Payer: Medicare Other

## 2019-12-03 DIAGNOSIS — Z95810 Presence of automatic (implantable) cardiac defibrillator: Secondary | ICD-10-CM

## 2019-12-03 DIAGNOSIS — I5042 Chronic combined systolic (congestive) and diastolic (congestive) heart failure: Secondary | ICD-10-CM

## 2019-12-05 ENCOUNTER — Telehealth: Payer: Self-pay

## 2019-12-05 NOTE — Telephone Encounter (Signed)
Remote ICM transmission received.  Attempted call to patient regarding ICM remote transmission and left detailed message per DPR.  Advised to return call for any fluid symptoms or questions. Next ICM remote transmission scheduled 01/07/2020.

## 2019-12-05 NOTE — Progress Notes (Signed)
EPIC Encounter for ICM Monitoring  Patient Name: Russell Fuentes is a 69 y.o. male Date: 12/05/2019 Primary Care Physican: [name], MD Primary Cardiologist:Klein Electrophysiologist:Klein Bi-V Pacing:98.2% 11/05/2019 Weight:260lbs   Attempted call to patient and unable to reach.  Left detailed message per DPR regarding transmission. Transmission reviewed.   OptivolThoracic impedance normal.  Prescribed:  Furosemide 20 mgTake 1 tablet (20 mg total) by mouth daily as needed for fluid or edema.  Spironolactone 25 mg take 0.5 tablet (12.5 mg total) by mouth daily  Labs: 12/27/2018 Creatinine 1.28, BUN 17, Potassium 4.8, Sodium 140, GFR 57-66  Recommendations: Left voice mail with ICM number and encouraged to call if experiencing any fluid symptoms.  Follow-up plan: ICM clinic phone appointment on10/07/2019.91 day device clinic remote transmission 01/29/2020.    EP/Cardiology Office Visits: 12/18/2019 with Dr. [name].    Copy of ICM check sent to Dr. [name].   3 month ICM trend: 12/03/2019    1 Year ICM trend:       Rosalene Billings, RN 12/05/2019 9:32 AM

## 2019-12-07 ENCOUNTER — Ambulatory Visit: Payer: Medicare Other | Admitting: Neurology

## 2019-12-17 NOTE — Progress Notes (Signed)
Patient Care Team: [name], MD as PCP - General (Family Medicine) [name], MD as PCP - Electrophysiology (Cardiology)   HPI  Russell Fuentes is a 69 y.o. male Seen in followup for an ICD  CRT Medtronic implanted for nonischemic cardiomyopathy;  he underwent generator replacement 6/16   Seen following episodes of abrupt dizziness associated with nausea and vomiting.  Dr. Jannifer Franklin from neurology evaluated; his impression was vertigo.  CTA was undertaken and was negative.  Vestibular rehab was recommended  Date Cr K Hgb  6/19 1.17 4.3 15.5  2/20 1.33 4.3    7/21 1.25 4.2 13.9   Has been followed by ICM clinic.  Some fluid.  Sleeping upright but I think it is largely related to head and neck congestion.  Dr. Tobey Grim notes describe difficulty in equalizing pressure.  DATE TEST EF   11/14 Echo   55-65 %   9/15 LHC  No obstructive CAD  5/18 Echo   35 %   5/18 Echo  45  AV/VV optimization  8/19 Echo  35-40%   7/21 Echo  40-45%       Past Medical History:  Diagnosis Date  . CHF (congestive heart failure) ([place])   . Chronic combined systolic and diastolic heart failure ([place])    . Diabetes mellitus with neuropathy ([place])    . Hypertension   . Nonischemic cardiomyopathy ([place])     a. s/p MDT CRTD  . Orthostatic lightheadedness      Past Surgical History:  Procedure Laterality Date  . CARDIAC CATHETERIZATION  12/31/1998   Bi-plane cine left ventriculography revealed low-normal left ventricular function with an ejection fraction in the 45% to 50% range.  . CORONARY ARTERY BYPASS GRAFT    . DOPPLER ECHOCARDIOGRAPHY  04/28/2011   LVEF 41% by simpson's borderline concentric LVH, global hypokinesis with regional variation. Stage 1 diastolic dysfunction, normal LV filling pressure. Normal LA size. Right heart pacing wires noted. Trace MR, TR. Normal RVSP  . EP IMPLANTABLE DEVICE N/A 09/16/2014   Procedure: ICD Generator Changeout;  Surgeon: [name], MD;   Location: [facility];  Service: Cardiovascular;  Laterality: N/A;  . LEFT HEART CATHETERIZATION WITH CORONARY ANGIOGRAM N/A 12/11/2013   Procedure: LEFT HEART CATHETERIZATION WITH CORONARY ANGIOGRAM;  Surgeon: [name], MD;  Location: [facility];  Service: Cardiovascular;  Laterality: N/A;  . NM MYOCAR PERF EJECTION FRACTION  04/03/2009, 11/18/2005   The post stress myocardial perfusion images show a normal pattern of perfusion in all regions. The post-stress ejection fraction is 50%. No significant wall motion abnormalities noted. This is a low risk scan.    Current Outpatient Medications  Medication Sig Dispense Refill  . aspirin EC 81 MG tablet Take 81 mg by mouth daily. Swallow whole.    . carvedilol (COREG) 25 MG tablet TAKE 1 TABLET BY MOUTH TWICE DAILY WITH A MEAL 180 tablet 3  . furosemide (LASIX) 20 MG tablet Take 1 tablet (20 mg total) by mouth daily as needed for fluid or edema. 90 tablet 0  . glimepiride (AMARYL) 4 MG tablet Take 4 mg by mouth daily with breakfast.    . LOVASTATIN PO Take 20 mg by mouth daily.    . meclizine (ANTIVERT) 25 MG tablet Take 1 tablet (25 mg total) by mouth 3 (three) times daily as needed for dizziness. 12 tablet 0  . pioglitazone (ACTOS) 30 MG tablet Take 30 mg by mouth daily.    [name]  spironolactone (ALDACTONE) 25 MG tablet Take 0.5 tablets (12.5 mg total) by mouth daily. 45 tablet 3  . valsartan (DIOVAN) 80 MG tablet TAKE 1 TABLET(80 MG) BY MOUTH DAILY 90 tablet 3   No current facility-administered medications for this visit.    Allergies  Allergen Reactions  . Entresto [Sacubitril-Valsartan]     Fatigue, joint pain. Ok on valsartan alone  . Codeine Rash    Review of Systems negative except from HPI and PMH  Physical Exam BP 138/80   Pulse 74   Ht 5\' 11"  (1.803 m)   Wt 258 lb (117 kg)   SpO2 97%   BMI 35.98 kg/m  Well developed and well nourished in no acute distress HENT normal Neck supple with JVP 7clear Device pocket  well healed; without hematoma or erythema.  There is no tethering  Regular rate and rhythm, no for final* murmur Abd-soft with active BS No Clubbing cyanosis tr edema Skin-warm and dry A & Oriented  Grossly normal sensory and motor function  ECG P-synchronous/ AV  pacing Upright QRS lead V1 negative QRS lead I QRS duration 128 ms    Assessment and  Plan  Nonischemic cardiomyopathy-   Congestive heart failure-acute/chronic-diastolic  Diabetes  Implantable defibrillator-Medtronic- CRT   High Risk Medication Surveillance Aldactone  Vertigo  Vaccination status negative   Mostly euvolemic.  OptiVol is flat.  We will begin him on every other day diuretics.-Furosemide 20  Device function is normal.  There is some variability in LV threshold so I have increased his pulse width from 0.6--0.8 ms.  Lengthy discussion regarding his episodic symptoms.  Dr. Jannifer Franklin thinks they are vertigo.  I will defer to his expertise.  What strikes me is that there was no aggravation in symptoms with motion.  Also surprised that it awakened him from sleep on at least one occasion.  Continue current guideline directed therapies.  Discussed Covid vaccine-encouraged him to get it done.

## 2019-12-18 ENCOUNTER — Encounter: Payer: Self-pay | Admitting: Internal Medicine

## 2019-12-18 ENCOUNTER — Other Ambulatory Visit: Payer: Self-pay

## 2019-12-18 ENCOUNTER — Ambulatory Visit (INDEPENDENT_AMBULATORY_CARE_PROVIDER_SITE_OTHER): Payer: Medicare Other | Admitting: Internal Medicine

## 2019-12-18 VITALS — BP 138/80 | HR 74 | Ht 71.0 in | Wt 258.0 lb

## 2019-12-18 DIAGNOSIS — Z95810 Presence of automatic (implantable) cardiac defibrillator: Secondary | ICD-10-CM | POA: Diagnosis not present

## 2019-12-18 DIAGNOSIS — R42 Dizziness and giddiness: Secondary | ICD-10-CM | POA: Diagnosis not present

## 2019-12-18 DIAGNOSIS — I428 Other cardiomyopathies: Secondary | ICD-10-CM | POA: Diagnosis not present

## 2019-12-18 MED ORDER — FUROSEMIDE 20 MG PO TABS: ORAL_TABLET | ORAL | 3 refills | Status: DC

## 2019-12-18 NOTE — Patient Instructions (Signed)
Medication Instructions:   ** Begin taking Lasix 20mg  every other day for swelling.  *If you need a refill on your cardiac medications before your next appointment, please call your pharmacy*   Lab Work: None ordered.  If you have labs (blood work) drawn today and your tests are completely normal, you will receive your results only by: [name] MyChart Message (if you have MyChart) OR . A paper copy in the mail If you have any lab test that is abnormal or we need to change your treatment, we will call you to review the results.   Testing/Procedures: None ordered.    Follow-Up: At [facility], you and your health needs are our priority.  As part of our continuing mission to provide you with exceptional heart care, we have created designated Provider Care Teams.  These Care Teams include your primary Cardiologist (physician) and Advanced Practice Providers (APPs -  Physician Assistants and Nurse Practitioners) who all work together to provide you with the care you need, when you need it.  We recommend signing up for the patient portal called "MyChart".  Sign up information is provided on this After Visit Summary.  MyChart is used to connect with patients for Virtual Visits (Telemedicine).  Patients are able to view lab/test results, encounter notes, upcoming appointments, etc.  Non-urgent messages can be sent to your provider as well.   To learn more about what you can do with MyChart, go to [email or web address].    Your next appointment:   6 month(s)  The format for your next appointment:   In Person  Provider:   [name], MD

## 2019-12-28 DIAGNOSIS — R351 Nocturia: Secondary | ICD-10-CM | POA: Diagnosis not present

## 2019-12-28 DIAGNOSIS — N401 Enlarged prostate with lower urinary tract symptoms: Secondary | ICD-10-CM | POA: Diagnosis not present

## 2019-12-28 DIAGNOSIS — R972 Elevated prostate specific antigen [PSA]: Secondary | ICD-10-CM | POA: Diagnosis not present

## 2019-12-30 DIAGNOSIS — R509 Fever, unspecified: Secondary | ICD-10-CM | POA: Diagnosis not present

## 2019-12-30 DIAGNOSIS — Z20828 Contact with and (suspected) exposure to other viral communicable diseases: Secondary | ICD-10-CM | POA: Diagnosis not present

## 2019-12-30 DIAGNOSIS — R519 Headache, unspecified: Secondary | ICD-10-CM | POA: Diagnosis not present

## 2020-01-01 DIAGNOSIS — U071 COVID-19: Secondary | ICD-10-CM | POA: Diagnosis not present

## 2020-01-07 ENCOUNTER — Ambulatory Visit (INDEPENDENT_AMBULATORY_CARE_PROVIDER_SITE_OTHER): Payer: Medicare Other

## 2020-01-07 DIAGNOSIS — I5042 Chronic combined systolic (congestive) and diastolic (congestive) heart failure: Secondary | ICD-10-CM | POA: Diagnosis not present

## 2020-01-07 DIAGNOSIS — Z95810 Presence of automatic (implantable) cardiac defibrillator: Secondary | ICD-10-CM

## 2020-01-08 DIAGNOSIS — R051 Acute cough: Secondary | ICD-10-CM | POA: Diagnosis not present

## 2020-01-08 DIAGNOSIS — R918 Other nonspecific abnormal finding of lung field: Secondary | ICD-10-CM | POA: Diagnosis not present

## 2020-01-09 DIAGNOSIS — R059 Cough, unspecified: Secondary | ICD-10-CM | POA: Diagnosis not present

## 2020-01-09 DIAGNOSIS — U071 COVID-19: Secondary | ICD-10-CM | POA: Diagnosis not present

## 2020-01-10 ENCOUNTER — Telehealth: Payer: Self-pay

## 2020-01-10 NOTE — Progress Notes (Signed)
EPIC Encounter for ICM Monitoring  Patient Name: Russell Fuentes is a 69 y.o. male Date: 01/10/2020 Primary Care Physican: [name], MD Primary Cardiologist:Klein Electrophysiologist:Klein Bi-V Pacing:97.3% 12/18/2019 Office Weight:258lbs   Attempted call to patient and unable to reach.  Transmission reviewed.   OptivolThoracic impedance normal.  Prescribed:  Furosemide 20 mgTake 1 tablet (20 mg total) by mouth every other day.  Spironolactone 25 mg take 0.5 tablet (12.5 mg total) by mouth daily  Labs: 12/27/2018 Creatinine 1.28, BUN 17, Potassium 4.8, Sodium 140, GFR 57-66  Recommendations: Unable to reach.    Follow-up plan: ICM clinic phone appointment on11/11/2019.91 day device clinic remote transmission10/26/2021.   EP/Cardiology Office Visits: Recall 06/15/2020 with Dr.Klein.   Copy of ICM check sent to [place].   3 month ICM trend: 01/07/2020    1 Year ICM trend:       Rosalene Billings, RN 01/10/2020 2:08 PM

## 2020-01-10 NOTE — Telephone Encounter (Signed)
Remote ICM transmission received.  Attempted call to patient regarding ICM remote transmission and call was disconnected after answered.

## 2020-01-16 ENCOUNTER — Other Ambulatory Visit: Payer: Self-pay | Admitting: Urology

## 2020-01-16 DIAGNOSIS — R972 Elevated prostate specific antigen [PSA]: Secondary | ICD-10-CM

## 2020-01-27 ENCOUNTER — Other Ambulatory Visit: Payer: Self-pay | Admitting: Internal Medicine

## 2020-01-29 ENCOUNTER — Ambulatory Visit (INDEPENDENT_AMBULATORY_CARE_PROVIDER_SITE_OTHER): Payer: Medicare Other

## 2020-01-29 ENCOUNTER — Telehealth: Payer: Self-pay

## 2020-01-29 DIAGNOSIS — I429 Cardiomyopathy, unspecified: Secondary | ICD-10-CM

## 2020-01-29 LAB — CUP PACEART REMOTE DEVICE CHECK
Battery Remaining Longevity: 18 mo
Battery Voltage: 2.91 V
Brady Statistic AP VP Percent: 47.79 %
Brady Statistic AP VS Percent: 0.06 %
Brady Statistic AS VP Percent: 51.9 %
Brady Statistic AS VS Percent: 0.25 %
Brady Statistic RA Percent Paced: 47.18 %
Brady Statistic RV Percent Paced: 98.17 %
Date Time Interrogation Session: 20211026063425
HighPow Impedance: 50 Ohm
HighPow Impedance: 60 Ohm
Implantable Lead Implant Date: 20060323
Implantable Lead Implant Date: 20060323
Implantable Lead Implant Date: 20110420
Implantable Lead Location: 753858
Implantable Lead Location: 753859
Implantable Lead Location: 753860
Implantable Lead Model: 4193
Implantable Lead Model: 5076
Implantable Lead Model: 7121
Implantable Pulse Generator Implant Date: 20160613
Lead Channel Impedance Value: 399 Ohm
Lead Channel Impedance Value: 399 Ohm
Lead Channel Impedance Value: 4047 Ohm
Lead Channel Impedance Value: 4047 Ohm
Lead Channel Impedance Value: 513 Ohm
Lead Channel Impedance Value: 532 Ohm
Lead Channel Pacing Threshold Amplitude: 0.75 V
Lead Channel Pacing Threshold Amplitude: 1 V
Lead Channel Pacing Threshold Amplitude: 1.875 V
Lead Channel Pacing Threshold Pulse Width: 0.4 ms
Lead Channel Pacing Threshold Pulse Width: 0.4 ms
Lead Channel Pacing Threshold Pulse Width: 0.8 ms
Lead Channel Sensing Intrinsic Amplitude: 25.25 mV
Lead Channel Sensing Intrinsic Amplitude: 25.25 mV
Lead Channel Sensing Intrinsic Amplitude: 3 mV
Lead Channel Sensing Intrinsic Amplitude: 3 mV
Lead Channel Setting Pacing Amplitude: 1.5 V
Lead Channel Setting Pacing Amplitude: 2 V
Lead Channel Setting Pacing Amplitude: 2.5 V
Lead Channel Setting Pacing Pulse Width: 0.4 ms
Lead Channel Setting Pacing Pulse Width: 0.8 ms
Lead Channel Setting Sensing Sensitivity: 0.6 mV

## 2020-01-29 NOTE — Telephone Encounter (Signed)
Received call from patient asking about remote transmission results.  Advised report suggesting possible fluid accumulation since 01/16/2020.  He and his wife are recovering from [place] and CT scan from 2 weeks ago showed he still has some lung congestion.  He is feeling better and getting his strength back. He has a follow up appointment with PCP in 2 weeks.  He confirms taking Furosemide as prescribed. Advised having COVID may contribute to possible fluid accumulation on report. Next ICM check 02/11/2020.

## 2020-02-04 NOTE — Progress Notes (Signed)
Remote ICD transmission.   

## 2020-02-11 ENCOUNTER — Ambulatory Visit (INDEPENDENT_AMBULATORY_CARE_PROVIDER_SITE_OTHER): Payer: Medicare Other

## 2020-02-11 DIAGNOSIS — I5042 Chronic combined systolic (congestive) and diastolic (congestive) heart failure: Secondary | ICD-10-CM | POA: Diagnosis not present

## 2020-02-11 DIAGNOSIS — Z95810 Presence of automatic (implantable) cardiac defibrillator: Secondary | ICD-10-CM

## 2020-02-15 ENCOUNTER — Telehealth: Payer: Self-pay

## 2020-02-15 NOTE — Telephone Encounter (Signed)
Remote ICM transmission received.  Attempted call to patient regarding ICM remote transmission and left detailed message per DPR.  Advised to return call for any fluid symptoms or questions. Next ICM remote transmission scheduled 03/17/2020.

## 2020-02-15 NOTE — Progress Notes (Signed)
EPIC Encounter for ICM Monitoring  Patient Name: Russell Fuentes is a 69 y.o. male Date: 02/15/2020 Primary Care Physican: [name], MD Primary Cardiologist:Klein Electrophysiologist:Klein Bi-V Pacing:98.5% 12/18/2019 Office Weight:258lbs   Attempted call to patient and unable to reach.  Transmission reviewed.   OptivolThoracic impedance normal.  Prescribed:  Furosemide 20 mgTake 1 tablet (20 mg total) by mouth every other day.  Spironolactone 25 mg take 0.5 tablet (12.5 mg total) by mouth daily  Labs: 12/27/2018 Creatinine 1.28, BUN 17, Potassium 4.8, Sodium 140, GFR 57-66  Recommendations: Unable to reach.    Follow-up plan: ICM clinic phone appointment on12/13/2021.91 day device clinic remote transmission1/25/2022.   EP/Cardiology Office Visits: Recall 06/15/2020 with Dr.Klein.   Copy of ICM check sent to [place].   3 month ICM trend: 02/11/2020    1 Year ICM trend:       Rosalene Billings, RN 02/15/2020 12:19 PM

## 2020-02-18 DIAGNOSIS — E119 Type 2 diabetes mellitus without complications: Secondary | ICD-10-CM | POA: Diagnosis not present

## 2020-02-18 DIAGNOSIS — Z961 Presence of intraocular lens: Secondary | ICD-10-CM | POA: Diagnosis not present

## 2020-02-21 DIAGNOSIS — E1122 Type 2 diabetes mellitus with diabetic chronic kidney disease: Secondary | ICD-10-CM | POA: Diagnosis not present

## 2020-02-21 DIAGNOSIS — Z8616 Personal history of COVID-19: Secondary | ICD-10-CM | POA: Diagnosis not present

## 2020-02-21 DIAGNOSIS — Z23 Encounter for immunization: Secondary | ICD-10-CM | POA: Diagnosis not present

## 2020-02-21 DIAGNOSIS — I429 Cardiomyopathy, unspecified: Secondary | ICD-10-CM | POA: Diagnosis not present

## 2020-02-21 DIAGNOSIS — I5022 Chronic systolic (congestive) heart failure: Secondary | ICD-10-CM | POA: Diagnosis not present

## 2020-02-21 DIAGNOSIS — N183 Chronic kidney disease, stage 3 unspecified: Secondary | ICD-10-CM | POA: Diagnosis not present

## 2020-02-21 DIAGNOSIS — I130 Hypertensive heart and chronic kidney disease with heart failure and stage 1 through stage 4 chronic kidney disease, or unspecified chronic kidney disease: Secondary | ICD-10-CM | POA: Diagnosis not present

## 2020-02-21 DIAGNOSIS — E782 Mixed hyperlipidemia: Secondary | ICD-10-CM | POA: Diagnosis not present

## 2020-03-12 DIAGNOSIS — R972 Elevated prostate specific antigen [PSA]: Secondary | ICD-10-CM | POA: Diagnosis not present

## 2020-03-17 ENCOUNTER — Ambulatory Visit (INDEPENDENT_AMBULATORY_CARE_PROVIDER_SITE_OTHER): Payer: Medicare Other

## 2020-03-17 DIAGNOSIS — Z95810 Presence of automatic (implantable) cardiac defibrillator: Secondary | ICD-10-CM

## 2020-03-17 DIAGNOSIS — I5042 Chronic combined systolic (congestive) and diastolic (congestive) heart failure: Secondary | ICD-10-CM

## 2020-03-19 DIAGNOSIS — R972 Elevated prostate specific antigen [PSA]: Secondary | ICD-10-CM | POA: Diagnosis not present

## 2020-03-19 DIAGNOSIS — N4340 Spermatocele of epididymis, unspecified: Secondary | ICD-10-CM | POA: Diagnosis not present

## 2020-03-19 DIAGNOSIS — N401 Enlarged prostate with lower urinary tract symptoms: Secondary | ICD-10-CM | POA: Diagnosis not present

## 2020-03-19 DIAGNOSIS — R351 Nocturia: Secondary | ICD-10-CM | POA: Diagnosis not present

## 2020-03-19 NOTE — Progress Notes (Signed)
EPIC Encounter for ICM Monitoring  Patient Name: Russell Fuentes is a 69 y.o. male Date: 03/19/2020 Primary Care Physican: [name], MD Primary Cardiologist:Klein Electrophysiologist:Klein Bi-V Pacing:98.8% 9/14/2021OfficeWeight:258lbs   Transmission reviewed.  OptivolThoracic impedance normal.  Prescribed:  Furosemide 20 mgTake 1 tablet (20 mg total) by mouthevery other day.  Spironolactone 25 mg take 0.5 tablet (12.5 mg total) by mouth daily  Labs: 12/27/2018 Creatinine 1.28, BUN 17, Potassium 4.8, Sodium 140, GFR 57-66  Recommendations:No changes  Follow-up plan: ICM clinic phone appointment on2/04/2020.91 day device clinic remote transmission1/25/2022.   EP/Cardiology Office Visits: Recall 3/13/2022with Dr.Klein.   Copy of ICM check sent to [place].   3 month ICM trend: 03/17/2020    1 Year ICM trend:       Rosalene Billings, RN 03/19/2020 9:34 AM

## 2020-04-10 DIAGNOSIS — L820 Inflamed seborrheic keratosis: Secondary | ICD-10-CM | POA: Diagnosis not present

## 2020-04-10 DIAGNOSIS — L578 Other skin changes due to chronic exposure to nonionizing radiation: Secondary | ICD-10-CM | POA: Diagnosis not present

## 2020-04-10 DIAGNOSIS — L570 Actinic keratosis: Secondary | ICD-10-CM | POA: Diagnosis not present

## 2020-04-10 DIAGNOSIS — L821 Other seborrheic keratosis: Secondary | ICD-10-CM | POA: Diagnosis not present

## 2020-04-10 DIAGNOSIS — D2371 Other benign neoplasm of skin of right lower limb, including hip: Secondary | ICD-10-CM | POA: Diagnosis not present

## 2020-04-10 DIAGNOSIS — D1801 Hemangioma of skin and subcutaneous tissue: Secondary | ICD-10-CM | POA: Diagnosis not present

## 2020-04-10 DIAGNOSIS — L812 Freckles: Secondary | ICD-10-CM | POA: Diagnosis not present

## 2020-04-10 DIAGNOSIS — Z85828 Personal history of other malignant neoplasm of skin: Secondary | ICD-10-CM | POA: Diagnosis not present

## 2020-04-29 ENCOUNTER — Ambulatory Visit (INDEPENDENT_AMBULATORY_CARE_PROVIDER_SITE_OTHER): Payer: Medicare Other

## 2020-04-29 DIAGNOSIS — I429 Cardiomyopathy, unspecified: Secondary | ICD-10-CM | POA: Diagnosis not present

## 2020-04-29 LAB — CUP PACEART REMOTE DEVICE CHECK
Battery Remaining Longevity: 16 mo
Battery Voltage: 2.9 V
Brady Statistic AP VP Percent: 47.15 %
Brady Statistic AP VS Percent: 0.06 %
Brady Statistic AS VP Percent: 52.69 %
Brady Statistic AS VS Percent: 0.09 %
Brady Statistic RA Percent Paced: 47 %
Brady Statistic RV Percent Paced: 99.31 %
Date Time Interrogation Session: 20220125001803
HighPow Impedance: 57 Ohm
HighPow Impedance: 71 Ohm
Implantable Lead Implant Date: 20060323
Implantable Lead Implant Date: 20060323
Implantable Lead Implant Date: 20110420
Implantable Lead Location: 753858
Implantable Lead Location: 753859
Implantable Lead Location: 753860
Implantable Lead Model: 4193
Implantable Lead Model: 5076
Implantable Lead Model: 7121
Implantable Pulse Generator Implant Date: 20160613
Lead Channel Impedance Value: 361 Ohm
Lead Channel Impedance Value: 4047 Ohm
Lead Channel Impedance Value: 4047 Ohm
Lead Channel Impedance Value: 456 Ohm
Lead Channel Impedance Value: 513 Ohm
Lead Channel Impedance Value: 532 Ohm
Lead Channel Pacing Threshold Amplitude: 0.75 V
Lead Channel Pacing Threshold Amplitude: 1 V
Lead Channel Pacing Threshold Amplitude: 1.375 V
Lead Channel Pacing Threshold Pulse Width: 0.4 ms
Lead Channel Pacing Threshold Pulse Width: 0.4 ms
Lead Channel Pacing Threshold Pulse Width: 0.8 ms
Lead Channel Sensing Intrinsic Amplitude: 18 mV
Lead Channel Sensing Intrinsic Amplitude: 18 mV
Lead Channel Sensing Intrinsic Amplitude: 2.5 mV
Lead Channel Sensing Intrinsic Amplitude: 2.5 mV
Lead Channel Setting Pacing Amplitude: 1.5 V
Lead Channel Setting Pacing Amplitude: 2 V
Lead Channel Setting Pacing Amplitude: 2.5 V
Lead Channel Setting Pacing Pulse Width: 0.4 ms
Lead Channel Setting Pacing Pulse Width: 0.8 ms
Lead Channel Setting Sensing Sensitivity: 0.6 mV

## 2020-05-05 DIAGNOSIS — N411 Chronic prostatitis: Secondary | ICD-10-CM | POA: Diagnosis not present

## 2020-05-05 DIAGNOSIS — R972 Elevated prostate specific antigen [PSA]: Secondary | ICD-10-CM | POA: Diagnosis not present

## 2020-05-06 ENCOUNTER — Ambulatory Visit (INDEPENDENT_AMBULATORY_CARE_PROVIDER_SITE_OTHER): Payer: Medicare Other

## 2020-05-06 DIAGNOSIS — Z95810 Presence of automatic (implantable) cardiac defibrillator: Secondary | ICD-10-CM

## 2020-05-06 DIAGNOSIS — I5042 Chronic combined systolic (congestive) and diastolic (congestive) heart failure: Secondary | ICD-10-CM

## 2020-05-09 NOTE — Progress Notes (Signed)
EPIC Encounter for ICM Monitoring  Patient Name: Russell Fuentes is a 70 y.o. male Date: 05/09/2020 Primary Care Physican: [name], MD Primary Cardiologist:Klein Electrophysiologist:Klein Bi-V Pacing:99.3% 05/09/2020 Weight:252lbs   Spoke with patient and reports feeling well at this time.  Denies fluid symptoms.    OptivolThoracic impedance normal.  Prescribed:  Furosemide 20 mgTake 1 tablet (20 mg total) by mouthevery other day.  Spironolactone 25 mg take 0.5 tablet (12.5 mg total) by mouth daily  Labs: 02/21/2020 Creatinine 1.21, BUN 15, Potassium 4.0, Sodium 142, GFR 59-72  A complete set of results can be found in Results Review.  Recommendations:No changes and encouraged to call if experiencing any fluid symptoms.  Follow-up plan: ICM clinic phone appointment on3/10/2020.91 day device clinic remote transmission4/26/2022.   EP/Cardiology Office Visits: Recall 3/13/2022with Dr.Klein.   Copy of ICM check sent to [place].   3 month ICM trend: 05/06/2020.    1 Year ICM trend:       Rosalene Billings, RN 05/09/2020 1:31 PM

## 2020-05-10 NOTE — Progress Notes (Signed)
Remote ICD transmission.   

## 2020-06-01 DIAGNOSIS — J029 Acute pharyngitis, unspecified: Secondary | ICD-10-CM | POA: Diagnosis not present

## 2020-06-01 DIAGNOSIS — Z20828 Contact with and (suspected) exposure to other viral communicable diseases: Secondary | ICD-10-CM | POA: Diagnosis not present

## 2020-06-01 DIAGNOSIS — J0100 Acute maxillary sinusitis, unspecified: Secondary | ICD-10-CM | POA: Diagnosis not present

## 2020-06-09 ENCOUNTER — Ambulatory Visit (INDEPENDENT_AMBULATORY_CARE_PROVIDER_SITE_OTHER): Payer: Medicare Other

## 2020-06-09 DIAGNOSIS — Z95810 Presence of automatic (implantable) cardiac defibrillator: Secondary | ICD-10-CM | POA: Diagnosis not present

## 2020-06-09 DIAGNOSIS — I5042 Chronic combined systolic (congestive) and diastolic (congestive) heart failure: Secondary | ICD-10-CM | POA: Diagnosis not present

## 2020-06-09 NOTE — Progress Notes (Signed)
EPIC Encounter for ICM Monitoring  Patient Name: Russell Fuentes is a 70 y.o. male Date: 06/09/2020 Primary Care Physican: [name], MD Primary Cardiologist:Klein Electrophysiologist:Klein Bi-V Pacing:99.0% 06/09/2020 Weight:250lbs   Spoke with patient and reports he has had some congestion the last couple of days.    OptivolThoracic impedance suggesting possible fluid accumulation starting 06/03/2020.  Prescribed:  Furosemide 20 mgTake 1 tablet (20 mg total) by mouthevery other day.  Spironolactone 25 mg take 0.5 tablet (12.5 mg total) by mouth daily  Labs: 02/21/2020 Creatinine 1.21, BUN 15, Potassium 4.0, Sodium 142, GFR 59-72  A complete set of results can be found in Results Review.  Recommendations: He will take extra Furosemide tomorrow on the off day.  Recommendation to limit salt intake.  Encouraged to call if experiencing any fluid symptoms.   Follow-up plan: ICM clinic phone appointment on3/23/2022 to recheck fluid levels.91 day device clinic remote transmission4/26/2022.   EP/Cardiology Office Visits: Recall 3/13/2022with Dr.Klein.   Copy of ICM check sent to [place].   3 month ICM trend: 06/09/2020.    1 Year ICM trend:       Rosalene Billings, RN 06/09/2020 12:14 PM

## 2020-06-25 ENCOUNTER — Ambulatory Visit (INDEPENDENT_AMBULATORY_CARE_PROVIDER_SITE_OTHER): Payer: Medicare Other

## 2020-06-25 ENCOUNTER — Telehealth: Payer: Self-pay

## 2020-06-25 DIAGNOSIS — Z95810 Presence of automatic (implantable) cardiac defibrillator: Secondary | ICD-10-CM

## 2020-06-25 DIAGNOSIS — I5042 Chronic combined systolic (congestive) and diastolic (congestive) heart failure: Secondary | ICD-10-CM

## 2020-06-25 NOTE — Telephone Encounter (Signed)
Remote ICM transmission received.  Attempted call to patient regarding ICM remote transmission and left detailed message per DPR.  Advised to return call for any fluid symptoms or questions.  

## 2020-06-25 NOTE — Progress Notes (Signed)
EPIC Encounter for ICM Monitoring  Patient Name: Russell Fuentes is a 70 y.o. male Date: 06/25/2020 Primary Care Physican: [name], MD Primary Cardiologist:Klein Electrophysiologist:Klein Bi-V Pacing:99.0% 3/7/2022Weight:250lbs   Attempted call to patient and unable to reach.  Left detailed message per DPR regarding transmission. Transmission reviewed.    OptivolThoracic impedance suggesting fluid levels returned to normal after taking extra Fuorsemide.  Prescribed:  Furosemide 20 mgTake 1 tablet (20 mg total) by mouthevery other day.  Spironolactone 25 mg take 0.5 tablet (12.5 mg total) by mouth daily  Labs: 02/21/2020 Creatinine1.21, BUN15, Potassium4.0, [id number], [id number] A complete set of results can be found in Results Review.  Recommendations: Left voice mail with ICM number and encouraged to call if experiencing any fluid symptoms.  Follow-up plan: ICM clinic phone appointment on4/27/2022.91 day device clinic remote transmission4/26/2022.   EP/Cardiology Office Visits: Recall 3/13/2022with Dr.Klein.   Copy of ICM check sent to [place].   3 month ICM trend: 06/25/2020.    1 Year ICM trend:       Rosalene Billings, RN 06/25/2020 1:44 PM

## 2020-07-29 ENCOUNTER — Ambulatory Visit (INDEPENDENT_AMBULATORY_CARE_PROVIDER_SITE_OTHER): Payer: Medicare Other

## 2020-07-29 DIAGNOSIS — I429 Cardiomyopathy, unspecified: Secondary | ICD-10-CM

## 2020-07-29 LAB — CUP PACEART REMOTE DEVICE CHECK
Battery Remaining Longevity: 13 mo
Battery Voltage: 2.88 V
Brady Statistic AP VP Percent: 46.36 %
Brady Statistic AP VS Percent: 0.07 %
Brady Statistic AS VP Percent: 53.45 %
Brady Statistic AS VS Percent: 0.11 %
Brady Statistic RA Percent Paced: 46.2 %
Brady Statistic RV Percent Paced: 99.21 %
Date Time Interrogation Session: 20220426001704
HighPow Impedance: 57 Ohm
HighPow Impedance: 74 Ohm
Implantable Lead Implant Date: 20060323
Implantable Lead Implant Date: 20060323
Implantable Lead Implant Date: 20110420
Implantable Lead Location: 753858
Implantable Lead Location: 753859
Implantable Lead Location: 753860
Implantable Lead Model: 4193
Implantable Lead Model: 5076
Implantable Lead Model: 7121
Implantable Pulse Generator Implant Date: 20160613
Lead Channel Impedance Value: 285 Ohm
Lead Channel Impedance Value: 4047 Ohm
Lead Channel Impedance Value: 4047 Ohm
Lead Channel Impedance Value: 418 Ohm
Lead Channel Impedance Value: 418 Ohm
Lead Channel Impedance Value: 551 Ohm
Lead Channel Pacing Threshold Amplitude: 0.75 V
Lead Channel Pacing Threshold Amplitude: 0.75 V
Lead Channel Pacing Threshold Amplitude: 1.375 V
Lead Channel Pacing Threshold Pulse Width: 0.4 ms
Lead Channel Pacing Threshold Pulse Width: 0.4 ms
Lead Channel Pacing Threshold Pulse Width: 0.8 ms
Lead Channel Sensing Intrinsic Amplitude: 25.625 mV
Lead Channel Sensing Intrinsic Amplitude: 25.625 mV
Lead Channel Sensing Intrinsic Amplitude: 3 mV
Lead Channel Sensing Intrinsic Amplitude: 3 mV
Lead Channel Setting Pacing Amplitude: 1.5 V
Lead Channel Setting Pacing Amplitude: 2 V
Lead Channel Setting Pacing Amplitude: 2.5 V
Lead Channel Setting Pacing Pulse Width: 0.4 ms
Lead Channel Setting Pacing Pulse Width: 0.8 ms
Lead Channel Setting Sensing Sensitivity: 0.6 mV

## 2020-07-30 ENCOUNTER — Ambulatory Visit (INDEPENDENT_AMBULATORY_CARE_PROVIDER_SITE_OTHER): Payer: Medicare Other

## 2020-07-30 DIAGNOSIS — I5042 Chronic combined systolic (congestive) and diastolic (congestive) heart failure: Secondary | ICD-10-CM | POA: Diagnosis not present

## 2020-07-30 DIAGNOSIS — Z95810 Presence of automatic (implantable) cardiac defibrillator: Secondary | ICD-10-CM | POA: Diagnosis not present

## 2020-08-01 NOTE — Progress Notes (Signed)
EPIC Encounter for ICM Monitoring  Patient Name: Russell Fuentes is a 70 y.o. male Date: 08/01/2020 Primary Care Physican: [name], MD Primary Cardiologist:Klein Electrophysiologist:Klein Bi-V Pacing:99.2% 3/7/2022Weight:250lbs   Transmission reviewed.   OptivolThoracic impedancesuggesting normal fluid levels.  Prescribed:  Furosemide 20 mgTake 1 tablet (20 mg total) by mouthevery other day.  Spironolactone 25 mg take 0.5 tablet (12.5 mg total) by mouth daily  Labs: 02/21/2020 Creatinine1.21, BUN15, Potassium4.0, [id number], [id number] A complete set of results can be found in Results Review.  Recommendations:No changes.  Follow-up plan: ICM clinic phone appointment on6/09/2020.91 day device clinic remote transmission7/26/2022.   EP/Cardiology Office Visits: Recall 3/13/2022with Dr.Klein.   Copy of ICM check sent to [place].  3 month ICM trend: 07/29/2020.    1 Year ICM trend:       Rosalene Billings, RN 08/01/2020 12:09 PM

## 2020-08-20 NOTE — Progress Notes (Signed)
Remote ICD transmission.   

## 2020-08-21 DIAGNOSIS — E782 Mixed hyperlipidemia: Secondary | ICD-10-CM | POA: Diagnosis not present

## 2020-08-21 DIAGNOSIS — E1122 Type 2 diabetes mellitus with diabetic chronic kidney disease: Secondary | ICD-10-CM | POA: Diagnosis not present

## 2020-08-21 DIAGNOSIS — N183 Chronic kidney disease, stage 3 unspecified: Secondary | ICD-10-CM | POA: Diagnosis not present

## 2020-08-21 DIAGNOSIS — Z0000 Encounter for general adult medical examination without abnormal findings: Secondary | ICD-10-CM | POA: Diagnosis not present

## 2020-08-21 DIAGNOSIS — I130 Hypertensive heart and chronic kidney disease with heart failure and stage 1 through stage 4 chronic kidney disease, or unspecified chronic kidney disease: Secondary | ICD-10-CM | POA: Diagnosis not present

## 2020-08-21 DIAGNOSIS — M5459 Other low back pain: Secondary | ICD-10-CM | POA: Diagnosis not present

## 2020-08-21 DIAGNOSIS — I429 Cardiomyopathy, unspecified: Secondary | ICD-10-CM | POA: Diagnosis not present

## 2020-08-21 DIAGNOSIS — J309 Allergic rhinitis, unspecified: Secondary | ICD-10-CM | POA: Diagnosis not present

## 2020-08-21 DIAGNOSIS — I5022 Chronic systolic (congestive) heart failure: Secondary | ICD-10-CM | POA: Diagnosis not present

## 2020-09-08 ENCOUNTER — Ambulatory Visit (INDEPENDENT_AMBULATORY_CARE_PROVIDER_SITE_OTHER): Payer: Medicare Other

## 2020-09-08 DIAGNOSIS — Z95810 Presence of automatic (implantable) cardiac defibrillator: Secondary | ICD-10-CM | POA: Diagnosis not present

## 2020-09-08 DIAGNOSIS — I5042 Chronic combined systolic (congestive) and diastolic (congestive) heart failure: Secondary | ICD-10-CM | POA: Diagnosis not present

## 2020-09-12 ENCOUNTER — Telehealth: Payer: Self-pay

## 2020-09-12 NOTE — Progress Notes (Signed)
EPIC Encounter for ICM Monitoring  Patient Name: ESTEVEN OVERFELT is a 70 y.o. male Date: 09/12/2020 Primary Care Physican: [name], MD Primary Cardiologist: [name] Electrophysiologist: Vergie Living Pacing: 98.7%         06/09/2020 Weight: 250 lbs                                                          Attempted call to patient and unable to reach.  Left detailed message per DPR regarding transmission. Transmission reviewed.    Optivol Thoracic impedance suggesting normal fluid levels.     Prescribed:  Furosemide 20 mg Take 1 tablet (20 mg total) by mouth every other day. Spironolactone 25 mg take 0.5 tablet (12.5 mg total) by mouth daily   Labs: 02/21/2020 Creatinine 1.21, BUN 15, Potassium 4.0, Sodium 142, GFR 59-72  A complete set of results can be found in Results Review.   Recommendations: Left voice mail with ICM number and encouraged to call if experiencing any fluid symptoms.   Follow-up plan: ICM clinic phone appointment on 10/20/2020. 91 day device clinic remote transmission 10/28/2020.       EP/Cardiology Office Visits:  6/202022 with Dr. [name].     Copy of ICM check sent to Dr. [name].   3 month ICM trend: 09/08/2020.    1 Year ICM trend:       Rosalene Billings, RN 09/12/2020 8:38 AM

## 2020-09-12 NOTE — Telephone Encounter (Signed)
Remote ICM transmission received.  Attempted call to patient regarding ICM remote transmission and left detailed message per DPR.  Advised to return call for any fluid symptoms or questions. Next ICM remote transmission scheduled 10/20/2020.

## 2020-09-22 ENCOUNTER — Ambulatory Visit (INDEPENDENT_AMBULATORY_CARE_PROVIDER_SITE_OTHER): Payer: Medicare Other | Admitting: Internal Medicine

## 2020-09-22 ENCOUNTER — Other Ambulatory Visit: Payer: Self-pay

## 2020-09-22 ENCOUNTER — Encounter: Payer: Self-pay | Admitting: Internal Medicine

## 2020-09-22 VITALS — BP 124/68 | HR 76 | Ht 71.0 in | Wt 254.8 lb

## 2020-09-22 DIAGNOSIS — Z95810 Presence of automatic (implantable) cardiac defibrillator: Secondary | ICD-10-CM | POA: Diagnosis not present

## 2020-09-22 DIAGNOSIS — I5042 Chronic combined systolic (congestive) and diastolic (congestive) heart failure: Secondary | ICD-10-CM | POA: Diagnosis not present

## 2020-09-22 DIAGNOSIS — I428 Other cardiomyopathies: Secondary | ICD-10-CM

## 2020-09-22 NOTE — Progress Notes (Signed)
Patient ID: Russell Fuentes, male   DOB: [birth year], 70 y.o.   MRN: [id number]      Patient Care Team: [name], MD as PCP - General (Family Medicine) [name], MD as PCP - Electrophysiology (Cardiology)   HPI  Russell Fuentes is a 70 y.o. male Seen in followup for an ICD  CRT Medtronic implanted for nonischemic cardiomyopathy;  he underwent generator replacement 6/16   Today,the patient denies shortness of breath, orthopnea .  There have been no palpitations, lightheadedness or syncope.  Complains of chest pain upon awakening which resolves with moving around.  Has some LE edema    He have depleted his sodium intake and reduce his liquids due to LE edema ; 2 bottles of water and no more tham 2-3 Dr. Samson Frederic   He notes he sleeps on 3-4 pillows at night he assoc with helping his vertigo but not for breathing    He stay physically active in the woods chopping trees down   He notes  he have had 1 episode of vertigo this year 22 Tested covid +  Date Cr K Hgb  6/19 1.17 4.3 15.5  2/20 1.33 4.3    7/21 1.25 4.2 13.9        DATE TEST EF   11/14 Echo   55-65 %   9/15 LHC  No obstructive CAD  5/18 Echo   35 %   5/18 Echo  34  AV/VV optimization  8/19 Echo  35-40%   7/21 Echo  40-45%          Past Medical History:  Diagnosis Date   CHF (congestive heart failure) (HCC)    Chronic combined systolic and diastolic heart failure ([place])     Diabetes mellitus with neuropathy ([place])     Hypertension    Nonischemic cardiomyopathy ([place])     a. s/p MDT CRTD   Orthostatic lightheadedness      Past Surgical History:  Procedure Laterality Date   CARDIAC CATHETERIZATION  12/31/1998   Bi-plane cine left ventriculography revealed low-normal left ventricular function with an ejection fraction in the 45% to 50% range.   CORONARY ARTERY BYPASS GRAFT     DOPPLER ECHOCARDIOGRAPHY  04/28/2011   LVEF 41% by simpson's borderline concentric LVH, global hypokinesis with regional  variation. Stage 1 diastolic dysfunction, normal LV filling pressure. Normal LA size. Right heart pacing wires noted. Trace MR, TR. Normal RVSP   EP IMPLANTABLE DEVICE N/A 09/16/2014   Procedure: ICD Generator Changeout;  Surgeon: [name], MD;  Location: [facility];  Service: Cardiovascular;  Laterality: N/A;   LEFT HEART CATHETERIZATION WITH CORONARY ANGIOGRAM N/A 12/11/2013   Procedure: LEFT HEART CATHETERIZATION WITH CORONARY ANGIOGRAM;  Surgeon: [name], MD;  Location: [facility] CATH LAB;  Service: Cardiovascular;  Laterality: N/A;   NM MYOCAR PERF EJECTION FRACTION  04/03/2009, 11/18/2005   The post stress myocardial perfusion images show a normal pattern of perfusion in all regions. The post-stress ejection fraction is 50%. No significant wall motion abnormalities noted. This is a low risk scan.    Current Outpatient Medications  Medication Sig Dispense Refill   aspirin EC 81 MG tablet Take 81 mg by mouth daily. Swallow whole.     carvedilol (COREG) 25 MG tablet TAKE 1 TABLET BY MOUTH TWICE DAILY WITH A MEAL 180 tablet 3   furosemide (LASIX) 20 MG tablet Every other day (3x per week) 63 tablet 3   glimepiride (AMARYL) 4  MG tablet Take 4 mg by mouth daily with breakfast.     LOVASTATIN PO Take 20 mg by mouth daily.     meclizine (ANTIVERT) 25 MG tablet Take 1 tablet (25 mg total) by mouth 3 (three) times daily as needed for dizziness. 12 tablet 0   spironolactone (ALDACTONE) 25 MG tablet TAKE 1/2 TABLET(12.5 MG) BY MOUTH DAILY 45 tablet 3   valsartan (DIOVAN) 80 MG tablet TAKE 1 TABLET(80 MG) BY MOUTH DAILY 90 tablet 3   No current facility-administered medications for this visit.    Allergies  Allergen Reactions   Entresto [Sacubitril-Valsartan]     Fatigue, joint pain. Ok on valsartan alone   Metformin Hcl Other (See Comments)   Codeine Rash    Review of Systems negative except from HPI and PMH  Physical Exam: BP 124/68   Pulse 76   Ht 5\' 11"  (1.803 m)   Wt  254 lb 12.8 oz (115.6 kg)   SpO2 93%   BMI 35.54 kg/m  Well developed and Morbidly obese in no acute distress HENT normal Neck supple with JVP-5 Lungs Clear Regular rate and rhythm, no  gallop No  murmur Abd-soft with active BS No Clubbing cyanosis No edema Skin-warm and dry A & Oriented  Grossly she is to sensory and motor function  EKG sinus with P synchronous pacing.  Initial ECG showed QRS morphology that was negative in lead V I and positive in lead I.  Reviewing tracings back to 9/21 they were upright in lead V1 and negative in lead I  We will continue up again it was biphasic in lead V1 and negative in lead I.  We increased the LV offset from 0---20 ms with improvement in the operating QRS morphology lead V1   Assessment and  Plan  Nonischemic cardiomyopathy-   Congestive heart failure-acute/chronic-diastolic  Diabetes  Implantable defibrillator-Medtronic- CRT   High Risk Medication Surveillance Aldactone  Vertigo  Vaccination status negative    Pt heart failure status is fine and he is stable. Continue furosemide 20 mg 3 days a week .  Electrolytes are stable.  With his diabetes have asked him to follow-up with his PCP to see if we can get him on an SGLT2 therapy I have reached out to her directly.  With his cardiomyopathy, we will continue him on his Aldactone at 12.5 mg and his Diovan at 80 mg.  And carvedilol 25 mg twice daily   he needs a metabolic profile to check potassium levels  He ended up with COVID, not vaccinated.  Tolerated well thankfully.  Device is approaching ERI within the year.       I,Stephanie Williams,acting as a Education administrator for [name], MD.,have documented all relevant documentation on the behalf of [name], MD,as directed by  [name], MD while in the presence of [name], MD.  I, [name], MD, have reviewed all documentation for this visit. The documentation on 09/22/20 for the exam, diagnosis, procedures, and  orders are all accurate and complete.

## 2020-09-22 NOTE — Patient Instructions (Signed)
Medication Instructions:  Your physician recommends that you continue on your current medications as directed. Please refer to the Current Medication list given to you today.  Dr [name] has contacted your PCP about starting an SGLT2 medication.  *If you need a refill on your cardiac medications before your next appointment, please call your pharmacy*   Lab Work:  BMET - please have PCP to draw lab  If you have labs (blood work) drawn today and your tests are completely normal, you will receive your results only by: [place] (if you have MyChart) OR A paper copy in the mail If you have any lab test that is abnormal or we need to change your treatment, we will call you to review the results.   Testing/Procedures: None ordered.    Follow-Up: At [facility], you and your health needs are our priority.  As part of our continuing mission to provide you with exceptional heart care, we have created designated Provider Care Teams.  These Care Teams include your primary Cardiologist (physician) and Advanced Practice Providers (APPs -  Physician Assistants and Nurse Practitioners) who all work together to provide you with the care you need, when you need it.  We recommend signing up for the patient portal called "MyChart".  Sign up information is provided on this After Visit Summary.  MyChart is used to connect with patients for Virtual Visits (Telemedicine).  Patients are able to view lab/test results, encounter notes, upcoming appointments, etc.  Non-urgent messages can be sent to your provider as well.   To learn more about what you can do with MyChart, go to [email or web address].    Your next appointment:   6 month(s)  The format for your next appointment:   In Person  Provider:   [name], MD

## 2020-10-04 DIAGNOSIS — H9190 Unspecified hearing loss, unspecified ear: Secondary | ICD-10-CM | POA: Diagnosis not present

## 2020-10-14 DIAGNOSIS — Z85828 Personal history of other malignant neoplasm of skin: Secondary | ICD-10-CM | POA: Diagnosis not present

## 2020-10-14 DIAGNOSIS — L570 Actinic keratosis: Secondary | ICD-10-CM | POA: Diagnosis not present

## 2020-10-14 DIAGNOSIS — L821 Other seborrheic keratosis: Secondary | ICD-10-CM | POA: Diagnosis not present

## 2020-10-14 DIAGNOSIS — D1801 Hemangioma of skin and subcutaneous tissue: Secondary | ICD-10-CM | POA: Diagnosis not present

## 2020-10-18 ENCOUNTER — Other Ambulatory Visit: Payer: Self-pay | Admitting: Internal Medicine

## 2020-10-20 ENCOUNTER — Ambulatory Visit (INDEPENDENT_AMBULATORY_CARE_PROVIDER_SITE_OTHER): Payer: Medicare Other

## 2020-10-20 DIAGNOSIS — Z95810 Presence of automatic (implantable) cardiac defibrillator: Secondary | ICD-10-CM | POA: Diagnosis not present

## 2020-10-20 DIAGNOSIS — I5042 Chronic combined systolic (congestive) and diastolic (congestive) heart failure: Secondary | ICD-10-CM

## 2020-10-24 NOTE — Progress Notes (Signed)
EPIC Encounter for ICM Monitoring  Patient Name: Russell Fuentes is a 70 y.o. male Date: 10/24/2020 Primary Care Physican: [name], MD Primary Cardiologist: [name] Electrophysiologist: Vergie Living Pacing: 98.8%         09/22/2020 Office Weight: 254 lbs                                                          Transmission reviewed.   Optivol Thoracic impedance suggesting normal fluid levels.     Prescribed:  Furosemide 20 mg Take 1 tablet (20 mg total) by mouth every other day. Spironolactone 25 mg take 0.5 tablet (12.5 mg total) by mouth daily   Labs: 02/21/2020 Creatinine 1.21, BUN 15, Potassium 4.0, Sodium 142, GFR 59-72  A complete set of results can be found in Results Review.   Recommendations: No changes.   Follow-up plan: ICM clinic phone appointment on 11/24/2020. 91 day device clinic remote transmission 10/28/2020.       EP/Cardiology Office Visits:  Recall 03/21/2021 with Dr. [name].     Copy of ICM check sent to Dr. [name].   3 month ICM trend: 10/24/2020.    1 Year ICM trend:       Rosalene Billings, RN 10/24/2020 4:19 PM

## 2020-10-28 ENCOUNTER — Ambulatory Visit (INDEPENDENT_AMBULATORY_CARE_PROVIDER_SITE_OTHER): Payer: Medicare Other

## 2020-10-28 DIAGNOSIS — I5042 Chronic combined systolic (congestive) and diastolic (congestive) heart failure: Secondary | ICD-10-CM | POA: Diagnosis not present

## 2020-10-28 DIAGNOSIS — I428 Other cardiomyopathies: Secondary | ICD-10-CM

## 2020-10-28 LAB — CUP PACEART REMOTE DEVICE CHECK
Battery Remaining Longevity: 10 mo
Battery Voltage: 2.86 V
Brady Statistic AP VP Percent: 50.45 %
Brady Statistic AP VS Percent: 0.08 %
Brady Statistic AS VP Percent: 48.68 %
Brady Statistic AS VS Percent: 0.79 %
Brady Statistic RA Percent Paced: 49.68 %
Brady Statistic RV Percent Paced: 97.52 %
Date Time Interrogation Session: 20220726042406
HighPow Impedance: 48 Ohm
HighPow Impedance: 61 Ohm
Implantable Lead Implant Date: 20060323
Implantable Lead Implant Date: 20060323
Implantable Lead Implant Date: 20110420
Implantable Lead Location: 753858
Implantable Lead Location: 753859
Implantable Lead Location: 753860
Implantable Lead Model: 4193
Implantable Lead Model: 5076
Implantable Lead Model: 7121
Implantable Pulse Generator Implant Date: 20160613
Lead Channel Impedance Value: 304 Ohm
Lead Channel Impedance Value: 399 Ohm
Lead Channel Impedance Value: 4047 Ohm
Lead Channel Impedance Value: 4047 Ohm
Lead Channel Impedance Value: 475 Ohm
Lead Channel Impedance Value: 513 Ohm
Lead Channel Pacing Threshold Amplitude: 0.625 V
Lead Channel Pacing Threshold Amplitude: 1.125 V
Lead Channel Pacing Threshold Amplitude: 1.25 V
Lead Channel Pacing Threshold Pulse Width: 0.4 ms
Lead Channel Pacing Threshold Pulse Width: 0.4 ms
Lead Channel Pacing Threshold Pulse Width: 0.8 ms
Lead Channel Sensing Intrinsic Amplitude: 15.25 mV
Lead Channel Sensing Intrinsic Amplitude: 15.25 mV
Lead Channel Sensing Intrinsic Amplitude: 4 mV
Lead Channel Sensing Intrinsic Amplitude: 4 mV
Lead Channel Setting Pacing Amplitude: 1.5 V
Lead Channel Setting Pacing Amplitude: 2.25 V
Lead Channel Setting Pacing Amplitude: 2.5 V
Lead Channel Setting Pacing Pulse Width: 0.4 ms
Lead Channel Setting Pacing Pulse Width: 0.8 ms
Lead Channel Setting Sensing Sensitivity: 0.6 mV

## 2020-11-04 DIAGNOSIS — R972 Elevated prostate specific antigen [PSA]: Secondary | ICD-10-CM | POA: Diagnosis not present

## 2020-11-12 DIAGNOSIS — R972 Elevated prostate specific antigen [PSA]: Secondary | ICD-10-CM | POA: Diagnosis not present

## 2020-11-12 DIAGNOSIS — N5201 Erectile dysfunction due to arterial insufficiency: Secondary | ICD-10-CM | POA: Diagnosis not present

## 2020-11-17 ENCOUNTER — Other Ambulatory Visit: Payer: Self-pay | Admitting: Internal Medicine

## 2020-11-24 ENCOUNTER — Ambulatory Visit (INDEPENDENT_AMBULATORY_CARE_PROVIDER_SITE_OTHER): Payer: Medicare Other

## 2020-11-24 DIAGNOSIS — I5042 Chronic combined systolic (congestive) and diastolic (congestive) heart failure: Secondary | ICD-10-CM

## 2020-11-24 DIAGNOSIS — Z95810 Presence of automatic (implantable) cardiac defibrillator: Secondary | ICD-10-CM | POA: Diagnosis not present

## 2020-11-24 NOTE — Progress Notes (Signed)
Remote ICD transmission.   

## 2020-11-28 ENCOUNTER — Telehealth: Payer: Self-pay

## 2020-11-28 NOTE — Progress Notes (Signed)
EPIC Encounter for ICM Monitoring  Patient Name: Russell Fuentes is a 70 y.o. male Date: 11/28/2020 Primary Care Physican: [name], MD Primary Cardiologist: [name] Electrophysiologist: Vergie Living Pacing: 95%         09/22/2020 Office Weight: 254 lbs                                                           Attempted call to patient and unable to reach.  Left detailed message per DPR regarding transmission. Transmission reviewed.    Optivol Thoracic impedance suggesting normal fluid levels.     Prescribed:  Furosemide 20 mg Take 1 tablet (20 mg total) by mouth every other day. Spironolactone 25 mg take 0.5 tablet (12.5 mg total) by mouth daily   Labs: 02/21/2020 Creatinine 1.21, BUN 15, Potassium 4.0, Sodium 142, GFR 59-72  A complete set of results can be found in Results Review.   Recommendations: Left voice mail with ICM number and encouraged to call if experiencing any fluid symptoms.   Follow-up plan: ICM clinic phone appointment on 01/05/2021.  91 day device clinic remote transmission 01/27/2021.       EP/Cardiology Office Visits:  Recall 03/21/2021 with Dr. [name].     Copy of ICM check sent to Dr. [name].   3 month ICM trend: 11/24/2020.    1 Year ICM trend:       Rosalene Billings, RN 11/28/2020 4:04 PM

## 2020-11-28 NOTE — Telephone Encounter (Signed)
Remote ICM transmission received.  Attempted call to patient regarding ICM remote transmission and left detailed message per DPR.  Advised to return call for any fluid symptoms or questions. Next ICM remote transmission scheduled 01/05/2021.

## 2020-12-31 DIAGNOSIS — Z23 Encounter for immunization: Secondary | ICD-10-CM | POA: Diagnosis not present

## 2021-01-05 ENCOUNTER — Ambulatory Visit (INDEPENDENT_AMBULATORY_CARE_PROVIDER_SITE_OTHER): Payer: Medicare Other

## 2021-01-05 DIAGNOSIS — I5042 Chronic combined systolic (congestive) and diastolic (congestive) heart failure: Secondary | ICD-10-CM

## 2021-01-05 DIAGNOSIS — Z95810 Presence of automatic (implantable) cardiac defibrillator: Secondary | ICD-10-CM | POA: Diagnosis not present

## 2021-01-09 NOTE — Progress Notes (Signed)
EPIC Encounter for ICM Monitoring  Patient Name: Russell Fuentes is a 70 y.o. male Date: 01/09/2021 Primary Care Physican: [name], MD Primary Cardiologist: [name] Electrophysiologist: Vergie Living Pacing: 96.6%         01/09/2021 Weight: 244 lbs  Time in AT/AF <0.1 hr/day (<0.1%)                                                            Spoke with patient and heart failure questions reviewed.  Pt asymptomatic for fluid accumulation and feeling well.  41 year old Granddaughter has a brain tumor.   Optivol Thoracic impedance suggesting normal fluid levels.     Prescribed:  Furosemide 20 mg Take 1 tablet (20 mg total) by mouth every other day. Spironolactone 25 mg take 0.5 tablet (12.5 mg total) by mouth daily   Labs: 02/21/2020 Creatinine 1.21, BUN 15, Potassium 4.0, Sodium 142, GFR 59-72  A complete set of results can be found in Results Review.   Recommendations:  No changes and encouraged to call if experiencing any fluid symptoms.   Follow-up plan: ICM clinic phone appointment on 02/09/2021.  91 day device clinic remote transmission 01/27/2021.       EP/Cardiology Office Visits:  Recall 03/21/2021 with Dr. [name].     Copy of ICM check sent to Dr. [name].    3 month ICM trend: 01/05/2021.    1 Year ICM trend:       Rosalene Billings, RN 01/09/2021 3:18 PM

## 2021-01-20 DIAGNOSIS — H903 Sensorineural hearing loss, bilateral: Secondary | ICD-10-CM | POA: Diagnosis not present

## 2021-01-20 DIAGNOSIS — Z974 Presence of external hearing-aid: Secondary | ICD-10-CM | POA: Diagnosis not present

## 2021-01-26 ENCOUNTER — Other Ambulatory Visit: Payer: Self-pay | Admitting: *Deleted

## 2021-01-26 DIAGNOSIS — I428 Other cardiomyopathies: Secondary | ICD-10-CM

## 2021-01-26 MED ORDER — FUROSEMIDE 20 MG PO TABS: ORAL_TABLET | ORAL | 3 refills | Status: DC

## 2021-01-27 ENCOUNTER — Ambulatory Visit (INDEPENDENT_AMBULATORY_CARE_PROVIDER_SITE_OTHER): Payer: Medicare Other

## 2021-01-27 DIAGNOSIS — I428 Other cardiomyopathies: Secondary | ICD-10-CM

## 2021-01-27 DIAGNOSIS — I5042 Chronic combined systolic (congestive) and diastolic (congestive) heart failure: Secondary | ICD-10-CM

## 2021-01-27 LAB — CUP PACEART REMOTE DEVICE CHECK
Battery Remaining Longevity: 7 mo
Battery Voltage: 2.84 V
Brady Statistic AP VP Percent: 51.07 %
Brady Statistic AP VS Percent: 0.08 %
Brady Statistic AS VP Percent: 47.26 %
Brady Statistic AS VS Percent: 1.59 %
Brady Statistic RA Percent Paced: 49.73 %
Brady Statistic RV Percent Paced: 95.82 %
Date Time Interrogation Session: 20221025044223
HighPow Impedance: 51 Ohm
HighPow Impedance: 67 Ohm
Implantable Lead Implant Date: 20060323
Implantable Lead Implant Date: 20060323
Implantable Lead Implant Date: 20110420
Implantable Lead Location: 753858
Implantable Lead Location: 753859
Implantable Lead Location: 753860
Implantable Lead Model: 4193
Implantable Lead Model: 5076
Implantable Lead Model: 7121
Implantable Pulse Generator Implant Date: 20160613
Lead Channel Impedance Value: 304 Ohm
Lead Channel Impedance Value: 4047 Ohm
Lead Channel Impedance Value: 4047 Ohm
Lead Channel Impedance Value: 418 Ohm
Lead Channel Impedance Value: 418 Ohm
Lead Channel Impedance Value: 551 Ohm
Lead Channel Pacing Threshold Amplitude: 0.625 V
Lead Channel Pacing Threshold Amplitude: 1 V
Lead Channel Pacing Threshold Amplitude: 2 V
Lead Channel Pacing Threshold Pulse Width: 0.4 ms
Lead Channel Pacing Threshold Pulse Width: 0.4 ms
Lead Channel Pacing Threshold Pulse Width: 0.8 ms
Lead Channel Sensing Intrinsic Amplitude: 26.25 mV
Lead Channel Sensing Intrinsic Amplitude: 26.25 mV
Lead Channel Sensing Intrinsic Amplitude: 3.25 mV
Lead Channel Sensing Intrinsic Amplitude: 3.25 mV
Lead Channel Setting Pacing Amplitude: 1.5 V
Lead Channel Setting Pacing Amplitude: 2.5 V
Lead Channel Setting Pacing Amplitude: 2.5 V
Lead Channel Setting Pacing Pulse Width: 0.4 ms
Lead Channel Setting Pacing Pulse Width: 0.8 ms
Lead Channel Setting Sensing Sensitivity: 0.6 mV

## 2021-01-28 ENCOUNTER — Telehealth: Payer: Self-pay | Admitting: Internal Medicine

## 2021-01-28 NOTE — Telephone Encounter (Signed)
Confirmed with pt that he is to be taking Furosemide 20mg  - 1 tablet by mouth every other day by mouth 3x per week and Spironolactone 25mg  - 1/2 tablet by mouth daily.  Pt verbalized understanding and thanked Therapist, sports for the call.

## 2021-01-28 NOTE — Telephone Encounter (Signed)
Pt c/o medication issue:  1. Name of Medication: furosemide (LASIX) 20 MG tablet; spironolactone (ALDACTONE) 25 MG tablet  2. How are you currently taking this medication (dosage and times per day)? Every other day (3x per week); TAKE 1/2 TABLET(12.5 MG) BY MOUTH DAILY  3. Are you having a reaction (difficulty breathing--STAT)? No   4. What is your medication issue? Patient wants to know if Dr. [name] still wants him to take the spironolactone while taking the furosemide.

## 2021-01-29 ENCOUNTER — Other Ambulatory Visit: Payer: Self-pay

## 2021-01-29 MED ORDER — SPIRONOLACTONE 25 MG PO TABS: ORAL_TABLET | ORAL | 2 refills | Status: DC

## 2021-02-04 NOTE — Progress Notes (Signed)
Remote ICD transmission.   

## 2021-02-09 ENCOUNTER — Ambulatory Visit (INDEPENDENT_AMBULATORY_CARE_PROVIDER_SITE_OTHER): Payer: Medicare Other

## 2021-02-09 DIAGNOSIS — Z95810 Presence of automatic (implantable) cardiac defibrillator: Secondary | ICD-10-CM | POA: Diagnosis not present

## 2021-02-09 DIAGNOSIS — I5042 Chronic combined systolic (congestive) and diastolic (congestive) heart failure: Secondary | ICD-10-CM | POA: Diagnosis not present

## 2021-02-13 ENCOUNTER — Telehealth: Payer: Self-pay

## 2021-02-13 NOTE — Progress Notes (Signed)
EPIC Encounter for ICM Monitoring  Patient Name: Russell Fuentes is a 70 y.o. male Date: 02/13/2021 Primary Care Physican: [name], MD Primary Cardiologist: [name] Electrophysiologist: Vergie Living Pacing: 90.7%         01/09/2021 Weight: 244 lbs   Time in AT/AF  0.0 hr/day (0.0%)                                                            Attempted call to patient and unable to reach.  Left detailed message per DPR regarding transmission. Transmission reviewed.   40 year old Granddaughter has a brain tumor.   Optivol Thoracic impedance suggesting normal fluid levels.     Prescribed:  Furosemide 20 mg Take 1 tablet (20 mg total) by mouth every other day. Spironolactone 25 mg take 0.5 tablet (12.5 mg total) by mouth daily   Labs: 02/21/2020 Creatinine 1.21, BUN 15, Potassium 4.0, Sodium 142, GFR 59-72  A complete set of results can be found in Results Review.   Recommendations:  Left voice mail with ICM number and encouraged to call if experiencing any fluid symptoms.   Follow-up plan: ICM clinic phone appointment on 03/16/2021.  91 day device clinic remote transmission 04/28/2021.       EP/Cardiology Office Visits:  Recall 03/21/2021 with Dr. [name].     Copy of ICM check sent to Dr. [name].    3 month ICM trend: 02/09/2021.    1 Year ICM trend:       Rosalene Billings, RN 02/13/2021 2:32 PM

## 2021-02-13 NOTE — Telephone Encounter (Signed)
Remote ICM transmission received.  Attempted call to patient regarding ICM remote transmission and left detailed message per DPR.  Advised to return call for any fluid symptoms or questions. Next ICM remote transmission scheduled 03/16/2021.

## 2021-02-23 DIAGNOSIS — Z961 Presence of intraocular lens: Secondary | ICD-10-CM | POA: Diagnosis not present

## 2021-02-23 DIAGNOSIS — E119 Type 2 diabetes mellitus without complications: Secondary | ICD-10-CM | POA: Diagnosis not present

## 2021-02-24 DIAGNOSIS — I130 Hypertensive heart and chronic kidney disease with heart failure and stage 1 through stage 4 chronic kidney disease, or unspecified chronic kidney disease: Secondary | ICD-10-CM | POA: Diagnosis not present

## 2021-02-24 DIAGNOSIS — E1122 Type 2 diabetes mellitus with diabetic chronic kidney disease: Secondary | ICD-10-CM | POA: Diagnosis not present

## 2021-02-24 DIAGNOSIS — E782 Mixed hyperlipidemia: Secondary | ICD-10-CM | POA: Diagnosis not present

## 2021-03-16 ENCOUNTER — Ambulatory Visit (INDEPENDENT_AMBULATORY_CARE_PROVIDER_SITE_OTHER): Payer: Medicare Other

## 2021-03-16 DIAGNOSIS — I5042 Chronic combined systolic (congestive) and diastolic (congestive) heart failure: Secondary | ICD-10-CM | POA: Diagnosis not present

## 2021-03-16 DIAGNOSIS — Z95810 Presence of automatic (implantable) cardiac defibrillator: Secondary | ICD-10-CM | POA: Diagnosis not present

## 2021-03-17 DIAGNOSIS — J019 Acute sinusitis, unspecified: Secondary | ICD-10-CM | POA: Diagnosis not present

## 2021-03-17 NOTE — Progress Notes (Addendum)
EPIC Encounter for ICM Monitoring  Patient Name: Russell Fuentes is a 70 y.o. male Date: 03/17/2021 Primary Care Physican: [name], MD Primary Cardiologist: [name] Electrophysiologist: Vergie Living Pacing: 96.7%         01/09/2021 Weight: 244 lbs   Time in AT/AF  0.0 hr/day (0.0%)                                                            Spoke with patient and heart failure questions reviewed.  Pt reports he thinks he has a little fluid and has been eating holiday foods high in salt.   67 year old Granddaughter has a brain tumor.   Optivol Thoracic impedance suggesting possible fluid accumulation since 03/11/2021.      Prescribed:  Furosemide 20 mg Take 1 tablet (20 mg total) by mouth every other day. Spironolactone 25 mg take 0.5 tablet (12.5 mg total) by mouth daily   Labs: 02/21/2020 Creatinine 1.21, BUN 15, Potassium 4.0, Sodium 142, GFR 59-72  A complete set of results can be found in Results Review.   Recommendations:  Advised to limit salt intake.  Will recheck fluid levels 12/16.     Follow-up plan: ICM clinic phone appointment on 03/20/2021 to recheck fluid levels.  91 day device clinic remote transmission 04/28/2021.       EP/Cardiology Office Visits:  Recall 03/21/2021 with Dr. [name].     Copy of ICM check sent to Dr. [name].    3 month ICM trend: 03/16/2021.    12-14 Month ICM trend:       Rosalene Billings, RN 03/17/2021 12:27 PM

## 2021-03-20 ENCOUNTER — Ambulatory Visit (INDEPENDENT_AMBULATORY_CARE_PROVIDER_SITE_OTHER): Payer: Medicare Other

## 2021-03-20 DIAGNOSIS — I5042 Chronic combined systolic (congestive) and diastolic (congestive) heart failure: Secondary | ICD-10-CM

## 2021-03-20 DIAGNOSIS — Z95810 Presence of automatic (implantable) cardiac defibrillator: Secondary | ICD-10-CM

## 2021-03-20 NOTE — Progress Notes (Signed)
EPIC Encounter for ICM Monitoring  Patient Name: Russell Fuentes is a 70 y.o. male Date: 03/20/2021 Primary Care Physican: [name], MD Primary Cardiologist: [name] Electrophysiologist: Vergie Living Pacing: 92.2%         01/09/2021 Weight: 244 lbs   Time in AT/AF  0.0 hr/day (0.0%)  Battery Longevity: 5 months                                                            Spoke with patient and heart failure questions reviewed.  Pt reports he continues to eat foods high in salt and taking Furosemide 3 times a week instead of every other day.   Optivol Thoracic impedance suggesting possible fluid accumulation since 03/11/2021.      Prescribed:  Furosemide 20 mg Take 1 tablet (20 mg total) by mouth every other day. Spironolactone 25 mg take 0.5 tablet (12.5 mg total) by mouth daily   Labs: 02/21/2020 Creatinine 1.21, BUN 15, Potassium 4.0, Sodium 142, GFR 59-72  A complete set of results can be found in Results Review.   Recommendations:  Advised to limit salt intake and take Furosemide every other day as prescribed.  Encouraged to call if he experiences any fluid symptoms.    Follow-up plan: ICM clinic phone appointment on 04/20/2021.  91 day device clinic remote transmission 04/28/2021.       EP/Cardiology Office Visits:  Recall 03/21/2021 with Dr. [name].     Copy of ICM check sent to Dr. [name].      3 month ICM trend: 03/20/2021.      Rosalene Billings, RN 03/20/2021 12:23 PM

## 2021-04-13 DIAGNOSIS — L570 Actinic keratosis: Secondary | ICD-10-CM | POA: Diagnosis not present

## 2021-04-13 DIAGNOSIS — D1801 Hemangioma of skin and subcutaneous tissue: Secondary | ICD-10-CM | POA: Diagnosis not present

## 2021-04-13 DIAGNOSIS — L853 Xerosis cutis: Secondary | ICD-10-CM | POA: Diagnosis not present

## 2021-04-13 DIAGNOSIS — L821 Other seborrheic keratosis: Secondary | ICD-10-CM | POA: Diagnosis not present

## 2021-04-13 DIAGNOSIS — L812 Freckles: Secondary | ICD-10-CM | POA: Diagnosis not present

## 2021-04-13 DIAGNOSIS — Z85828 Personal history of other malignant neoplasm of skin: Secondary | ICD-10-CM | POA: Diagnosis not present

## 2021-04-20 ENCOUNTER — Ambulatory Visit (INDEPENDENT_AMBULATORY_CARE_PROVIDER_SITE_OTHER): Payer: Medicare Other

## 2021-04-20 DIAGNOSIS — Z95810 Presence of automatic (implantable) cardiac defibrillator: Secondary | ICD-10-CM

## 2021-04-20 DIAGNOSIS — I5042 Chronic combined systolic (congestive) and diastolic (congestive) heart failure: Secondary | ICD-10-CM | POA: Diagnosis not present

## 2021-04-23 NOTE — Progress Notes (Signed)
EPIC Encounter for ICM Monitoring  Patient Name: Russell Fuentes is a 71 y.o. male Date: 04/23/2021 Primary Care Physican: [name], MD Primary Cardiologist: [name] Electrophysiologist: Vergie Living Pacing: 91.5%         04/23/2020 Weight: 237 lbs   Time in AT/AF  0.0 hr/day (0.0%)   Battery Longevity: 5 months                                                            Spoke with patient and heart failure questions reviewed.  Pt did have some dizziness and broke out in a sweat early morning x 2 days.    Optivol Thoracic impedance suggesting possible fluid accumulation starting 1/4 but trending back close to baseline.      Prescribed:  Furosemide 20 mg Take 1 tablet (20 mg total) by mouth every other day (3 x week). Spironolactone 25 mg take 0.5 tablet (12.5 mg total) by mouth daily Farxiga 10 mg take 1 tablet daily (samples from Dr Trena Platt office)   Labs: 02/21/2020 Creatinine 1.21, BUN 15, Potassium 4.0, Sodium 142, GFR 59-72  A complete set of results can be found in Results Review.   Recommendations:  No changes and encouraged to call if experiencing any fluid symptoms.    Follow-up plan: ICM clinic phone appointment on 05/25/2021.  91 day device clinic remote transmission 04/28/2021.       EP/Cardiology Office Visits:  Advised to call Dr Olin Pia office for routine visit.  Recall 03/21/2021 with Dr. [name].     Copy of ICM check sent to Dr. [name].      3 month ICM trend: 04/20/2021.    12-14 Month ICM trend:     Rosalene Billings, RN 04/23/2021 12:34 PM

## 2021-04-28 ENCOUNTER — Ambulatory Visit (INDEPENDENT_AMBULATORY_CARE_PROVIDER_SITE_OTHER): Payer: Medicare Other

## 2021-04-28 DIAGNOSIS — I428 Other cardiomyopathies: Secondary | ICD-10-CM | POA: Diagnosis not present

## 2021-04-28 LAB — CUP PACEART REMOTE DEVICE CHECK
Battery Remaining Longevity: 5 mo
Battery Voltage: 2.81 V
Brady Statistic AP VP Percent: 66.46 %
Brady Statistic AP VS Percent: 0.07 %
Brady Statistic AS VP Percent: 33.09 %
Brady Statistic AS VS Percent: 0.38 %
Brady Statistic RA Percent Paced: 63.16 %
Brady Statistic RV Percent Paced: 94.28 %
Date Time Interrogation Session: 20230124031703
HighPow Impedance: 51 Ohm
HighPow Impedance: 64 Ohm
Implantable Lead Implant Date: 20060323
Implantable Lead Implant Date: 20060323
Implantable Lead Implant Date: 20110420
Implantable Lead Location: 753858
Implantable Lead Location: 753859
Implantable Lead Location: 753860
Implantable Lead Model: 4193
Implantable Lead Model: 5076
Implantable Lead Model: 7121
Implantable Pulse Generator Implant Date: 20160613
Lead Channel Impedance Value: 342 Ohm
Lead Channel Impedance Value: 399 Ohm
Lead Channel Impedance Value: 4047 Ohm
Lead Channel Impedance Value: 4047 Ohm
Lead Channel Impedance Value: 418 Ohm
Lead Channel Impedance Value: 532 Ohm
Lead Channel Pacing Threshold Amplitude: 0.625 V
Lead Channel Pacing Threshold Amplitude: 1.125 V
Lead Channel Pacing Threshold Amplitude: 1.625 V
Lead Channel Pacing Threshold Pulse Width: 0.4 ms
Lead Channel Pacing Threshold Pulse Width: 0.4 ms
Lead Channel Pacing Threshold Pulse Width: 0.8 ms
Lead Channel Sensing Intrinsic Amplitude: 16.5 mV
Lead Channel Sensing Intrinsic Amplitude: 16.5 mV
Lead Channel Sensing Intrinsic Amplitude: 3 mV
Lead Channel Sensing Intrinsic Amplitude: 3 mV
Lead Channel Setting Pacing Amplitude: 1.5 V
Lead Channel Setting Pacing Amplitude: 2.25 V
Lead Channel Setting Pacing Amplitude: 2.5 V
Lead Channel Setting Pacing Pulse Width: 0.4 ms
Lead Channel Setting Pacing Pulse Width: 0.8 ms
Lead Channel Setting Sensing Sensitivity: 0.6 mV

## 2021-04-30 NOTE — Progress Notes (Signed)
Monthly battery checks already scheduled.

## 2021-05-04 DIAGNOSIS — R972 Elevated prostate specific antigen [PSA]: Secondary | ICD-10-CM | POA: Diagnosis not present

## 2021-05-08 NOTE — Progress Notes (Signed)
Remote ICD transmission.   

## 2021-05-10 NOTE — Progress Notes (Signed)
Cardiology Office Note Date:  05/10/2021  Patient ID:  Russell, Fuentes [birth year], MRN [id number] PCP:  [name], MD  Electrophysiologist: Dr. [name]     Chief Complaint:  6 mo  History of Present Illness: Russell Fuentes is a 71 y.o. male with history of DM, HTN, NICM, chronic CHF (systolic), ICD  He comes in today to be seen for Dr. [name], last seen by him June 2022, at that time, noted sleeping with 3 or so pillows for his vertigo, no SOB. He has CP when he first wakes that resolves once up and around. Active, reported with cutting wood. Device was nearing ERI Was going to reach out to his PMD about his DM and see if starting SGLT2 therapy would be OK. Meds unchanged otherwise.  TODAY He mentions since he last saw Dr. [name] he has had a little CP, "nothing big" Locates it to the L lower rib boarder, fairly random, lasts a couple minutes or less, if any pattern, more noted after a meal. Sometimes a deep breath or stretching can change how it feels. No associated symptoms Not exertional He is very active, walks regularly, always in the yard doing things weather permitting, no CP or exertional symptoms He denies near syncope or syncope, will occasionally get lightheaded with standing No SOB   Device information MDT CRT-D, 06/26/2003, new RV lead 07/23/2009, last gen change 09/16/2014 He has an abandoned RV/defib lead  Past Medical History:  Diagnosis Date   CHF (congestive heart failure) (HCC)    Chronic combined systolic and diastolic heart failure ([place])     Diabetes mellitus with neuropathy ([place])     Hypertension    Nonischemic cardiomyopathy ([place])     a. s/p MDT CRTD   Orthostatic lightheadedness      Past Surgical History:  Procedure Laterality Date   CARDIAC CATHETERIZATION  12/31/1998   Bi-plane cine left ventriculography revealed low-normal left ventricular function with an ejection fraction in the 45% to 50% range.   CORONARY ARTERY BYPASS GRAFT      DOPPLER ECHOCARDIOGRAPHY  04/28/2011   LVEF 41% by simpson's borderline concentric LVH, global hypokinesis with regional variation. Stage 1 diastolic dysfunction, normal LV filling pressure. Normal LA size. Right heart pacing wires noted. Trace MR, TR. Normal RVSP   EP IMPLANTABLE DEVICE N/A 09/16/2014   Procedure: ICD Generator Changeout;  Surgeon: [name], MD;  Location: [facility];  Service: Cardiovascular;  Laterality: N/A;   LEFT HEART CATHETERIZATION WITH CORONARY ANGIOGRAM N/A 12/11/2013   Procedure: LEFT HEART CATHETERIZATION WITH CORONARY ANGIOGRAM;  Surgeon: [name], MD;  Location: [facility];  Service: Cardiovascular;  Laterality: N/A;   NM MYOCAR PERF EJECTION FRACTION  04/03/2009, 11/18/2005   The post stress myocardial perfusion images show a normal pattern of perfusion in all regions. The post-stress ejection fraction is 50%. No significant wall motion abnormalities noted. This is a low risk scan.    Current Outpatient Medications  Medication Sig Dispense Refill   aspirin EC 81 MG tablet Take 81 mg by mouth daily. Swallow whole.     carvedilol (COREG) 25 MG tablet TAKE 1 TABLET BY MOUTH TWICE DAILY WITH A MEAL 180 tablet 3   furosemide (LASIX) 20 MG tablet Every other day (3x per week) 63 tablet 3   glimepiride (AMARYL) 4 MG tablet Take 4 mg by mouth daily with breakfast.     LOVASTATIN PO Take 20 mg by mouth daily.  meclizine (ANTIVERT) 25 MG tablet Take 1 tablet (25 mg total) by mouth 3 (three) times daily as needed for dizziness. 12 tablet 0   spironolactone (ALDACTONE) 25 MG tablet TAKE 1/2 TABLET(12.5 MG) BY MOUTH DAILY 45 tablet 2   valsartan (DIOVAN) 80 MG tablet TAKE 1 TABLET(80 MG) BY MOUTH DAILY 90 tablet 3   No current facility-administered medications for this visit.    Allergies:   Entresto [sacubitril-valsartan], Metformin hcl, and Codeine   Social History:  The patient  reports that he has never smoked. He has never used smokeless  tobacco. He reports that he does not drink alcohol.   Family History:  The patient's family history includes Asthma in his brother and sister; Cancer in his mother and sister; Heart attack in his father, mother, and sister; Hypertension in his brother.  ROS:  Please see the history of present illness.    All other systems are reviewed and otherwise negative.   PHYSICAL EXAM:  VS:  There were no vitals taken for this visit. BMI: There is no height or weight on file to calculate BMI. Well nourished, well developed, in no acute distress HEENT: normocephalic, atraumatic Neck: no JVD, carotid bruits or masses Cardiac:  RRR; no significant murmurs, no rubs, or gallops Lungs:  CTA b/l, no wheezing, rhonchi or rales Abd: soft, nontender MS: no deformity or atrophy Ext: no edema Skin: warm and dry, no rash Neuro:  No gross deficits appreciated Psych: euthymic mood, full affect  ICD site is stable, no tethering or discomfort   EKG:  Done today and reviewed by myself shows  SR, 77bpm, V pacing , QRS 152ms  Device interrogation done today and reviewed by myself:  Battery is 41mo to ERI Lead measurements are good No arrhythmias VP 95.3% (3.8% VS, PVCs), 99.5% BP Optivol looks good   10/23/2019: TTE IMPRESSIONS   1. Left ventricular ejection fraction, by estimation, is 40 to 45%. The  left ventricle has mildly decreased function. The left ventricle  demonstrates global hypokinesis. Left ventricular diastolic parameters are  indeterminate.   2. Right ventricular systolic function is normal. The right ventricular  size is normal. There is normal pulmonary artery systolic pressure.   3. The mitral valve is normal in structure. No evidence of mitral valve  regurgitation.   4. The aortic valve is tricuspid. Aortic valve regurgitation is not  visualized. No aortic stenosis is present.   Recent Labs: No results found for requested labs within last 8760 hours.  No results found for  requested labs within last 8760 hours.   CrCl cannot be calculated (Patient's most recent lab result is older than the maximum 21 days allowed.).   Wt Readings from Last 3 Encounters:  09/22/20 254 lb 12.8 oz (115.6 kg)  12/18/19 258 lb (117 kg)  10/24/19 261 lb (118.4 kg)     Other studies reviewed: Additional studies/records reviewed today include: summarized above  ASSESSMENT AND PLAN:  ICD Nearing ERI Will get an echo done ahead of gen change We discussed generator procedure, risks/benefits, he is agreeable to proceed  NICM Chronic CHF (systolic) Last echo 9379, EF improved to 40-45% No symptoms or exam findings of volume OL On BB, ARB, aldactone, farxiga, lasix Labs today  HTN Looks good Occasional orthostatic dizziness  5. CP Sounds atypical    Disposition: F/u with monthly remote battery checks, in clinic in 24mo, sooner if needed  Current medicines are reviewed at length with the patient today.  The patient did  not have any concerns regarding medicines.  Venetia Night, PA-C 05/10/2021 3:23 PM     [place] [address] [phone] (office)  [phone] (fax)

## 2021-05-13 DIAGNOSIS — R972 Elevated prostate specific antigen [PSA]: Secondary | ICD-10-CM | POA: Diagnosis not present

## 2021-05-14 ENCOUNTER — Other Ambulatory Visit: Payer: Self-pay

## 2021-05-14 ENCOUNTER — Ambulatory Visit (INDEPENDENT_AMBULATORY_CARE_PROVIDER_SITE_OTHER): Payer: Medicare Other | Admitting: Physician Assistant

## 2021-05-14 ENCOUNTER — Encounter: Payer: Self-pay | Admitting: Physician Assistant

## 2021-05-14 VITALS — BP 112/64 | HR 84 | Ht 70.0 in | Wt 243.0 lb

## 2021-05-14 DIAGNOSIS — Z79899 Other long term (current) drug therapy: Secondary | ICD-10-CM

## 2021-05-14 DIAGNOSIS — I428 Other cardiomyopathies: Secondary | ICD-10-CM

## 2021-05-14 DIAGNOSIS — I5042 Chronic combined systolic (congestive) and diastolic (congestive) heart failure: Secondary | ICD-10-CM

## 2021-05-14 DIAGNOSIS — Z95810 Presence of automatic (implantable) cardiac defibrillator: Secondary | ICD-10-CM | POA: Diagnosis not present

## 2021-05-14 DIAGNOSIS — I5022 Chronic systolic (congestive) heart failure: Secondary | ICD-10-CM

## 2021-05-14 DIAGNOSIS — I10 Essential (primary) hypertension: Secondary | ICD-10-CM

## 2021-05-14 LAB — CUP PACEART INCLINIC DEVICE CHECK
Battery Remaining Longevity: 4 mo
Battery Voltage: 2.78 V
Brady Statistic AP VP Percent: 53.05 %
Brady Statistic AP VS Percent: 0.07 %
Brady Statistic AS VP Percent: 45.91 %
Brady Statistic AS VS Percent: 0.97 %
Brady Statistic RA Percent Paced: 51.37 %
Brady Statistic RV Percent Paced: 95.35 %
Date Time Interrogation Session: 20230209182100
HighPow Impedance: 59 Ohm
HighPow Impedance: 80 Ohm
Implantable Lead Implant Date: 20060323
Implantable Lead Implant Date: 20060323
Implantable Lead Implant Date: 20110420
Implantable Lead Location: 753858
Implantable Lead Location: 753859
Implantable Lead Location: 753860
Implantable Lead Model: 4193
Implantable Lead Model: 5076
Implantable Lead Model: 7121
Implantable Pulse Generator Implant Date: 20160613
Lead Channel Impedance Value: 361 Ohm
Lead Channel Impedance Value: 4047 Ohm
Lead Channel Impedance Value: 4047 Ohm
Lead Channel Impedance Value: 418 Ohm
Lead Channel Impedance Value: 513 Ohm
Lead Channel Impedance Value: 589 Ohm
Lead Channel Pacing Threshold Amplitude: 0.75 V
Lead Channel Pacing Threshold Amplitude: 1 V
Lead Channel Pacing Threshold Amplitude: 1.625 V
Lead Channel Pacing Threshold Pulse Width: 0.4 ms
Lead Channel Pacing Threshold Pulse Width: 0.4 ms
Lead Channel Pacing Threshold Pulse Width: 0.8 ms
Lead Channel Sensing Intrinsic Amplitude: 18.25 mV
Lead Channel Sensing Intrinsic Amplitude: 21.25 mV
Lead Channel Sensing Intrinsic Amplitude: 3.75 mV
Lead Channel Sensing Intrinsic Amplitude: 4.625 mV
Lead Channel Setting Pacing Amplitude: 1.5 V
Lead Channel Setting Pacing Amplitude: 2.5 V
Lead Channel Setting Pacing Amplitude: 2.5 V
Lead Channel Setting Pacing Pulse Width: 0.4 ms
Lead Channel Setting Pacing Pulse Width: 0.8 ms
Lead Channel Setting Sensing Sensitivity: 0.6 mV

## 2021-05-14 LAB — BASIC METABOLIC PANEL
BUN/Creatinine Ratio: 16 (ref 10–24)
BUN: 21 mg/dL (ref 8–27)
CO2: 23 mmol/L (ref 20–29)
Calcium: 9.5 mg/dL (ref 8.6–10.2)
Chloride: 103 mmol/L (ref 96–106)
Creatinine, Ser: 1.31 mg/dL — ABNORMAL HIGH (ref 0.76–1.27)
Glucose: 220 mg/dL — ABNORMAL HIGH (ref 70–99)
Potassium: 4.2 mmol/L (ref 3.5–5.2)
Sodium: 138 mmol/L (ref 134–144)
eGFR: 58 mL/min/{1.73_m2} — ABNORMAL LOW (ref >59)

## 2021-05-14 LAB — MAGNESIUM: Magnesium: 2.3 mg/dL (ref 1.6–2.3)

## 2021-05-14 NOTE — Patient Instructions (Addendum)
Medication Instructions:   Your physician recommends that you continue on your current medications as directed. Please refer to the Current Medication list given to you today.   *If you need a refill on your cardiac medications before your next appointment, please call your pharmacy*   Lab Work:  BMET  AND [place]    If you have labs (blood work) drawn today and your tests are completely normal, you will receive your results only by: [place] (if you have MyChart) OR A paper copy in the mail If you have any lab test that is abnormal or we need to change your treatment, we will call you to review the results.   Testing/Procedures:  Your physician has requested that you have an echocardiogram. Echocardiography is a painless test that uses sound waves to create images of your heart. It provides your doctor with information about the size and shape of your heart and how well your hearts chambers and valves are working. This procedure takes approximately one hour. There are no restrictions for this procedure.    Follow-Up: At [facility], you and your health needs are our priority.  As part of our continuing mission to provide you with exceptional heart care, we have created designated Provider Care Teams.  These Care Teams include your primary Cardiologist (physician) and Advanced Practice Providers (APPs -  Physician Assistants and Nurse Practitioners) who all work together to provide you with the care you need, when you need it.  We recommend signing up for the patient portal called "MyChart".  Sign up information is provided on this After Visit Summary.  MyChart is used to connect with patients for Virtual Visits (Telemedicine).  Patients are able to view lab/test results, encounter notes, upcoming appointments, etc.  Non-urgent messages can be sent to your provider as well.   To learn more about what you can do with MyChart, go to [email or web address].    Your next  appointment:   4 month(s)  The format for your next appointment:   In Person  Provider:   [name], MD{     Other Instructions

## 2021-05-25 ENCOUNTER — Ambulatory Visit (INDEPENDENT_AMBULATORY_CARE_PROVIDER_SITE_OTHER): Payer: Medicare Other

## 2021-05-25 DIAGNOSIS — J324 Chronic pansinusitis: Secondary | ICD-10-CM | POA: Diagnosis not present

## 2021-05-25 DIAGNOSIS — Z95810 Presence of automatic (implantable) cardiac defibrillator: Secondary | ICD-10-CM | POA: Diagnosis not present

## 2021-05-25 DIAGNOSIS — Z20828 Contact with and (suspected) exposure to other viral communicable diseases: Secondary | ICD-10-CM | POA: Diagnosis not present

## 2021-05-25 DIAGNOSIS — H6693 Otitis media, unspecified, bilateral: Secondary | ICD-10-CM | POA: Diagnosis not present

## 2021-05-25 DIAGNOSIS — I5022 Chronic systolic (congestive) heart failure: Secondary | ICD-10-CM

## 2021-05-27 ENCOUNTER — Other Ambulatory Visit: Payer: Self-pay

## 2021-05-27 ENCOUNTER — Ambulatory Visit (HOSPITAL_COMMUNITY): Payer: Medicare Other | Attending: Cardiology

## 2021-05-27 DIAGNOSIS — I110 Hypertensive heart disease with heart failure: Secondary | ICD-10-CM | POA: Insufficient documentation

## 2021-05-27 DIAGNOSIS — I428 Other cardiomyopathies: Secondary | ICD-10-CM | POA: Diagnosis not present

## 2021-05-27 DIAGNOSIS — E119 Type 2 diabetes mellitus without complications: Secondary | ICD-10-CM | POA: Diagnosis not present

## 2021-05-27 DIAGNOSIS — Z95810 Presence of automatic (implantable) cardiac defibrillator: Secondary | ICD-10-CM | POA: Diagnosis not present

## 2021-05-27 DIAGNOSIS — I509 Heart failure, unspecified: Secondary | ICD-10-CM | POA: Diagnosis not present

## 2021-05-27 DIAGNOSIS — Z951 Presence of aortocoronary bypass graft: Secondary | ICD-10-CM | POA: Insufficient documentation

## 2021-05-27 DIAGNOSIS — I5022 Chronic systolic (congestive) heart failure: Secondary | ICD-10-CM | POA: Diagnosis not present

## 2021-05-27 LAB — ECHOCARDIOGRAM COMPLETE
Area-P 1/2: 2.5 cm2
S' Lateral: 4.1 cm

## 2021-05-28 ENCOUNTER — Telehealth: Payer: Self-pay | Admitting: *Deleted

## 2021-05-28 NOTE — Telephone Encounter (Signed)
-----   Message from [place], [place] sent at 05/15/2021  2:13 PM EST ----- Labs look ok, though his BS was high, even for a non fasting sample, important he controls his blood sugar better and should follow up with his PMD for his DM management.

## 2021-05-28 NOTE — Telephone Encounter (Signed)
Spoke with patient and aware of results and verbalized understanding with recommendations

## 2021-05-29 NOTE — Progress Notes (Signed)
EPIC Encounter for ICM Monitoring  Patient Name: Russell Fuentes is a 71 y.o. male Date: 05/29/2021 Primary Care Physican: [name], MD Primary Cardiologist: [name] Electrophysiologist: Vergie Living Pacing: 98.8%         05/29/2021 Weight: 233 lbs   Time in AT/AF  0.0 hr/day (0.0%)   Battery Longevity: 3 months                                                            Spoke with patient and heart failure questions reviewed.  Pt is doing well.  He says physician may start him on [facility] Thoracic impedance suggesting normal fluid levels.      Prescribed:  Furosemide 20 mg Take 1 tablet (20 mg total) by mouth every other day (3 x week). Spironolactone 25 mg take 0.5 tablet (12.5 mg total) by mouth daily Farxiga 10 mg take 1 tablet daily (samples from Dr Trena Platt office)   Labs: 05/14/2021 Creatinine 1.31, BUN 21, Potassium 4.2, Sodium 138, GFR 58 A complete set of results can be found in Results Review.   Recommendations:  No changes and encouraged to call if experiencing any fluid symptoms.    Follow-up plan: ICM clinic phone appointment on 06/29/2021.  91 day device clinic remote transmission 07/28/2021.       EP/Cardiology Office Visits:  09/09/2021 with Dr [name].     Copy of ICM check sent to Dr. [name].      3 month ICM trend: 05/25/2021.    12-14 Month ICM trend:     Rosalene Billings, RN 05/29/2021 2:38 PM

## 2021-06-01 DIAGNOSIS — E782 Mixed hyperlipidemia: Secondary | ICD-10-CM | POA: Diagnosis not present

## 2021-06-01 DIAGNOSIS — I130 Hypertensive heart and chronic kidney disease with heart failure and stage 1 through stage 4 chronic kidney disease, or unspecified chronic kidney disease: Secondary | ICD-10-CM | POA: Diagnosis not present

## 2021-06-01 DIAGNOSIS — E669 Obesity, unspecified: Secondary | ICD-10-CM | POA: Diagnosis not present

## 2021-06-01 DIAGNOSIS — I5022 Chronic systolic (congestive) heart failure: Secondary | ICD-10-CM | POA: Diagnosis not present

## 2021-06-01 DIAGNOSIS — N419 Inflammatory disease of prostate, unspecified: Secondary | ICD-10-CM | POA: Diagnosis not present

## 2021-06-01 DIAGNOSIS — E1122 Type 2 diabetes mellitus with diabetic chronic kidney disease: Secondary | ICD-10-CM | POA: Diagnosis not present

## 2021-06-01 DIAGNOSIS — N183 Chronic kidney disease, stage 3 unspecified: Secondary | ICD-10-CM | POA: Diagnosis not present

## 2021-06-22 ENCOUNTER — Ambulatory Visit (INDEPENDENT_AMBULATORY_CARE_PROVIDER_SITE_OTHER): Payer: Medicare Other

## 2021-06-22 DIAGNOSIS — I428 Other cardiomyopathies: Secondary | ICD-10-CM

## 2021-06-23 DIAGNOSIS — R972 Elevated prostate specific antigen [PSA]: Secondary | ICD-10-CM | POA: Diagnosis not present

## 2021-06-23 LAB — CUP PACEART REMOTE DEVICE CHECK
Battery Remaining Longevity: 3 mo
Battery Voltage: 2.78 V
Brady Statistic AP VP Percent: 54.14 %
Brady Statistic AP VS Percent: 0.1 %
Brady Statistic AS VP Percent: 45.15 %
Brady Statistic AS VS Percent: 0.6 %
Brady Statistic RA Percent Paced: 52.23 %
Brady Statistic RV Percent Paced: 95.08 %
Date Time Interrogation Session: 20230320073625
HighPow Impedance: 52 Ohm
HighPow Impedance: 63 Ohm
Implantable Lead Implant Date: 20060323
Implantable Lead Implant Date: 20060323
Implantable Lead Implant Date: 20110420
Implantable Lead Location: 753858
Implantable Lead Location: 753859
Implantable Lead Location: 753860
Implantable Lead Model: 4193
Implantable Lead Model: 5076
Implantable Lead Model: 7121
Implantable Pulse Generator Implant Date: 20160613
Lead Channel Impedance Value: 399 Ohm
Lead Channel Impedance Value: 4047 Ohm
Lead Channel Impedance Value: 4047 Ohm
Lead Channel Impedance Value: 418 Ohm
Lead Channel Impedance Value: 475 Ohm
Lead Channel Impedance Value: 551 Ohm
Lead Channel Pacing Threshold Amplitude: 0.875 V
Lead Channel Pacing Threshold Amplitude: 0.875 V
Lead Channel Pacing Threshold Amplitude: 1.875 V
Lead Channel Pacing Threshold Pulse Width: 0.4 ms
Lead Channel Pacing Threshold Pulse Width: 0.4 ms
Lead Channel Pacing Threshold Pulse Width: 0.8 ms
Lead Channel Sensing Intrinsic Amplitude: 2.625 mV
Lead Channel Sensing Intrinsic Amplitude: 2.625 mV
Lead Channel Sensing Intrinsic Amplitude: 24.125 mV
Lead Channel Sensing Intrinsic Amplitude: 24.125 mV
Lead Channel Setting Pacing Amplitude: 1.75 V
Lead Channel Setting Pacing Amplitude: 2.25 V
Lead Channel Setting Pacing Amplitude: 2.5 V
Lead Channel Setting Pacing Pulse Width: 0.4 ms
Lead Channel Setting Pacing Pulse Width: 0.8 ms
Lead Channel Setting Sensing Sensitivity: 0.6 mV

## 2021-06-29 ENCOUNTER — Ambulatory Visit (INDEPENDENT_AMBULATORY_CARE_PROVIDER_SITE_OTHER): Payer: Medicare Other

## 2021-06-29 DIAGNOSIS — Z95810 Presence of automatic (implantable) cardiac defibrillator: Secondary | ICD-10-CM | POA: Diagnosis not present

## 2021-06-29 DIAGNOSIS — I5022 Chronic systolic (congestive) heart failure: Secondary | ICD-10-CM | POA: Diagnosis not present

## 2021-07-02 NOTE — Progress Notes (Signed)
Remote ICD transmission.   

## 2021-07-02 NOTE — Addendum Note (Signed)
Addended by: Cheri Kearns A on: 07/02/2021 03:45 PM ? ? Modules accepted: Level of Service ? ?

## 2021-07-03 NOTE — Progress Notes (Signed)
EPIC Encounter for ICM Monitoring ? ?Patient Name: Russell Fuentes is a 71 y.o. male ?Date: 07/03/2021 ?Primary Care Physican: [name], MD ?Primary Cardiologist: [name] ?Electrophysiologist: [name] ?Bi-V Pacing: 98.9%         ?05/29/2021 Weight: 233 lbs ?  ?Time in AT/AF  0.0 hr/day (0.0%) ?  ?Battery Longevity: 3 months ?  ?                                                         ?Spoke with patient and heart failure questions reviewed.  Pt is doing well.   ?  ?Optivol Thoracic impedance suggesting normal fluid levels.    ?  ?Prescribed:  ?Furosemide 20 mg Take 1 tablet (20 mg total) by mouth every other day (3 x week). ?Spironolactone 25 mg take 0.5 tablet (12.5 mg total) by mouth daily ?Farxiga 10 mg take 1 tablet daily (samples from Dr Trena Platt office) ?  ?Labs: ?05/14/2021 Creatinine 1.31, BUN 21, Potassium 4.2, Sodium 138, GFR 58 ?A complete set of results can be found in Results Review. ?  ?Recommendations:  No changes and encouraged to call if experiencing any fluid symptoms.  ?  ?Follow-up plan: ICM clinic phone appointment on 08/03/2021.  91 day device clinic remote transmission 07/28/2021.     ?  ?EP/Cardiology Office Visits:  09/09/2021 with Dr [name].   ?  ?Copy of ICM check sent to Dr. [name].    ? ?3 month ICM trend: 06/29/2021. ? ? ? ?12-14 Month ICM trend:  ? ? ? ?Rosalene Billings, RN ?07/03/2021 ?1:40 PM ? ?

## 2021-07-16 DIAGNOSIS — J324 Chronic pansinusitis: Secondary | ICD-10-CM | POA: Diagnosis not present

## 2021-07-16 DIAGNOSIS — H6503 Acute serous otitis media, bilateral: Secondary | ICD-10-CM | POA: Diagnosis not present

## 2021-07-28 ENCOUNTER — Ambulatory Visit (INDEPENDENT_AMBULATORY_CARE_PROVIDER_SITE_OTHER): Payer: Medicare Other

## 2021-07-28 DIAGNOSIS — I5022 Chronic systolic (congestive) heart failure: Secondary | ICD-10-CM | POA: Diagnosis not present

## 2021-07-28 DIAGNOSIS — I428 Other cardiomyopathies: Secondary | ICD-10-CM

## 2021-07-29 LAB — CUP PACEART REMOTE DEVICE CHECK
Battery Remaining Longevity: 2 mo
Battery Voltage: 2.76 V
Brady Statistic AP VP Percent: 43.74 %
Brady Statistic AP VS Percent: 0.17 %
Brady Statistic AS VP Percent: 55.33 %
Brady Statistic AS VS Percent: 0.76 %
Brady Statistic RA Percent Paced: 42.95 %
Brady Statistic RV Percent Paced: 96.89 %
Date Time Interrogation Session: 20230426013724
HighPow Impedance: 62 Ohm
HighPow Impedance: 82 Ohm
Implantable Lead Implant Date: 20060323
Implantable Lead Implant Date: 20060323
Implantable Lead Implant Date: 20110420
Implantable Lead Location: 753858
Implantable Lead Location: 753859
Implantable Lead Location: 753860
Implantable Lead Model: 4193
Implantable Lead Model: 5076
Implantable Lead Model: 7121
Implantable Pulse Generator Implant Date: 20160613
Lead Channel Impedance Value: 399 Ohm
Lead Channel Impedance Value: 4047 Ohm
Lead Channel Impedance Value: 4047 Ohm
Lead Channel Impedance Value: 456 Ohm
Lead Channel Impedance Value: 513 Ohm
Lead Channel Impedance Value: 551 Ohm
Lead Channel Pacing Threshold Amplitude: 0.75 V
Lead Channel Pacing Threshold Amplitude: 1 V
Lead Channel Pacing Threshold Amplitude: 1.125 V
Lead Channel Pacing Threshold Pulse Width: 0.4 ms
Lead Channel Pacing Threshold Pulse Width: 0.4 ms
Lead Channel Pacing Threshold Pulse Width: 0.8 ms
Lead Channel Sensing Intrinsic Amplitude: 23.375 mV
Lead Channel Sensing Intrinsic Amplitude: 23.375 mV
Lead Channel Sensing Intrinsic Amplitude: 3.375 mV
Lead Channel Sensing Intrinsic Amplitude: 3.375 mV
Lead Channel Setting Pacing Amplitude: 1.5 V
Lead Channel Setting Pacing Amplitude: 2.25 V
Lead Channel Setting Pacing Amplitude: 2.5 V
Lead Channel Setting Pacing Pulse Width: 0.4 ms
Lead Channel Setting Pacing Pulse Width: 0.8 ms
Lead Channel Setting Sensing Sensitivity: 0.6 mV

## 2021-08-03 ENCOUNTER — Ambulatory Visit (INDEPENDENT_AMBULATORY_CARE_PROVIDER_SITE_OTHER): Payer: Medicare Other

## 2021-08-03 DIAGNOSIS — Z95810 Presence of automatic (implantable) cardiac defibrillator: Secondary | ICD-10-CM | POA: Diagnosis not present

## 2021-08-03 DIAGNOSIS — I5022 Chronic systolic (congestive) heart failure: Secondary | ICD-10-CM

## 2021-08-07 NOTE — Progress Notes (Signed)
EPIC Encounter for ICM Monitoring ? ?Patient Name: Russell Fuentes is a 71 y.o. male ?Date: 08/07/2021 ?Primary Care Physican: [name], MD ?Primary Cardiologist: [name] ?Electrophysiologist: [name] ?Bi-V Pacing: 94.2%         ?08/07/2021 Weight: 233 lbs ?  ?Time in AT/AF  0.0 hr/day (0.0%) ?  ?Battery Longevity: 2 months ?  ?                                                         ?Spoke with patient and heart failure questions reviewed.  Pt asymptomatic for fluid accumulation.  His granddaughter will be in hospice. ?  ?Optivol Thoracic impedance suggesting normal fluid levels.    ?  ?Prescribed:  ?Furosemide 20 mg Take 1 tablet (20 mg total) by mouth every other day (3 x week). ?Spironolactone 25 mg take 0.5 tablet (12.5 mg total) by mouth daily ?Farxiga 10 mg take 1 tablet daily (samples from Dr Trena Platt office) ?  ?Labs: ?05/14/2021 Creatinine 1.31, BUN 21, Potassium 4.2, Sodium 138, GFR 58 ?A complete set of results can be found in Results Review. ?  ?Recommendations:  No changes and encouraged to call if experiencing any fluid symptoms.  ?  ?Follow-up plan: ICM clinic phone appointment on 09/07/2021.  91 day device clinic remote transmission 10/27/2021.     ?  ?EP/Cardiology Office Visits:  09/09/2021 with Dr [name].   ?  ?Copy of ICM check sent to Dr. [name].  ? ?3 month ICM trend: 08/03/2021. ? ? ? ?12-14 Month ICM trend:  ? ? ? ?Rosalene Billings, RN ?08/07/2021 ?4:59 PM ? ?

## 2021-08-10 ENCOUNTER — Telehealth: Payer: Self-pay

## 2021-08-10 NOTE — Telephone Encounter (Signed)
Spoke with patient regarding ICD at [facility] informed him that we had 3 months from 08/09/21 to get device changed out patient hs apt with SK on 09/09/21 informed patient that I would message scheduler to put him on a wait list to get in earlier to see SK. Informed patient that he would hear that alarm tone everyday until he comes into the office, patient voiced understanding, informed patient that if the alert tone was bothering him that we could bring him in and turn it off but it would mean two trips to [place] patient stated he would wait until he sees SK.  ?

## 2021-08-12 DIAGNOSIS — N411 Chronic prostatitis: Secondary | ICD-10-CM | POA: Diagnosis not present

## 2021-08-12 DIAGNOSIS — R972 Elevated prostate specific antigen [PSA]: Secondary | ICD-10-CM | POA: Diagnosis not present

## 2021-08-13 NOTE — Progress Notes (Signed)
Remote ICD transmission.   

## 2021-08-26 DIAGNOSIS — H6123 Impacted cerumen, bilateral: Secondary | ICD-10-CM | POA: Diagnosis not present

## 2021-09-04 DIAGNOSIS — J309 Allergic rhinitis, unspecified: Secondary | ICD-10-CM | POA: Diagnosis not present

## 2021-09-04 DIAGNOSIS — E669 Obesity, unspecified: Secondary | ICD-10-CM | POA: Diagnosis not present

## 2021-09-04 DIAGNOSIS — E1122 Type 2 diabetes mellitus with diabetic chronic kidney disease: Secondary | ICD-10-CM | POA: Diagnosis not present

## 2021-09-04 DIAGNOSIS — N183 Chronic kidney disease, stage 3 unspecified: Secondary | ICD-10-CM | POA: Diagnosis not present

## 2021-09-04 DIAGNOSIS — I130 Hypertensive heart and chronic kidney disease with heart failure and stage 1 through stage 4 chronic kidney disease, or unspecified chronic kidney disease: Secondary | ICD-10-CM | POA: Diagnosis not present

## 2021-09-04 DIAGNOSIS — I5022 Chronic systolic (congestive) heart failure: Secondary | ICD-10-CM | POA: Diagnosis not present

## 2021-09-04 DIAGNOSIS — I429 Cardiomyopathy, unspecified: Secondary | ICD-10-CM | POA: Diagnosis not present

## 2021-09-04 DIAGNOSIS — Z0000 Encounter for general adult medical examination without abnormal findings: Secondary | ICD-10-CM | POA: Diagnosis not present

## 2021-09-04 DIAGNOSIS — E782 Mixed hyperlipidemia: Secondary | ICD-10-CM | POA: Diagnosis not present

## 2021-09-04 IMAGING — CT CT ANGIO HEAD
1 of 4 series · 6 of 30 positions shown · IV contrast (APPLIED)
Comparison: Head CT 09/14/2019

CLINICAL DATA: Vertigo.  Nausea and vomiting.

EXAM:
CT ANGIOGRAPHY HEAD AND NECK
TECHNIQUE: Multidetector CT imaging of the head and neck was performed using
the standard protocol during bolus administration of intravenous
contrast. Multiplanar CT image reconstructions and MIPs were
obtained to evaluate the vascular anatomy. Carotid stenosis
measurements (when applicable) are obtained utilizing NASCET
criteria, using the distal internal carotid diameter as the
denominator.
CONTRAST:  75mL [id number] IOPAMIDOL ([id number]) INJECTION 76%

[Series 7: head/neck angio · axial · 0.55mm/px · z∈[-350,-80]mm · 6 of 190 slices shown]
[im 28/190  brain]
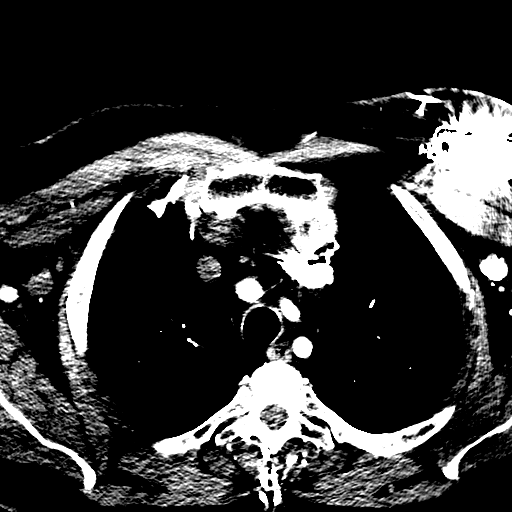
[im 55/190  bone]
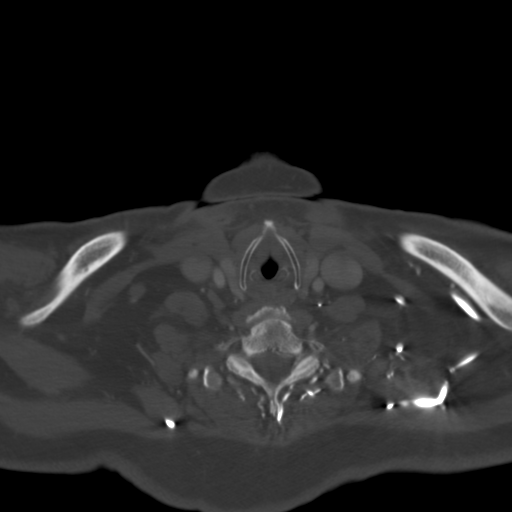
[im 82/190  brain]
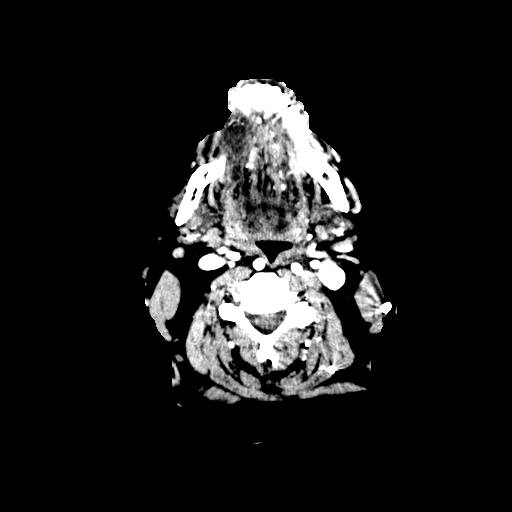
[im 109/190  bone]
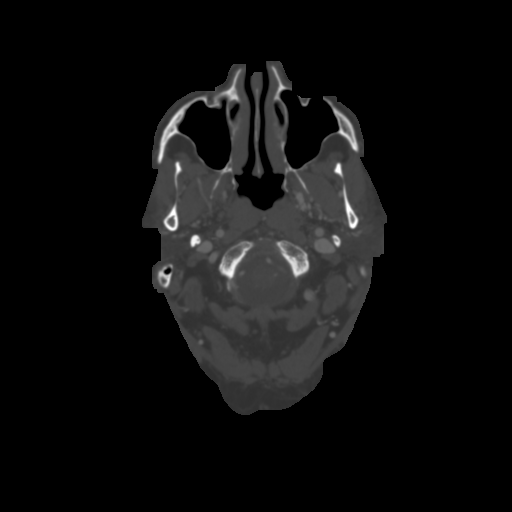
[im 136/190  brain]
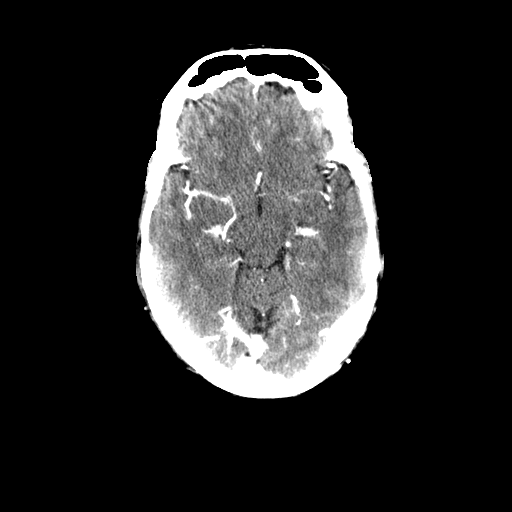
[im 163/190  bone]
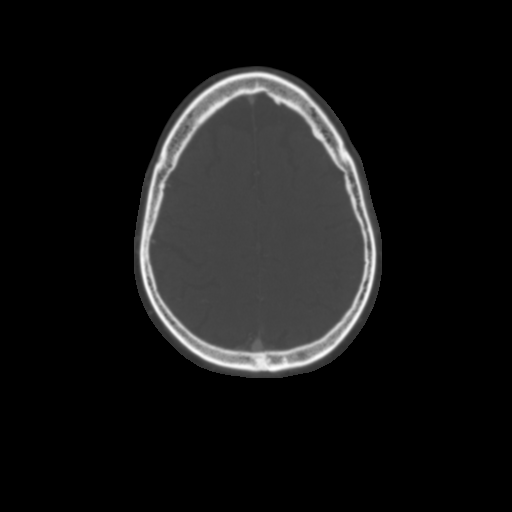

[6 of 30 positions shown; findings below may reference images not displayed]

FINDINGS: CT HEAD FINDINGS

Brain: There is no evidence of an acute infarct, intracranial
hemorrhage, mass, midline shift, or extra-axial fluid collection.
Mild frontal lobe predominant cerebral atrophy is unchanged. The
ventricles are normal in size.

Vascular: No hyperdense vessel.

Skull: No fracture or suspicious osseous lesion. Right parietal
scalp scarring.

Sinuses: Visualized paranasal sinuses and mastoid air cells are
clear.

Orbits: Bilateral cataract extraction.

Review of the MIP images confirms the above findings

CTA NECK FINDINGS

Aortic arch: Standard 3 vessel aortic arch with widely patent arch
vessel origins.

Right carotid system: Patent without evidence of stenosis,
dissection, or significant atherosclerosis. Retropharyngeal course
of the distal common and proximal internal carotid arteries.

Left carotid system: Patent without evidence of stenosis or
dissection. Minimal calcified plaque at the carotid bifurcation.
Partially retropharyngeal course of the proximal ICA.

Vertebral arteries: Patent without evidence of stenosis or
dissection. Moderately dominant left vertebral artery.

Skeleton: Moderate lower cervical spondylosis.

Other neck: No evidence of cervical lymphadenopathy or mass.

Upper chest: Partially visualized pacemaker. Minimal clustered
ground-glass opacity in the right upper lobe, likely infectious or
inflammatory.

Review of the MIP images confirms the above findings

CTA HEAD FINDINGS

Anterior circulation: The internal carotid arteries are patent from
skull base to carotid termini with mild atherosclerotic plaque
bilaterally not resulting in a significant stenosis. ACAs and MCAs
are patent with mild branch vessel irregularity but no evidence of a
proximal branch occlusion or significant proximal stenosis. No
aneurysm is identified.

Posterior circulation: The intracranial vertebral arteries are
widely patent to the basilar. Patent bilateral PICA, left AICA, and
bilateral SCA origins are identified. There are large posterior
communicating arteries and hypoplastic P1 segments bilaterally. Both
PCAs are patent without evidence of a significant proximal stenosis.
No aneurysm is identified.

Venous sinuses: Patent.

Anatomic variants: Predominantly fetal origin of the PCAs.

Review of the MIP images confirms the above findings
IMPRESSION: 1. No evidence of acute intracranial abnormality.
2. Mild intracranial atherosclerosis without large vessel occlusion
or significant proximal stenosis.
3. Widely patent cervical carotid and vertebral arteries.

## 2021-09-09 ENCOUNTER — Encounter: Payer: Self-pay | Admitting: Internal Medicine

## 2021-09-09 ENCOUNTER — Ambulatory Visit (INDEPENDENT_AMBULATORY_CARE_PROVIDER_SITE_OTHER): Payer: Medicare Other | Admitting: Internal Medicine

## 2021-09-09 VITALS — BP 108/64 | Ht 70.0 in | Wt 229.4 lb

## 2021-09-09 DIAGNOSIS — Z95810 Presence of automatic (implantable) cardiac defibrillator: Secondary | ICD-10-CM

## 2021-09-09 DIAGNOSIS — I428 Other cardiomyopathies: Secondary | ICD-10-CM | POA: Diagnosis not present

## 2021-09-09 DIAGNOSIS — I5022 Chronic systolic (congestive) heart failure: Secondary | ICD-10-CM

## 2021-09-09 LAB — BASIC METABOLIC PANEL
BUN/Creatinine Ratio: 13 (ref 10–24)
BUN: 17 mg/dL (ref 8–27)
CO2: 29 mmol/L (ref 20–29)
Calcium: 9.4 mg/dL (ref 8.6–10.2)
Chloride: 104 mmol/L (ref 96–106)
Creatinine, Ser: 1.27 mg/dL (ref 0.76–1.27)
Glucose: 148 mg/dL — ABNORMAL HIGH (ref 70–99)
Potassium: 4.5 mmol/L (ref 3.5–5.2)
Sodium: 138 mmol/L (ref 134–144)
eGFR: 60 mL/min/{1.73_m2} (ref >59)

## 2021-09-09 LAB — CBC
Hematocrit: 44.7 % (ref 37.5–51.0)
Hemoglobin: 15.2 g/dL (ref 13.0–17.7)
MCH: 30.3 pg (ref 26.6–33.0)
MCHC: 34 g/dL (ref 31.5–35.7)
MCV: 89 fL (ref 79–97)
Platelets: 168 10*3/uL (ref 150–450)
RBC: 5.01 x10E6/uL (ref 4.14–5.80)
RDW: 14.5 % (ref 11.6–15.4)
WBC: 6.7 10*3/uL (ref 3.4–10.8)

## 2021-09-09 MED ORDER — CARVEDILOL 12.5 MG PO TABS: 12.5000 mg | ORAL_TABLET | Freq: Two times a day (BID) | ORAL | 3 refills | Status: DC

## 2021-09-09 NOTE — Patient Instructions (Signed)
Medication Instructions:  Your physician has recommended you make the following change in your medication:   ** Stop Carvedilol '25mg'$  ** Begin Carvedilol 12.'5mg'$  - 1 tablet by mouth twice dail8  *If you need a refill on your cardiac medications before your next appointment, please call your pharmacy*   Lab Work: CBC and BMET today If you have labs (blood work) drawn today and your tests are completely normal, you will receive your results only by: [place] (if you have MyChart) OR A paper copy in the mail If you have any lab test that is abnormal or we need to change your treatment, we will call you to review the results.   Testing/Procedures: None ordered.    Follow-Up: At [facility], you and your health needs are our priority.  As part of our continuing mission to provide you with exceptional heart care, we have created designated Provider Care Teams.  These Care Teams include your primary Cardiologist (physician) and Advanced Practice Providers (APPs -  Physician Assistants and Nurse Practitioners) who all work together to provide you with the care you need, when you need it.  We recommend signing up for the patient portal called "MyChart".  Sign up information is provided on this After Visit Summary.  MyChart is used to connect with patients for Virtual Visits (Telemedicine).  Patients are able to view lab/test results, encounter notes, upcoming appointments, etc.  Non-urgent messages can be sent to your provider as well.   To learn more about what you can do with MyChart, go to [email or web address].    Your next appointment:   To be scheduled   Important Information About Sugar

## 2021-09-09 NOTE — Progress Notes (Signed)
Patient ID: Russell Fuentes, male   DOB: April 28, 1950, 71 y.o.   MRN: [id number]      Patient Care Team: [name], MD as PCP - General (Family Medicine) [name], MD as PCP - Electrophysiology (Cardiology)   HPI  Russell Fuentes is a 71 y.o. male Seen in followup for an ICD  CRT Medtronic implanted for nonischemic cardiomyopathy;  he underwent generator replacement 6/16   The patient denies chest pain, shortness of breath, nocturnal dyspnea, orthopnea .  There have been no palpitations, lightheadedness or syncope.  Complains of some edema  GD dying of a brain tumor.       Date Cr K Hgb  6/19 1.17 4.3 15.5  2/20 1.33 4.3    7/21 1.25 4.2 13.9  2/23 1.31 4.2    DATE TEST EF   11/14 Echo   55-65 %   9/15 LHC  No obstructive CAD  5/18 Echo   35 %   5/18 Echo  62  AV/VV optimization  8/19 Echo  35-40%   7/21 Echo  40-45%   2/23 Echo  35-40% LA mod    Past Medical History:  Diagnosis Date   CHF (congestive heart failure) (HCC)    Chronic combined systolic and diastolic heart failure (HCC)     Diabetes mellitus with neuropathy ([place])     Hypertension    Nonischemic cardiomyopathy ([place])     a. s/p MDT CRTD   Orthostatic lightheadedness      Past Surgical History:  Procedure Laterality Date   CARDIAC CATHETERIZATION  12/31/1998   Bi-plane cine left ventriculography revealed low-normal left ventricular function with an ejection fraction in the 45% to 50% range.   CORONARY ARTERY BYPASS GRAFT     DOPPLER ECHOCARDIOGRAPHY  04/28/2011   LVEF 41% by simpson's borderline concentric LVH, global hypokinesis with regional variation. Stage 1 diastolic dysfunction, normal LV filling pressure. Normal LA size. Right heart pacing wires noted. Trace MR, TR. Normal RVSP   EP IMPLANTABLE DEVICE N/A 09/16/2014   Procedure: ICD Generator Changeout;  Surgeon: [name], MD;  Location: [facility];  Service: Cardiovascular;  Laterality: N/A;   LEFT HEART  CATHETERIZATION WITH CORONARY ANGIOGRAM N/A 12/11/2013   Procedure: LEFT HEART CATHETERIZATION WITH CORONARY ANGIOGRAM;  Surgeon: [name], MD;  Location: [facility];  Service: Cardiovascular;  Laterality: N/A;   NM MYOCAR PERF EJECTION FRACTION  04/03/2009, 11/18/2005   The post stress myocardial perfusion images show a normal pattern of perfusion in all regions. The post-stress ejection fraction is 50%. No significant wall motion abnormalities noted. This is a low risk scan.    Current Outpatient Medications  Medication Sig Dispense Refill   aspirin EC 81 MG tablet Take 81 mg by mouth daily. Swallow whole.     carvedilol (COREG) 25 MG tablet TAKE 1 TABLET BY MOUTH TWICE DAILY WITH A MEAL 180 tablet 3   dapagliflozin propanediol (FARXIGA) 10 MG TABS tablet Take 1 tablet by mouth daily.     furosemide (LASIX) 20 MG tablet Every other day (3x per week) 63 tablet 3   glimepiride (AMARYL) 4 MG tablet Take 4 mg by mouth daily with breakfast.     LOVASTATIN PO Take 20 mg by mouth daily.     spironolactone (ALDACTONE) 25 MG tablet TAKE 1/2 TABLET(12.5 MG) BY MOUTH DAILY 45 tablet 2   valsartan (DIOVAN) 80 MG tablet TAKE 1 TABLET(80 MG) BY MOUTH DAILY 90 tablet 3  No current facility-administered medications for this visit.    Allergies  Allergen Reactions   Entresto [Sacubitril-Valsartan]     Fatigue, joint pain. Ok on valsartan alone   Metformin Hcl Other (See Comments)   Codeine Rash    Review of Systems negative except from HPI and PMH  Physical Exam: BP 108/64   Ht '5\' 10"'$  (1.778 m)   Wt 229 lb 6.4 oz (104.1 kg)   SpO2 99%   BMI 32.92 kg/m  Well developed and well nourished in no acute distress HENT normal Neck supple with JVP-flat Clear Device pocket well healed; without hematoma or erythema.  There is no tethering  Regular rate and rhythm, no murmur Abd-soft with active BS No Clubbing cyanosis 1+ edema Skin-warm and dry A & Oriented  Grossly normal sensory and  motor function  ECG sinus at 72 with P synchronous pacing negative QRS lead I and upright QRS lead V1  EKG sinus with P synchronous pacing.  Initial ECG showed QRS morphology that was negative in lead V I and positive in lead I.  Reviewing tracings back to 9/21 they were upright in lead V1 and negative in lead I  We will continue up again it was biphasic in lead V1 and negative in lead I.  We increased the LV offset from 0---20 ms with improvement in the operating QRS morphology lead V1   Assessment and  Plan  Nonischemic cardiomyopathy-   Congestive heart failure-acute/chronic-diastolic  Diabetes  Implantable defibrillator-Medtronic- CRT device at [facility]  High Risk Medication Surveillance Aldactone  Vertigo  Vaccination status negative   Heart failure status is stable.  Continue him on furosemide 20 mg 3 days a week and increase as needed for edema.  Metabolic profile stable.  His device is reached ERI.  We will undertake generator replacement.  Some orthostatic lightheadedness.  We will decrease his carvedilol from 25--12.5 twice daily.  Also has vertigo.[name]

## 2021-09-10 NOTE — Progress Notes (Signed)
No ICM remote transmission received for 09/07/2021 and next ICM transmission scheduled for 11/02/2021 following device battery change on 09/28/2021.

## 2021-09-24 ENCOUNTER — Ambulatory Visit (INDEPENDENT_AMBULATORY_CARE_PROVIDER_SITE_OTHER): Payer: Medicare Other

## 2021-09-24 DIAGNOSIS — I428 Other cardiomyopathies: Secondary | ICD-10-CM

## 2021-09-24 DIAGNOSIS — Z95810 Presence of automatic (implantable) cardiac defibrillator: Secondary | ICD-10-CM

## 2021-09-24 DIAGNOSIS — I5022 Chronic systolic (congestive) heart failure: Secondary | ICD-10-CM | POA: Diagnosis not present

## 2021-09-24 NOTE — Progress Notes (Signed)
EPIC Encounter for ICM Monitoring  Patient Name: Russell Fuentes is a 71 y.o. male Date: 09/24/2021 Primary Care Physican: [name], MD Primary Cardiologist: [name] Electrophysiologist: Vergie Living Pacing: 97%         09/24/2021 Weight: 220 lbs   Time in AT/AF  <0.1 hr/day (<0.1%)                                                            Spoke with patient and heart failure questions reviewed.  Pt was told by Dr [name] at [facility] he has some fluid accumulation.  37 year old granddaughter passed away a week ago.   ICD replacement battery scheduled for 6/26.   Optivol Thoracic impedance suggesting possible fluid accumulation starting 6/2 and trending back toward baseline normal.      Prescribed:  Furosemide 20 mg Take 1 tablet (20 mg total) by mouth every other day (4 x week). Spironolactone 25 mg take 0.5 tablet (12.5 mg total) by mouth daily Farxiga 10 mg take 1 tablet daily    Labs: 09/09/2021 Creatinine 1.27, BUN 17, Potasium 4.5, Sodium 138, GFR 60 05/14/2021 Creatinine 1.31, BUN 21, Potassium 4.2, Sodium 138, GFR 58 A complete set of results can be found in Results Review.   Recommendations:  He is taking Furosemide daily since OV with Dr [name] and will continue until 6/25.    Follow-up plan: ICM clinic phone appointment on 11/09/2021 (post battery change).  91 day device clinic remote transmission 10/27/2021.       EP/Cardiology Office Visits:  01/01/2022 with Dr [name].     Copy of ICM check sent to Dr. [name].   3 month ICM trend: 09/24/2021.    12-14 Month ICM trend:     Rosalene Billings, RN 09/24/2021 9:01 AM

## 2021-09-28 ENCOUNTER — Ambulatory Visit (HOSPITAL_COMMUNITY)
Admission: RE | Disposition: A | Discharge status: Home / Self Care | Payer: Self-pay | Source: Home / Self Care | Attending: Internal Medicine

## 2021-09-28 ENCOUNTER — Other Ambulatory Visit: Payer: Self-pay

## 2021-09-28 ENCOUNTER — Ambulatory Visit (HOSPITAL_COMMUNITY)
Admission: RE | Admit: 2021-09-28 | Discharge: 2021-09-28 | Disposition: A | Discharge status: Home / Self Care | Payer: Medicare Other | Attending: Internal Medicine | Admitting: Internal Medicine

## 2021-09-28 DIAGNOSIS — I428 Other cardiomyopathies: Secondary | ICD-10-CM | POA: Diagnosis not present

## 2021-09-28 DIAGNOSIS — R42 Dizziness and giddiness: Secondary | ICD-10-CM | POA: Insufficient documentation

## 2021-09-28 DIAGNOSIS — Z7984 Long term (current) use of oral hypoglycemic drugs: Secondary | ICD-10-CM | POA: Insufficient documentation

## 2021-09-28 DIAGNOSIS — Z79899 Other long term (current) drug therapy: Secondary | ICD-10-CM | POA: Insufficient documentation

## 2021-09-28 DIAGNOSIS — E1140 Type 2 diabetes mellitus with diabetic neuropathy, unspecified: Secondary | ICD-10-CM | POA: Diagnosis not present

## 2021-09-28 DIAGNOSIS — Z95810 Presence of automatic (implantable) cardiac defibrillator: Secondary | ICD-10-CM

## 2021-09-28 DIAGNOSIS — I110 Hypertensive heart disease with heart failure: Secondary | ICD-10-CM | POA: Insufficient documentation

## 2021-09-28 DIAGNOSIS — Z4502 Encounter for adjustment and management of automatic implantable cardiac defibrillator: Secondary | ICD-10-CM | POA: Insufficient documentation

## 2021-09-28 DIAGNOSIS — I5032 Chronic diastolic (congestive) heart failure: Secondary | ICD-10-CM | POA: Diagnosis not present

## 2021-09-28 HISTORY — PX: BIV ICD GENERATOR CHANGEOUT: EP1194

## 2021-09-28 LAB — CUP PACEART REMOTE DEVICE CHECK
Battery Remaining Longevity: 1 mo
Battery Voltage: 2.71 V
Brady Statistic AP VP Percent: 40.95 %
Brady Statistic AP VS Percent: 0.12 %
Brady Statistic AS VP Percent: 57.8 %
Brady Statistic AS VS Percent: 1.13 %
Brady Statistic RA Percent Paced: 40.34 %
Brady Statistic RV Percent Paced: 97.04 %
Date Time Interrogation Session: 20230622001806
HighPow Impedance: 55 Ohm
HighPow Impedance: 73 Ohm
Implantable Lead Implant Date: 20060323
Implantable Lead Implant Date: 20060323
Implantable Lead Implant Date: 20110420
Implantable Lead Location: 753858
Implantable Lead Location: 753859
Implantable Lead Location: 753860
Implantable Lead Model: 4193
Implantable Lead Model: 5076
Implantable Lead Model: 7121
Implantable Pulse Generator Implant Date: 20160613
Lead Channel Impedance Value: 361 Ohm
Lead Channel Impedance Value: 399 Ohm
Lead Channel Impedance Value: 4047 Ohm
Lead Channel Impedance Value: 4047 Ohm
Lead Channel Impedance Value: 475 Ohm
Lead Channel Impedance Value: 551 Ohm
Lead Channel Pacing Threshold Amplitude: 0.75 V
Lead Channel Pacing Threshold Amplitude: 1 V
Lead Channel Pacing Threshold Amplitude: 2.125 V
Lead Channel Pacing Threshold Pulse Width: 0.4 ms
Lead Channel Pacing Threshold Pulse Width: 0.4 ms
Lead Channel Pacing Threshold Pulse Width: 0.8 ms
Lead Channel Sensing Intrinsic Amplitude: 2.875 mV
Lead Channel Sensing Intrinsic Amplitude: 2.875 mV
Lead Channel Sensing Intrinsic Amplitude: 22.375 mV
Lead Channel Sensing Intrinsic Amplitude: 22.375 mV
Lead Channel Setting Pacing Amplitude: 1.5 V
Lead Channel Setting Pacing Amplitude: 2 V
Lead Channel Setting Pacing Amplitude: 2.5 V
Lead Channel Setting Pacing Pulse Width: 0.4 ms
Lead Channel Setting Pacing Pulse Width: 0.8 ms
Lead Channel Setting Sensing Sensitivity: 0.6 mV

## 2021-09-28 LAB — GLUCOSE, CAPILLARY: Glucose-Capillary: 144 mg/dL — ABNORMAL HIGH (ref 70–99)

## 2021-09-28 SURGERY — BIV ICD GENERATOR CHANGEOUT

## 2021-09-28 MED ORDER — SODIUM CHLORIDE 0.9 % IV SOLN: INTRAVENOUS | Status: DC

## 2021-09-28 MED ORDER — FENTANYL CITRATE (PF) 100 MCG/2ML IJ SOLN
INTRAMUSCULAR | Status: AC
  Filled 2021-09-28: qty 2

## 2021-09-28 MED ORDER — ONDANSETRON HCL 4 MG/2ML IJ SOLN: 4.0000 mg | Freq: Four times a day (QID) | INTRAMUSCULAR | Status: DC | PRN

## 2021-09-28 MED ORDER — CEFAZOLIN SODIUM-DEXTROSE 2-4 GM/100ML-% IV SOLN
INTRAVENOUS | Status: AC
  Filled 2021-09-28: qty 100

## 2021-09-28 MED ORDER — SODIUM CHLORIDE 0.9 % IV SOLN
80.0000 mg | INTRAVENOUS | Status: AC
  Administered 2021-09-28: 80 mg

## 2021-09-28 MED ORDER — SODIUM CHLORIDE 0.9 % IV SOLN
INTRAVENOUS | Status: AC
  Filled 2021-09-28: qty 2

## 2021-09-28 MED ORDER — MIDAZOLAM HCL 5 MG/5ML IJ SOLN
INTRAMUSCULAR | Status: AC
  Filled 2021-09-28: qty 5

## 2021-09-28 MED ORDER — LIDOCAINE HCL 1 % IJ SOLN
INTRAMUSCULAR | Status: AC
  Filled 2021-09-28: qty 60

## 2021-09-28 MED ORDER — ACETAMINOPHEN 325 MG PO TABS: 325.0000 mg | ORAL_TABLET | ORAL | Status: DC | PRN

## 2021-09-28 MED ORDER — CHLORHEXIDINE GLUCONATE 4 % EX LIQD: 4.0000 | Freq: Once | CUTANEOUS | Status: DC

## 2021-09-28 MED ORDER — LIDOCAINE HCL (PF) 1 % IJ SOLN
INTRAMUSCULAR | Status: DC | PRN
  Administered 2021-09-28: 60 mL

## 2021-09-28 MED ORDER — CEFAZOLIN SODIUM-DEXTROSE 2-4 GM/100ML-% IV SOLN
2.0000 g | INTRAVENOUS | Status: AC
  Administered 2021-09-28: 2 g via INTRAVENOUS

## 2021-09-28 SURGICAL SUPPLY — 6 items
CABLE SURGICAL S-101-97-12 (CABLE) ×1 IMPLANT
ICD CLARIA MRI DTMA1D1 (ICD Generator) ×1 IMPLANT
PAD DEFIB LIFELINK (PAD) ×1 IMPLANT
POUCH AIGIS-R ANTIBACT ICD (Mesh General) ×2 IMPLANT
POUCH AIGIS-R ANTIBACT ICD LRG (Mesh General) IMPLANT
TRAY PACEMAKER INSERTION (PACKS) ×1 IMPLANT

## 2021-09-28 NOTE — Interval H&P Note (Signed)
History and Physical Interval Note:  09/28/2021 11:53 AM  Russell Fuentes  has presented today for surgery, with the diagnosis of cardiomyopathy.  The various methods of treatment have been discussed with the patient and family. After consideration of risks, benefits and other options for treatment, the patient has consented to  Procedure(s): BIV ICD GENERATOR CHANGEOUT (N/A) as a surgical intervention.  The patient's history has been reviewed, patient examined, no change in status, stable for surgery.  I have reviewed the patient's chart and labs.  Questions were answered to the patient's satisfaction.     Sherryl Manges

## 2021-09-29 ENCOUNTER — Encounter (HOSPITAL_COMMUNITY): Payer: Self-pay | Admitting: Internal Medicine

## 2021-09-29 MED FILL — Midazolam HCl Inj 5 MG/5ML (Base Equivalent): INTRAMUSCULAR | Qty: 5 | Status: AC

## 2021-09-29 MED FILL — Fentanyl Citrate Preservative Free (PF) Inj 100 MCG/2ML: INTRAMUSCULAR | Qty: 2 | Status: AC

## 2021-09-30 ENCOUNTER — Encounter (HOSPITAL_COMMUNITY): Payer: Self-pay | Admitting: Internal Medicine

## 2021-10-01 ENCOUNTER — Telehealth: Payer: Self-pay | Admitting: Internal Medicine

## 2021-10-01 NOTE — Telephone Encounter (Signed)
Pt states that he had procedure on Monday and would like to know whether or not he is suppose to keep adhesive tape on until appt on 7/6 or is he able to go ahead and take it off. Please advise

## 2021-10-01 NOTE — Telephone Encounter (Signed)
Successful telephone encounter to patient to advise removing clear outer dressing from Bi-V gen change site 09/28/21. Patient had not removed as instructed and skin irritation under clear dressing had developed. Patient and wife removed outer dressing while on phone with this RN. Steri-strips intact underneath. Patient advised he could use a minimal amt of OTC hydrocortisone cream if irritation is away from steri-striped area however he is to NOT use any ointments and should leave steri-strips clean, dry, and intact. Patient and wife verbalize understanding. Wound check appointment confirmed.

## 2021-10-02 NOTE — Progress Notes (Signed)
Remote ICD transmission.   

## 2021-10-02 NOTE — Addendum Note (Signed)
Addended by: Cheri Kearns A on: 10/02/2021 10:17 AM   Modules accepted: Level of Service

## 2021-10-08 ENCOUNTER — Ambulatory Visit (INDEPENDENT_AMBULATORY_CARE_PROVIDER_SITE_OTHER): Payer: Medicare Other

## 2021-10-08 DIAGNOSIS — I428 Other cardiomyopathies: Secondary | ICD-10-CM

## 2021-10-08 NOTE — Patient Instructions (Signed)
   After Your ICD (Implantable Cardiac Defibrillator)    Monitor your defibrillator site for redness, swelling, and drainage. Call the device clinic at [phone] if you experience these symptoms or fever/chills.  Your incision was closed with Steri-strips or staples:  You may shower and wash your incision with soap and water. Avoid lotions, ointments, or perfumes over your incision until it is well-healed.  You may use a hot tub or a pool after your wound check appointment if the incision is completely closed.  Your ICD is not MRI compatible.  Your ICD is designed to protect you from life threatening heart rhythms. Because of this, you may receive a shock.   1 shock with no symptoms:  Call the office during business hours. 1 shock with symptoms (chest pain, chest pressure, dizziness, lightheadedness, shortness of breath, overall feeling unwell):  Call 911. If you experience 2 or more shocks in 24 hours:  Call 911. If you receive a shock, you should not drive.  [place] DMV - no driving for 6 months if you receive appropriate therapy from your ICD.   ICD Alerts:  Some alerts are vibratory and others beep. These are NOT emergencies. Please call our office to let us know. If this occurs at night or on weekends, it can wait until the next business day. Send a remote transmission.  If your device is capable of reading fluid status (for heart failure), you will be offered monthly monitoring to review this with you.   Remote monitoring is used to monitor your ICD from home. This monitoring is scheduled every 91 days by our office. It allows [place] to keep an eye on the functioning of your device to ensure it is working properly. You will routinely see your Electrophysiologist annually (more often if necessary).

## 2021-10-11 LAB — CUP PACEART INCLINIC DEVICE CHECK
Battery Remaining Longevity: 74 mo
Battery Voltage: 3.08 V
Brady Statistic AP VP Percent: 36.26 %
Brady Statistic AP VS Percent: 0.6 %
Brady Statistic AS VP Percent: 60.3 %
Brady Statistic AS VS Percent: 2.84 %
Brady Statistic RA Percent Paced: 32.74 %
Brady Statistic RV Percent Paced: 3.14 %
Date Time Interrogation Session: 20230706094800
HighPow Impedance: 62 Ohm
HighPow Impedance: 88 Ohm
Implantable Lead Implant Date: 20060323
Implantable Lead Implant Date: 20060323
Implantable Lead Implant Date: 20110420
Implantable Lead Location: 753858
Implantable Lead Location: 753859
Implantable Lead Location: 753860
Implantable Lead Model: 4193
Implantable Lead Model: 5076
Implantable Lead Model: 7121
Implantable Pulse Generator Implant Date: 20230626
Lead Channel Impedance Value: 342 Ohm
Lead Channel Impedance Value: 4047 Ohm
Lead Channel Impedance Value: 4047 Ohm
Lead Channel Impedance Value: 418 Ohm
Lead Channel Impedance Value: 456 Ohm
Lead Channel Impedance Value: 551 Ohm
Lead Channel Pacing Threshold Amplitude: 0.5 V
Lead Channel Pacing Threshold Amplitude: 1 V
Lead Channel Pacing Threshold Amplitude: 1.5 V
Lead Channel Pacing Threshold Pulse Width: 0.4 ms
Lead Channel Pacing Threshold Pulse Width: 0.4 ms
Lead Channel Pacing Threshold Pulse Width: 0.8 ms
Lead Channel Sensing Intrinsic Amplitude: 24.375 mV
Lead Channel Sensing Intrinsic Amplitude: 4.375 mV
Lead Channel Setting Pacing Amplitude: 1.5 V
Lead Channel Setting Pacing Amplitude: 2.75 V
Lead Channel Setting Pacing Amplitude: 2.75 V
Lead Channel Setting Pacing Pulse Width: 0.4 ms
Lead Channel Setting Pacing Pulse Width: 0.8 ms
Lead Channel Setting Sensing Sensitivity: 0.6 mV

## 2021-10-11 NOTE — Progress Notes (Signed)
Wound check appointment s/p CRT-D gen change 9/26. Steri-strips removed. Wound without redness or edema. Incision edges approximated, wound well healed. Normal device function. Thresholds, sensing, and impedances consistent with implant measurements. Device programmed for chronic lead settings. Histogram distribution appropriate for patient and level of activity. BiV pacing 95%, effective 92%. No mode switches or ventricular arrhythmias noted. Patient educated about wound care, arm mobility, lifting restrictions, shock plan. Patient enrolled in remote monitoring with next transmission scheduled 12/29/21. 91 day follow up with Dr. [name] 01/01/22.

## 2021-10-13 DIAGNOSIS — L821 Other seborrheic keratosis: Secondary | ICD-10-CM | POA: Diagnosis not present

## 2021-10-13 DIAGNOSIS — Z85828 Personal history of other malignant neoplasm of skin: Secondary | ICD-10-CM | POA: Diagnosis not present

## 2021-10-13 DIAGNOSIS — L812 Freckles: Secondary | ICD-10-CM | POA: Diagnosis not present

## 2021-10-13 DIAGNOSIS — D1801 Hemangioma of skin and subcutaneous tissue: Secondary | ICD-10-CM | POA: Diagnosis not present

## 2021-10-13 DIAGNOSIS — L570 Actinic keratosis: Secondary | ICD-10-CM | POA: Diagnosis not present

## 2021-10-13 DIAGNOSIS — L738 Other specified follicular disorders: Secondary | ICD-10-CM | POA: Diagnosis not present

## 2021-10-21 ENCOUNTER — Other Ambulatory Visit: Payer: Self-pay

## 2021-10-21 MED ORDER — VALSARTAN 80 MG PO TABS: ORAL_TABLET | ORAL | 3 refills | Status: DC

## 2021-10-21 MED ORDER — SPIRONOLACTONE 25 MG PO TABS: ORAL_TABLET | ORAL | 3 refills | Status: DC

## 2021-10-21 NOTE — Addendum Note (Signed)
Addended by: Carter Kitten D on: 10/21/2021 01:12 PM   Modules accepted: Orders

## 2021-10-26 DIAGNOSIS — H6122 Impacted cerumen, left ear: Secondary | ICD-10-CM | POA: Diagnosis not present

## 2021-10-26 DIAGNOSIS — R0981 Nasal congestion: Secondary | ICD-10-CM | POA: Diagnosis not present

## 2021-10-26 DIAGNOSIS — J324 Chronic pansinusitis: Secondary | ICD-10-CM | POA: Diagnosis not present

## 2021-11-09 ENCOUNTER — Ambulatory Visit (INDEPENDENT_AMBULATORY_CARE_PROVIDER_SITE_OTHER): Payer: Medicare Other

## 2021-11-09 DIAGNOSIS — Z95810 Presence of automatic (implantable) cardiac defibrillator: Secondary | ICD-10-CM

## 2021-11-09 DIAGNOSIS — I5022 Chronic systolic (congestive) heart failure: Secondary | ICD-10-CM | POA: Diagnosis not present

## 2021-11-12 ENCOUNTER — Telehealth: Payer: Self-pay

## 2021-11-12 NOTE — Telephone Encounter (Signed)
I spoke with patient about missed transmission. I helped him send a manual transmission. He would like for Russell Fuentes to give him a call. I told him I will send her a phone note.

## 2021-11-13 ENCOUNTER — Telehealth: Payer: Self-pay

## 2021-11-13 NOTE — Telephone Encounter (Signed)
Remote ICM transmission received.  Attempted call to patient regarding ICM remote transmission and left message per DPR to return call.   

## 2021-11-13 NOTE — Telephone Encounter (Signed)
Attempted return call to patient and left message.  Received remote transmission and reviewed (see ICM note)

## 2021-11-13 NOTE — Progress Notes (Signed)
Spoke with patient and heart failure questions reviewed.  Pt asymptomatic for fluid accumulation.  Reports feeling well at this time and voices no complaints. No changes and encouraged to call if experiencing any fluid symptoms.

## 2021-11-13 NOTE — Progress Notes (Signed)
EPIC Encounter for ICM Monitoring  Patient Name: Russell Fuentes is a 71 y.o. male Date: 11/13/2021 Primary Care Physican: [name], MD Primary Cardiologist: [name] Electrophysiologist: Vergie Living Pacing: 95.4%         09/24/2021 Weight: 220 lbs   Time in AT/AF  <0.1 hr/day (<0.1%)                                                            Attempted call to patient and unable to reach.  Left message to return call. Transmission reviewed.    Optivol Thoracic impedance suggesting normal fluid levels.      Prescribed:  Furosemide 20 mg Take 1 tablet (20 mg total) by mouth every other day (3 x week). Spironolactone 25 mg take 0.5 tablet (12.5 mg total) by mouth daily Farxiga 10 mg take 1 tablet daily    Labs: 09/09/2021 Creatinine 1.27, BUN 17, Potasium 4.5, Sodium 138, GFR 60 05/14/2021 Creatinine 1.31, BUN 21, Potassium 4.2, Sodium 138, GFR 58 A complete set of results can be found in Results Review.   Recommendations:  Unable to reach.     Follow-up plan: ICM clinic phone appointment on 12/14/2021.  91 day device clinic remote transmission 12/29/2021.       EP/Cardiology Office Visits:  01/01/2022 with Dr [name].     Copy of ICM check sent to Dr. [name].   3 month ICM trend: 11/12/2021.    12-14 Month ICM trend:     Rosalene Billings, RN 11/13/2021 9:21 AM

## 2021-12-10 DIAGNOSIS — J029 Acute pharyngitis, unspecified: Secondary | ICD-10-CM | POA: Diagnosis not present

## 2021-12-10 DIAGNOSIS — R051 Acute cough: Secondary | ICD-10-CM | POA: Diagnosis not present

## 2021-12-10 DIAGNOSIS — R509 Fever, unspecified: Secondary | ICD-10-CM | POA: Diagnosis not present

## 2021-12-10 DIAGNOSIS — R0981 Nasal congestion: Secondary | ICD-10-CM | POA: Diagnosis not present

## 2021-12-16 NOTE — Progress Notes (Signed)
No ICM remote transmission received for 12/14/2021 and next ICM transmission scheduled for 01/04/2022.

## 2022-01-01 ENCOUNTER — Encounter: Payer: Self-pay | Admitting: Internal Medicine

## 2022-01-01 ENCOUNTER — Ambulatory Visit: Payer: Medicare Other | Attending: Internal Medicine | Admitting: Internal Medicine

## 2022-01-01 VITALS — BP 104/62 | HR 72 | Ht 70.0 in | Wt 227.0 lb

## 2022-01-01 DIAGNOSIS — I428 Other cardiomyopathies: Secondary | ICD-10-CM | POA: Diagnosis not present

## 2022-01-01 DIAGNOSIS — I5042 Chronic combined systolic (congestive) and diastolic (congestive) heart failure: Secondary | ICD-10-CM | POA: Diagnosis not present

## 2022-01-01 DIAGNOSIS — Z95810 Presence of automatic (implantable) cardiac defibrillator: Secondary | ICD-10-CM | POA: Diagnosis not present

## 2022-01-01 NOTE — Patient Instructions (Signed)
Medication Instructions:  Your physician recommends that you continue on your current medications as directed. Please refer to the Current Medication list given to you today.   *If you need a refill on your cardiac medications before your next appointment, please call your pharmacy*   Lab Work: None ordered.  If you have labs (blood work) drawn today and your tests are completely normal, you will receive your results only by: MyChart Message (if you have MyChart) OR A paper copy in the mail If you have any lab test that is abnormal or we need to change your treatment, we will call you to review the results.   Testing/Procedures: None ordered.    Follow-Up: At  [facility], you and your health needs are our priority.  As part of our continuing mission to provide you with exceptional heart care, we have created designated Provider Care Teams.  These Care Teams include your primary Cardiologist (physician) and Advanced Practice Providers (APPs -  Physician Assistants and Nurse Practitioners) who all work together to provide you with the care you need, when you need it.  We recommend signing up for the patient portal called "MyChart".  Sign up information is provided on this After Visit Summary.  MyChart is used to connect with patients for Virtual Visits (Telemedicine).  Patients are able to view lab/test results, encounter notes, upcoming appointments, etc.  Non-urgent messages can be sent to your provider as well.   To learn more about what you can do with MyChart, go to [email or web address].    Your next appointment:   9 months with Dr Klein  Important Information About Sugar       

## 2022-01-01 NOTE — Progress Notes (Signed)
Patient ID: Russell Fuentes, male   DOB: [birth year], 71 y.o.   MRN: [id number]      Patient Care Team: [name], MD as PCP - General (Family Medicine) [name], MD as PCP - Electrophysiology (Cardiology)   HPI  Russell Fuentes is a 71 y.o. male Seen in followup for an ICD  CRT Medtronic implanted for nonischemic cardiomyopathy;  generator replacement 2016, 2023   The patient denies chest pain, nocturnal dyspnea, orthopnea or peripheral edema.  There have been no palpitations, lightheadedness or syncope.  Complains of intermittent dyspnea.  Also has not a.m. cough.  Did not tolerate Entresto but is tolerating Wilder Glade, started for his diabetes.  Granddaughter died of a brain tumor  Date Cr K Hgb  6/19 1.17 4.3 15.5  2/20 1.33 4.3    7/21 1.25 4.2 13.9  2/23 1.31 4.2   6/23 1.27 4.5 15.2   DATE TEST EF   11/14 Echo   55-65 %   9/15 LHC  No obstructive CAD  5/18 Echo   35 %   5/18 Echo  47  AV/VV optimization  8/19 Echo  35-40%   7/21 Echo  40-45%   2/23 Echo  35-40% LA mod    Past Medical History:  Diagnosis Date   CHF (congestive heart failure) (HCC)    Chronic combined systolic and diastolic heart failure (HCC)     Diabetes mellitus with neuropathy ([place])     Hypertension    Nonischemic cardiomyopathy ([place])     a. s/p MDT CRTD   Orthostatic lightheadedness      Past Surgical History:  Procedure Laterality Date   BIV ICD GENERATOR CHANGEOUT N/A 09/28/2021   Procedure: BIV ICD GENERATOR CHANGEOUT;  Surgeon: [name], MD;  Location: [facility];  Service: Cardiovascular;  Laterality: N/A;   CARDIAC CATHETERIZATION  12/31/1998   Bi-plane cine left ventriculography revealed low-normal left ventricular function with an ejection fraction in the 45% to 50% range.   CORONARY ARTERY BYPASS GRAFT     DOPPLER ECHOCARDIOGRAPHY  04/28/2011   LVEF 41% by simpson's borderline concentric LVH, global hypokinesis with regional variation. Stage 1 diastolic  dysfunction, normal LV filling pressure. Normal LA size. Right heart pacing wires noted. Trace MR, TR. Normal RVSP   EP IMPLANTABLE DEVICE N/A 09/16/2014   Procedure: ICD Generator Changeout;  Surgeon: [name], MD;  Location: [facility];  Service: Cardiovascular;  Laterality: N/A;   LEFT HEART CATHETERIZATION WITH CORONARY ANGIOGRAM N/A 12/11/2013   Procedure: LEFT HEART CATHETERIZATION WITH CORONARY ANGIOGRAM;  Surgeon: [name], MD;  Location: [facility] CATH LAB;  Service: Cardiovascular;  Laterality: N/A;   NM MYOCAR PERF EJECTION FRACTION  04/03/2009, 11/18/2005   The post stress myocardial perfusion images show a normal pattern of perfusion in all regions. The post-stress ejection fraction is 50%. No significant wall motion abnormalities noted. This is a low risk scan.    Current Outpatient Medications  Medication Sig Dispense Refill   carvedilol (COREG) 12.5 MG tablet Take 1 tablet (12.5 mg total) by mouth 2 (two) times daily. 180 tablet 3   dapagliflozin propanediol (FARXIGA) 10 MG TABS tablet Take 10 mg by mouth daily.     furosemide (LASIX) 20 MG tablet Every other day (3x per week) (Patient taking differently: Take 20 mg by mouth. Every other day (3x per week) Patient has a monitor. The heat clinic call and tell he when to take medication) 63 tablet  3   glimepiride (AMARYL) 4 MG tablet Take 4 mg by mouth 2 (two) times daily.     lovastatin (MEVACOR) 20 MG tablet Take 20 mg by mouth at bedtime.     spironolactone (ALDACTONE) 25 MG tablet TAKE 1/2 TABLET(12.5 MG) BY MOUTH DAILY 45 tablet 3   valsartan (DIOVAN) 80 MG tablet TAKE 1 TABLET(80 MG) BY MOUTH DAILY 90 tablet 3   metFORMIN (GLUCOPHAGE-XR) 500 MG 24 hr tablet Take 500 mg by mouth daily with supper. (Patient not taking: Reported on 01/01/2022)     No current facility-administered medications for this visit.    Allergies  Allergen Reactions   Entresto [Sacubitril-Valsartan]     Fatigue, joint pain. Ok on valsartan  alone   Metformin Hcl Other (See Comments)   Codeine Rash    Review of Systems negative except from HPI and PMH  Physical Exam: BP 104/62   Pulse 72   Ht '5\' 10"'$  (1.778 m)   Wt 227 lb (103 kg)   SpO2 95%   BMI 32.57 kg/m  Well developed and well nourished in no acute distress HENT normal Neck supple with JVP-flat Clear Device pocket well healed; without hematoma or erythema.  There is no tethering  Regular rate and rhythm, no  murmur Abd-soft with active BS No Clubbing cyanosis  edema Skin-warm and dry A & Oriented  Grossly normal sensory and motor function  ECG sinus with P synchronous pacing at 72 Intervals 15/14/44 QRS negative lead I and rS lead V1   EKG sinus with P synchronous pacing.  Initial ECG showed QRS morphology that was negative in lead V I and positive in lead I.  Reviewing tracings back to 9/21 they were upright in lead V1 and negative in lead I  We will continue up again it was biphasic in lead V1 and negative in lead I.  We increased the LV offset from 0---20 ms with improvement in the operating QRS morphology lead V1   Assessment and  Plan  Nonischemic cardiomyopathy-   Congestive heart failure- chronic-diastolic  Diabetes  Implantable defibrillator-Medtronic- CRT device    High Risk Medication Surveillance Aldactone  Vertigo  Vaccination status negative   Heart failure status is stable.  There are some variability in his dyspnea.  Does not seem to change with his diuretics.  Unable to tolerate Entresto.  But now on valsartan, spironolactone, carvedilol and Farxiga and tolerating these well.  The auto capture feature had resulted in high LV output.  We will reprogram the 2.5 at 0.8 ms which gives [place] 1/2 V above threshold

## 2022-01-04 ENCOUNTER — Ambulatory Visit (INDEPENDENT_AMBULATORY_CARE_PROVIDER_SITE_OTHER): Payer: Medicare Other

## 2022-01-04 ENCOUNTER — Telehealth: Payer: Self-pay

## 2022-01-04 DIAGNOSIS — I428 Other cardiomyopathies: Secondary | ICD-10-CM

## 2022-01-04 DIAGNOSIS — I5022 Chronic systolic (congestive) heart failure: Secondary | ICD-10-CM

## 2022-01-04 DIAGNOSIS — Z95810 Presence of automatic (implantable) cardiac defibrillator: Secondary | ICD-10-CM | POA: Diagnosis not present

## 2022-01-04 DIAGNOSIS — I5042 Chronic combined systolic (congestive) and diastolic (congestive) heart failure: Secondary | ICD-10-CM | POA: Diagnosis not present

## 2022-01-04 NOTE — Telephone Encounter (Signed)
LMOVM letting the patient know that his monitor is working well with his new ICD.

## 2022-01-08 ENCOUNTER — Telehealth: Payer: Self-pay

## 2022-01-08 LAB — CUP PACEART REMOTE DEVICE CHECK
Date Time Interrogation Session: 20231002170447
Implantable Lead Implant Date: 20060323
Implantable Lead Implant Date: 20060323
Implantable Lead Implant Date: 20110420
Implantable Lead Location: 753858
Implantable Lead Location: 753859
Implantable Lead Location: 753860
Implantable Lead Model: 4193
Implantable Lead Model: 5076
Implantable Lead Model: 7121
Implantable Pulse Generator Implant Date: 20230626

## 2022-01-08 NOTE — Telephone Encounter (Signed)
Remote ICM transmission received.  Attempted call to patient regarding ICM remote transmission and left detailed message per DPR.  Advised to return call for any fluid symptoms or questions. Next ICM remote transmission scheduled 02/15/2022.    

## 2022-01-08 NOTE — Progress Notes (Signed)
EPIC Encounter for ICM Monitoring  Patient Name: Russell Fuentes is a 71 y.o. male Date: 01/08/2022 Primary Care Physican: [name], MD Primary Cardiologist: [name] Electrophysiologist: Vergie Living Pacing: 97.3%         09/24/2021 Weight: 220 lbs   Time in AT/AF  <0.1 hr/day (<0.1%)                                                            Attempted call to patient and unable to reach.  Left detailed message per DPR regarding transmission. Transmission reviewed.    Optivol Thoracic impedance normal but was suggesting intermittent days with possible fluid accumulation within the last month.      Prescribed:  Furosemide 20 mg Take 1 tablet (20 mg total) by mouth every other day (3 x week). Spironolactone 25 mg take 0.5 tablet (12.5 mg total) by mouth daily Farxiga 10 mg take 1 tablet daily    Labs: 09/09/2021 Creatinine 1.27, BUN 17, Potasium 4.5, Sodium 138, GFR 60 05/14/2021 Creatinine 1.31, BUN 21, Potassium 4.2, Sodium 138, GFR 58 A complete set of results can be found in Results Review.   Recommendations:  Left voice mail with ICM number and encouraged to call if experiencing any fluid symptoms.   Follow-up plan: ICM clinic phone appointment on 02/15/2022.  91 day device clinic remote transmission 04/06/2022.       EP/Cardiology Office Visits:  Recall 09/28/2022 with Dr [name].     Copy of ICM check sent to Dr. [name].   3 month ICM trend: 01/04/2022.    12-14 Month ICM trend:     Rosalene Billings, RN 01/08/2022 8:57 AM

## 2022-01-14 DIAGNOSIS — R0981 Nasal congestion: Secondary | ICD-10-CM | POA: Diagnosis not present

## 2022-01-14 DIAGNOSIS — H9203 Otalgia, bilateral: Secondary | ICD-10-CM | POA: Diagnosis not present

## 2022-01-20 NOTE — Progress Notes (Signed)
Remote ICD transmission.   

## 2022-02-01 DIAGNOSIS — Z23 Encounter for immunization: Secondary | ICD-10-CM | POA: Diagnosis not present

## 2022-02-09 ENCOUNTER — Other Ambulatory Visit: Payer: Self-pay | Admitting: *Deleted

## 2022-02-09 DIAGNOSIS — I428 Other cardiomyopathies: Secondary | ICD-10-CM

## 2022-02-09 MED ORDER — FUROSEMIDE 20 MG PO TABS: ORAL_TABLET | ORAL | 3 refills | Status: DC

## 2022-02-15 ENCOUNTER — Ambulatory Visit (INDEPENDENT_AMBULATORY_CARE_PROVIDER_SITE_OTHER): Payer: Medicare Other

## 2022-02-15 DIAGNOSIS — I5022 Chronic systolic (congestive) heart failure: Secondary | ICD-10-CM | POA: Diagnosis not present

## 2022-02-15 DIAGNOSIS — I5042 Chronic combined systolic (congestive) and diastolic (congestive) heart failure: Secondary | ICD-10-CM

## 2022-02-19 NOTE — Progress Notes (Signed)
EPIC Encounter for ICM Monitoring  Patient Name: Russell Fuentes is a 71 y.o. male Date: 02/19/2022 Primary Care Physican: [name], MD Primary Cardiologist: [name] Electrophysiologist: Vergie Living Pacing: 97.0%         09/24/2021 Weight: 220 lbs 01/01/2022 Office Weight: 227 lbs   Time in AT/AF  <0.1 hr/day (<0.1%)                                                            Spoke with patient and heart failure questions reviewed.  Transmission results reviewed.  Pt asymptomatic for fluid accumulation.  He's had vertigo twice in the last couple of weeks.   Diet:  [employer] foods 3 times a week.  Discussed limiting salt and fluid intake   Optivol Thoracic impedance suggesting possible fluid accumulation starting 11/5 and returned to normal 11/13.   Prescribed:  Furosemide 20 mg Take 1 tablet (20 mg total) by mouth every other day (3 x week). Spironolactone 25 mg take 0.5 tablet (12.5 mg total) by mouth daily Farxiga 10 mg take 1 tablet daily    Labs: 09/09/2021 Creatinine 1.27, BUN 17, Potassium 4.5, Sodium 138, GFR 60 05/14/2021 Creatinine 1.31, BUN 21, Potassium 4.2, Sodium 138, GFR 58 A complete set of results can be found in Results Review.   Recommendations:  Recommendation to limit salt intake to 2000 mg daily and fluid intake to 64 oz daily.  Encouraged to avoid restaurant foods if possible.  Encouraged to call if experiencing any fluid symptoms.    Follow-up plan: ICM clinic phone appointment on 03/22/2022.  91 day device clinic remote transmission 04/06/2022.       EP/Cardiology Office Visits:  Recall 09/28/2022 with Dr [name].     Copy of ICM check sent to Dr. [name].    3 month ICM trend: 02/15/2022.    12-14 Month ICM trend:     Rosalene Billings, RN 02/19/2022 4:02 PM

## 2022-03-10 DIAGNOSIS — N183 Chronic kidney disease, stage 3 unspecified: Secondary | ICD-10-CM | POA: Diagnosis not present

## 2022-03-10 DIAGNOSIS — E782 Mixed hyperlipidemia: Secondary | ICD-10-CM | POA: Diagnosis not present

## 2022-03-10 DIAGNOSIS — I130 Hypertensive heart and chronic kidney disease with heart failure and stage 1 through stage 4 chronic kidney disease, or unspecified chronic kidney disease: Secondary | ICD-10-CM | POA: Diagnosis not present

## 2022-03-10 DIAGNOSIS — Z950 Presence of cardiac pacemaker: Secondary | ICD-10-CM | POA: Diagnosis not present

## 2022-03-10 DIAGNOSIS — I5022 Chronic systolic (congestive) heart failure: Secondary | ICD-10-CM | POA: Diagnosis not present

## 2022-03-10 DIAGNOSIS — E669 Obesity, unspecified: Secondary | ICD-10-CM | POA: Diagnosis not present

## 2022-03-10 DIAGNOSIS — E1122 Type 2 diabetes mellitus with diabetic chronic kidney disease: Secondary | ICD-10-CM | POA: Diagnosis not present

## 2022-03-10 DIAGNOSIS — Z1211 Encounter for screening for malignant neoplasm of colon: Secondary | ICD-10-CM | POA: Diagnosis not present

## 2022-03-11 ENCOUNTER — Encounter: Payer: Self-pay | Admitting: Internal Medicine

## 2022-03-22 ENCOUNTER — Ambulatory Visit (INDEPENDENT_AMBULATORY_CARE_PROVIDER_SITE_OTHER): Payer: Medicare Other

## 2022-03-22 DIAGNOSIS — I5022 Chronic systolic (congestive) heart failure: Secondary | ICD-10-CM

## 2022-03-22 DIAGNOSIS — Z95810 Presence of automatic (implantable) cardiac defibrillator: Secondary | ICD-10-CM

## 2022-03-24 NOTE — Progress Notes (Signed)
EPIC Encounter for ICM Monitoring  Patient Name: Russell Fuentes is a 71 y.o. male Date: 03/24/2022 Primary Care Physican: [name], MD Primary Cardiologist: [name] Electrophysiologist: Vergie Living Pacing: 97.1%         09/24/2021 Weight: 220 lbs 01/01/2022 Office Weight: 227 lbs 03/24/2022 Weight: 227 lbs   Time in AT/AF  <0.1 hr/day (<0.1%)                                                            Spoke with patient and heart failure questions reviewed.  Transmission results reviewed.  Pt asymptomatic for fluid accumulation.     Diet:  [employer] foods 3 times a week.  Discussed limiting salt and fluid intake   Optivol Thoracic impedance suggesting normal fluid levels but intermittent days with possible fluid accumulation within the last month.   Prescribed:  Furosemide 20 mg Take 1 tablet (20 mg total) by mouth every other day (3 x week). Spironolactone 25 mg take 0.5 tablet (12.5 mg total) by mouth daily Farxiga 10 mg take 1 tablet daily    Labs: 09/09/2021 Creatinine 1.27, BUN 17, Potassium 4.5, Sodium 138, GFR 60 05/14/2021 Creatinine 1.31, BUN 21, Potassium 4.2, Sodium 138, GFR 58 A complete set of results can be found in Results Review.   Recommendations:   Encouraged to call if experiencing any fluid symptoms.    Follow-up plan: ICM clinic phone appointment on 04/26/2022.  91 day device clinic remote transmission 04/06/2022.       EP/Cardiology Office Visits:  Recall 09/28/2022 with Dr [name].     Copy of ICM check sent to Dr. [name].   3 month ICM trend: 03/22/2022.    12-14 Month ICM trend:     Rosalene Billings, RN 03/24/2022 3:21 PM

## 2022-04-04 DIAGNOSIS — J324 Chronic pansinusitis: Secondary | ICD-10-CM | POA: Diagnosis not present

## 2022-04-04 DIAGNOSIS — H6692 Otitis media, unspecified, left ear: Secondary | ICD-10-CM | POA: Diagnosis not present

## 2022-04-06 ENCOUNTER — Ambulatory Visit (INDEPENDENT_AMBULATORY_CARE_PROVIDER_SITE_OTHER): Payer: Medicare Other

## 2022-04-06 DIAGNOSIS — I428 Other cardiomyopathies: Secondary | ICD-10-CM

## 2022-04-06 DIAGNOSIS — I5022 Chronic systolic (congestive) heart failure: Secondary | ICD-10-CM

## 2022-04-06 LAB — CUP PACEART REMOTE DEVICE CHECK
Battery Remaining Longevity: 74 mo
Battery Voltage: 3 V
Brady Statistic AP VP Percent: 45.13 %
Brady Statistic AP VS Percent: 0.67 %
Brady Statistic AS VP Percent: 52.27 %
Brady Statistic AS VS Percent: 1.93 %
Brady Statistic RA Percent Paced: 44.22 %
Brady Statistic RV Percent Paced: 2.65 %
Date Time Interrogation Session: 20240102033425
HighPow Impedance: 46 Ohm
HighPow Impedance: 60 Ohm
Implantable Lead Connection Status: 753985
Implantable Lead Connection Status: 753985
Implantable Lead Connection Status: 753985
Implantable Lead Implant Date: 20060323
Implantable Lead Implant Date: 20060323
Implantable Lead Implant Date: 20110420
Implantable Lead Location: 753858
Implantable Lead Location: 753859
Implantable Lead Location: 753860
Implantable Lead Model: 4193
Implantable Lead Model: 5076
Implantable Lead Model: 7121
Implantable Pulse Generator Implant Date: 20230626
Lead Channel Impedance Value: 304 Ohm
Lead Channel Impedance Value: 361 Ohm
Lead Channel Impedance Value: 399 Ohm
Lead Channel Impedance Value: 4047 Ohm
Lead Channel Impedance Value: 4047 Ohm
Lead Channel Impedance Value: 513 Ohm
Lead Channel Pacing Threshold Amplitude: 0.625 V
Lead Channel Pacing Threshold Amplitude: 1.25 V
Lead Channel Pacing Threshold Amplitude: 2 V
Lead Channel Pacing Threshold Pulse Width: 0.4 ms
Lead Channel Pacing Threshold Pulse Width: 0.4 ms
Lead Channel Pacing Threshold Pulse Width: 1 ms
Lead Channel Sensing Intrinsic Amplitude: 26.125 mV
Lead Channel Sensing Intrinsic Amplitude: 26.125 mV
Lead Channel Sensing Intrinsic Amplitude: 4.75 mV
Lead Channel Sensing Intrinsic Amplitude: 4.75 mV
Lead Channel Setting Pacing Amplitude: 1.5 V
Lead Channel Setting Pacing Amplitude: 2 V
Lead Channel Setting Pacing Amplitude: 2.5 V
Lead Channel Setting Pacing Pulse Width: 0.4 ms
Lead Channel Setting Pacing Pulse Width: 1 ms
Lead Channel Setting Sensing Sensitivity: 0.6 mV
Zone Setting Status: 755011

## 2022-04-12 DIAGNOSIS — H6502 Acute serous otitis media, left ear: Secondary | ICD-10-CM | POA: Diagnosis not present

## 2022-04-21 DIAGNOSIS — H9120 Sudden idiopathic hearing loss, unspecified ear: Secondary | ICD-10-CM | POA: Diagnosis not present

## 2022-04-22 DIAGNOSIS — H903 Sensorineural hearing loss, bilateral: Secondary | ICD-10-CM | POA: Diagnosis not present

## 2022-04-22 DIAGNOSIS — H90A21 Sensorineural hearing loss, unilateral, right ear, with restricted hearing on the contralateral side: Secondary | ICD-10-CM | POA: Diagnosis not present

## 2022-04-22 DIAGNOSIS — H90A32 Mixed conductive and sensorineural hearing loss, unilateral, left ear with restricted hearing on the contralateral side: Secondary | ICD-10-CM | POA: Diagnosis not present

## 2022-04-26 ENCOUNTER — Ambulatory Visit: Payer: Medicare Other | Attending: Internal Medicine

## 2022-04-26 DIAGNOSIS — I5022 Chronic systolic (congestive) heart failure: Secondary | ICD-10-CM

## 2022-04-26 DIAGNOSIS — Z95810 Presence of automatic (implantable) cardiac defibrillator: Secondary | ICD-10-CM | POA: Diagnosis not present

## 2022-04-28 DIAGNOSIS — H938X2 Other specified disorders of left ear: Secondary | ICD-10-CM | POA: Diagnosis not present

## 2022-04-28 DIAGNOSIS — H93A2 Pulsatile tinnitus, left ear: Secondary | ICD-10-CM | POA: Diagnosis not present

## 2022-04-28 DIAGNOSIS — H6982 Other specified disorders of Eustachian tube, left ear: Secondary | ICD-10-CM | POA: Diagnosis not present

## 2022-04-28 DIAGNOSIS — H90A21 Sensorineural hearing loss, unilateral, right ear, with restricted hearing on the contralateral side: Secondary | ICD-10-CM | POA: Diagnosis not present

## 2022-04-28 DIAGNOSIS — H90A32 Mixed conductive and sensorineural hearing loss, unilateral, left ear with restricted hearing on the contralateral side: Secondary | ICD-10-CM | POA: Diagnosis not present

## 2022-04-28 DIAGNOSIS — Z79899 Other long term (current) drug therapy: Secondary | ICD-10-CM | POA: Diagnosis not present

## 2022-04-28 DIAGNOSIS — H6992 Unspecified Eustachian tube disorder, left ear: Secondary | ICD-10-CM | POA: Diagnosis not present

## 2022-04-28 DIAGNOSIS — Z7984 Long term (current) use of oral hypoglycemic drugs: Secondary | ICD-10-CM | POA: Diagnosis not present

## 2022-04-28 DIAGNOSIS — E119 Type 2 diabetes mellitus without complications: Secondary | ICD-10-CM | POA: Diagnosis not present

## 2022-05-03 NOTE — Progress Notes (Signed)
EPIC Encounter for ICM Monitoring  Patient Name: Russell Fuentes is a 72 y.o. male Date: 05/03/2022 Primary Care Physican: [name], MD Primary Cardiologist: [name] Electrophysiologist: Vergie Living Pacing: 93.9%         09/24/2021 Weight: 220 lbs 01/01/2022 Office Weight: 227 lbs 03/24/2022 Weight: 227 lbs 04/28/2022 Weight: 227 lbs   Time in AT/AF  0.0 hr/day (0.0%)                                                            Spoke with patient and heart failure questions reviewed.  Transmission results reviewed.  Pt asymptomatic for fluid accumulation.   He's had ear infection which has resolved but he's hearing has been affected.     Diet:  [employer] foods 3 times a week.  Discussed limiting salt and fluid intake   Optivol Thoracic impedance suggesting normal fluid levels.   Prescribed:  Furosemide 20 mg Take 1 tablet (20 mg total) by mouth every other day (3 x week). Spironolactone 25 mg take 0.5 tablet (12.5 mg total) by mouth daily Farxiga 10 mg take 1 tablet daily    Labs: 03/10/2022 Creatinine 1.20, BUN 14, Potassium 4.5, Sodium 143, GFR 64 09/09/2021 Creatinine 1.27, BUN 17, Potassium 4.5, Sodium 138, GFR 60 05/14/2021 Creatinine 1.31, BUN 21, Potassium 4.2, Sodium 138, GFR 58 A complete set of results can be found in Results Review.   Recommendations:   Encouraged to call if experiencing any fluid symptoms.    Follow-up plan: ICM clinic phone appointment on 05/31/2022.  91 day device clinic remote transmission 07/06/2022.       EP/Cardiology Office Visits:  Recall 09/28/2022 with Dr [name].     Copy of ICM check sent to Dr. [name].   3 month ICM trend: 04/26/2022.    12-14 Month ICM trend:     Rosalene Billings, RN 05/03/2022 12:27 PM

## 2022-05-04 DIAGNOSIS — H903 Sensorineural hearing loss, bilateral: Secondary | ICD-10-CM | POA: Diagnosis not present

## 2022-05-04 DIAGNOSIS — H938X2 Other specified disorders of left ear: Secondary | ICD-10-CM | POA: Diagnosis not present

## 2022-05-04 DIAGNOSIS — Z7984 Long term (current) use of oral hypoglycemic drugs: Secondary | ICD-10-CM | POA: Diagnosis not present

## 2022-05-04 DIAGNOSIS — H90A32 Mixed conductive and sensorineural hearing loss, unilateral, left ear with restricted hearing on the contralateral side: Secondary | ICD-10-CM | POA: Diagnosis not present

## 2022-05-04 DIAGNOSIS — R42 Dizziness and giddiness: Secondary | ICD-10-CM | POA: Diagnosis not present

## 2022-05-04 DIAGNOSIS — E119 Type 2 diabetes mellitus without complications: Secondary | ICD-10-CM | POA: Diagnosis not present

## 2022-05-04 DIAGNOSIS — Z974 Presence of external hearing-aid: Secondary | ICD-10-CM | POA: Diagnosis not present

## 2022-05-07 NOTE — Progress Notes (Signed)
Remote ICD transmission.   

## 2022-05-12 DIAGNOSIS — Z77122 Contact with and (suspected) exposure to noise: Secondary | ICD-10-CM | POA: Diagnosis not present

## 2022-05-12 DIAGNOSIS — H9042 Sensorineural hearing loss, unilateral, left ear, with unrestricted hearing on the contralateral side: Secondary | ICD-10-CM | POA: Diagnosis not present

## 2022-05-12 DIAGNOSIS — Z974 Presence of external hearing-aid: Secondary | ICD-10-CM | POA: Diagnosis not present

## 2022-05-12 DIAGNOSIS — H938X2 Other specified disorders of left ear: Secondary | ICD-10-CM | POA: Diagnosis not present

## 2022-05-12 DIAGNOSIS — Z961 Presence of intraocular lens: Secondary | ICD-10-CM | POA: Diagnosis not present

## 2022-05-12 DIAGNOSIS — E119 Type 2 diabetes mellitus without complications: Secondary | ICD-10-CM | POA: Diagnosis not present

## 2022-05-12 DIAGNOSIS — H6992 Unspecified Eustachian tube disorder, left ear: Secondary | ICD-10-CM | POA: Diagnosis not present

## 2022-05-12 DIAGNOSIS — H9122 Sudden idiopathic hearing loss, left ear: Secondary | ICD-10-CM | POA: Diagnosis not present

## 2022-05-12 DIAGNOSIS — R42 Dizziness and giddiness: Secondary | ICD-10-CM | POA: Diagnosis not present

## 2022-05-18 DIAGNOSIS — L570 Actinic keratosis: Secondary | ICD-10-CM | POA: Diagnosis not present

## 2022-05-18 DIAGNOSIS — L820 Inflamed seborrheic keratosis: Secondary | ICD-10-CM | POA: Diagnosis not present

## 2022-05-18 DIAGNOSIS — D1801 Hemangioma of skin and subcutaneous tissue: Secondary | ICD-10-CM | POA: Diagnosis not present

## 2022-05-18 DIAGNOSIS — L821 Other seborrheic keratosis: Secondary | ICD-10-CM | POA: Diagnosis not present

## 2022-05-18 DIAGNOSIS — Z85828 Personal history of other malignant neoplasm of skin: Secondary | ICD-10-CM | POA: Diagnosis not present

## 2022-05-20 ENCOUNTER — Telehealth: Payer: Self-pay | Admitting: Pharmacist

## 2022-05-20 NOTE — Telephone Encounter (Signed)
Pt called clinic, states he received a letter in the mail that there are lawsuits going on for lovastatin and valsartan. He takes both medications and is wondering if he needs to be concerned.  Advised pt there are no FDA recalls or other issues with valsartan or lovastatin currently, seems like a weird spam ad maybe. He should be fine to continue both meds. He prefers to bring by the letter he received by the office so we can take a look at it to confirm.

## 2022-05-31 ENCOUNTER — Ambulatory Visit: Payer: Medicare Other | Attending: Internal Medicine

## 2022-05-31 DIAGNOSIS — I5022 Chronic systolic (congestive) heart failure: Secondary | ICD-10-CM

## 2022-05-31 DIAGNOSIS — Z95810 Presence of automatic (implantable) cardiac defibrillator: Secondary | ICD-10-CM

## 2022-06-04 NOTE — Progress Notes (Signed)
EPIC Encounter for ICM Monitoring  Patient Name: Russell Fuentes is a 72 y.o. male Date: 06/04/2022 Primary Care Physican: [name], MD Primary Cardiologist: [name] Electrophysiologist: Vergie Living Pacing: 94.4%         09/24/2021 Weight: 220 lbs 01/01/2022 Office Weight: 227 lbs 03/24/2022 Weight: 227 lbs 04/28/2022 Weight: 227 lbs   Time in AT/AF  <0.1 hr/day (<0.1%)                                                  Spoke with patient and heart failure questions reviewed.  Transmission results reviewed.  Pt asymptomatic for fluid accumulation.   He's had ear infection which has resolved but he's hearing has been affected.     Diet:  Typically eats restaurant foods 3 times a week.  Discussed limiting salt and fluid intake   Optivol Thoracic impedance suggesting normal fluid levels with the exception of possible fluid accumulation starting 1/21-2/6.   Prescribed:  Furosemide 20 mg Take 1 tablet (20 mg total) by mouth every other day (3 x week). Spironolactone 25 mg take 0.5 tablet (12.5 mg total) by mouth daily Farxiga 10 mg take 1 tablet daily    Labs: 03/10/2022 Creatinine 1.20, BUN 14, Potassium 4.5, Sodium 143, GFR 64 09/09/2021 Creatinine 1.27, BUN 17, Potassium 4.5, Sodium 138, GFR 60 05/14/2021 Creatinine 1.31, BUN 21, Potassium 4.2, Sodium 138, GFR 58 A complete set of results can be found in Results Review.   Recommendations:   Encouraged to call if experiencing any fluid symptoms.    Follow-up plan: ICM clinic phone appointment on 07/07/2022.  91 day device clinic remote transmission 07/06/2022.       EP/Cardiology Office Visits:  Recall 09/28/2022 with Dr [name].     Copy of ICM check sent to Dr. [name].    3 month ICM trend: 06/01/2022.    12-14 Month ICM trend:     Rosalene Billings, RN 06/04/2022 3:41 PM

## 2022-06-15 DIAGNOSIS — M47816 Spondylosis without myelopathy or radiculopathy, lumbar region: Secondary | ICD-10-CM | POA: Diagnosis not present

## 2022-06-21 DIAGNOSIS — J309 Allergic rhinitis, unspecified: Secondary | ICD-10-CM | POA: Diagnosis not present

## 2022-06-23 ENCOUNTER — Telehealth: Payer: Self-pay | Admitting: Internal Medicine

## 2022-06-23 DIAGNOSIS — M47816 Spondylosis without myelopathy or radiculopathy, lumbar region: Secondary | ICD-10-CM | POA: Diagnosis not present

## 2022-06-23 NOTE — Telephone Encounter (Signed)
Pt wants to know if he can have an MRI with his device he has now

## 2022-06-23 NOTE — Telephone Encounter (Signed)
Called patient to advise his device and leads are not compatible due to a Medtronic ICD and [employer] lead. Patient appreciative of call.

## 2022-07-06 ENCOUNTER — Ambulatory Visit (INDEPENDENT_AMBULATORY_CARE_PROVIDER_SITE_OTHER): Payer: Medicare Other

## 2022-07-06 DIAGNOSIS — I428 Other cardiomyopathies: Secondary | ICD-10-CM

## 2022-07-07 ENCOUNTER — Ambulatory Visit: Payer: Medicare Other | Attending: Internal Medicine

## 2022-07-07 DIAGNOSIS — Z95810 Presence of automatic (implantable) cardiac defibrillator: Secondary | ICD-10-CM

## 2022-07-07 DIAGNOSIS — I5022 Chronic systolic (congestive) heart failure: Secondary | ICD-10-CM

## 2022-07-07 LAB — CUP PACEART REMOTE DEVICE CHECK
Battery Remaining Longevity: 74 mo
Battery Voltage: 3 V
Brady Statistic AP VP Percent: 50.88 %
Brady Statistic AP VS Percent: 0.78 %
Brady Statistic AS VP Percent: 46.03 %
Brady Statistic AS VS Percent: 2.31 %
Brady Statistic RA Percent Paced: 50.08 %
Brady Statistic RV Percent Paced: 5.15 %
Date Time Interrogation Session: 20240402022830
HighPow Impedance: 52 Ohm
HighPow Impedance: 65 Ohm
Implantable Lead Connection Status: 753985
Implantable Lead Connection Status: 753985
Implantable Lead Connection Status: 753985
Implantable Lead Implant Date: 20060323
Implantable Lead Implant Date: 20060323
Implantable Lead Implant Date: 20110420
Implantable Lead Location: 753858
Implantable Lead Location: 753859
Implantable Lead Location: 753860
Implantable Lead Model: 4193
Implantable Lead Model: 5076
Implantable Lead Model: 7121
Implantable Pulse Generator Implant Date: 20230626
Lead Channel Impedance Value: 304 Ohm
Lead Channel Impedance Value: 399 Ohm
Lead Channel Impedance Value: 4047 Ohm
Lead Channel Impedance Value: 4047 Ohm
Lead Channel Impedance Value: 418 Ohm
Lead Channel Impedance Value: 532 Ohm
Lead Channel Pacing Threshold Amplitude: 0.625 V
Lead Channel Pacing Threshold Amplitude: 1.125 V
Lead Channel Pacing Threshold Amplitude: 1.75 V
Lead Channel Pacing Threshold Pulse Width: 0.4 ms
Lead Channel Pacing Threshold Pulse Width: 0.4 ms
Lead Channel Pacing Threshold Pulse Width: 1 ms
Lead Channel Sensing Intrinsic Amplitude: 15.75 mV
Lead Channel Sensing Intrinsic Amplitude: 15.75 mV
Lead Channel Sensing Intrinsic Amplitude: 3.375 mV
Lead Channel Sensing Intrinsic Amplitude: 3.375 mV
Lead Channel Setting Pacing Amplitude: 1.5 V
Lead Channel Setting Pacing Amplitude: 2 V
Lead Channel Setting Pacing Amplitude: 2.5 V
Lead Channel Setting Pacing Pulse Width: 0.4 ms
Lead Channel Setting Pacing Pulse Width: 1 ms
Lead Channel Setting Sensing Sensitivity: 0.6 mV
Zone Setting Status: 755011

## 2022-07-07 NOTE — Progress Notes (Signed)
EPIC Encounter for ICM Monitoring  Patient Name: Russell Fuentes is a 72 y.o. male Date: 07/07/2022 Primary Care Physican: Lupita Raider, MD Primary Cardiologist: Graciela Husbands Electrophysiologist: Joycelyn Schmid Pacing: 94.2%         09/24/2021 Weight: 220 lbs 01/01/2022 Office Weight: 227 lbs 03/24/2022 Weight: 227 lbs 04/28/2022 Weight: 227 lbs   Time in AT/AF  <0.1 hr/day (<0.1%)                                                  Spoke with patient and heart failure questions reviewed.  Transmission results reviewed.  Pt asymptomatic for fluid accumulation.     Diet:  Typically eats restaurant foods 3 times a week.  Discussed limiting salt and fluid intake.   Optivol Thoracic impedance suggesting normal fluid levels with the exception of possible fluid accumulation starting 3/14-3/31.   Prescribed:  Furosemide 20 mg Take 1 tablet (20 mg total) by mouth every other day (3 x week). Spironolactone 25 mg take 0.5 tablet (12.5 mg total) by mouth daily Farxiga 10 mg take 1 tablet daily    Labs: 03/10/2022 Creatinine 1.20, BUN 14, Potassium 4.5, Sodium 143, GFR 64 09/09/2021 Creatinine 1.27, BUN 17, Potassium 4.5, Sodium 138, GFR 60 05/14/2021 Creatinine 1.31, BUN 21, Potassium 4.2, Sodium 138, GFR 58 A complete set of results can be found in Results Review.   Recommendations:   Encouraged to call if experiencing any fluid symptoms.    Follow-up plan: ICM clinic phone appointment on 08/09/2022.  91 day device clinic remote transmission 10/05/2022.       EP/Cardiology Office Visits:  Recall 09/28/2022 with Dr Graciela Husbands.     Copy of ICM check sent to Dr. Graciela Husbands.   3 month ICM trend: 07/05/2022.    12-14 Month ICM trend:     Karie Soda, RN 07/07/2022 8:11 AM

## 2022-07-20 DIAGNOSIS — M47816 Spondylosis without myelopathy or radiculopathy, lumbar region: Secondary | ICD-10-CM | POA: Diagnosis not present

## 2022-07-26 ENCOUNTER — Other Ambulatory Visit: Payer: Self-pay | Admitting: Internal Medicine

## 2022-07-26 DIAGNOSIS — Z86010 Personal history of colonic polyps: Secondary | ICD-10-CM | POA: Diagnosis not present

## 2022-07-26 DIAGNOSIS — R1900 Intra-abdominal and pelvic swelling, mass and lump, unspecified site: Secondary | ICD-10-CM | POA: Diagnosis not present

## 2022-07-26 DIAGNOSIS — Z800 Family history of malignant neoplasm of digestive organs: Secondary | ICD-10-CM | POA: Diagnosis not present

## 2022-07-27 ENCOUNTER — Telehealth: Payer: Self-pay

## 2022-07-27 ENCOUNTER — Telehealth: Payer: Self-pay | Admitting: *Deleted

## 2022-07-27 NOTE — Telephone Encounter (Signed)
Pt has been scheduled for tele pre op appt 08/13/22 @ 9 am. Med rec and consent are done.     Patient Consent for Virtual Visit        Russell Fuentes has provided verbal consent on 07/27/2022 for a virtual visit (video or telephone).   CONSENT FOR VIRTUAL VISIT FOR:  Russell Fuentes  By participating in this virtual visit I agree to the following:  I hereby voluntarily request, consent and authorize [facility] and its employed or contracted physicians, physician assistants, nurse practitioners or other licensed health care professionals (the Practitioner), to provide me with telemedicine health care services (the "Services") as deemed necessary by the treating Practitioner. I acknowledge and consent to receive the Services by the Practitioner via telemedicine. I understand that the telemedicine visit will involve communicating with the Practitioner through live audiovisual communication technology and the disclosure of certain medical information by electronic transmission. I acknowledge that I have been given the opportunity to request an in-person assessment or other available alternative prior to the telemedicine visit and am voluntarily participating in the telemedicine visit.  I understand that I have the right to withhold or withdraw my consent to the use of telemedicine in the course of my care at any time, without affecting my right to future care or treatment, and that the Practitioner or I may terminate the telemedicine visit at any time. I understand that I have the right to inspect all information obtained and/or recorded in the course of the telemedicine visit and may receive copies of available information for a reasonable fee.  I understand that some of the potential risks of receiving the Services via telemedicine include:  Delay or interruption in medical evaluation due to technological equipment failure or disruption; Information transmitted may not be sufficient (e.g.  poor resolution of images) to allow for appropriate medical decision making by the Practitioner; and/or  In rare instances, security protocols could fail, causing a breach of personal health information.  Furthermore, I acknowledge that it is my responsibility to provide information about my medical history, conditions and care that is complete and accurate to the best of my ability. I acknowledge that Practitioner's advice, recommendations, and/or decision may be based on factors not within their control, such as incomplete or inaccurate data provided by me or distortions of diagnostic images or specimens that may result from electronic transmissions. I understand that the practice of medicine is not an exact science and that Practitioner makes no warranties or guarantees regarding treatment outcomes. I acknowledge that a copy of this consent can be made available to me via my patient portal [facility] MyChart), or I can request a printed copy by calling the office of [facility].    I understand that my insurance will be billed for this visit.   I have read or had this consent read to me. I understand the contents of this consent, which adequately explains the benefits and risks of the Services being provided via telemedicine.  I have been provided ample opportunity to ask questions regarding this consent and the Services and have had my questions answered to my satisfaction. I give my informed consent for the services to be provided through the use of telemedicine in my medical care

## 2022-07-27 NOTE — Telephone Encounter (Signed)
..     Pre-operative Risk Assessment    Patient Name: Russell Fuentes  DOB: 08/01/1950 MRN: [id number]      Request for Surgical Clearance    Procedure:   COLONOSCOPY  Date of Surgery:  Clearance 08/27/22                                 Surgeon:  DR Lorenso Quarry Surgeon's Group or Practice Name:  [facility] Phone number:  [phone] Fax number:  [phone]   Type of Clearance Requested:   - Medical    Type of Anesthesia:   PROPOFOL   Additional requests/questions:    [name]   07/27/2022, 11:13 AM

## 2022-07-27 NOTE — Telephone Encounter (Signed)
Pt has been scheduled for tele pre op appt 08/13/22 @ 9 am. Med rec and consent are done.

## 2022-07-27 NOTE — Telephone Encounter (Signed)
Primary Cardiologist:None   Preoperative team, please contact this patient and set up a phone call appointment for further preoperative risk assessment. Please obtain consent and complete medication review. Thank you for your help.   I confirm that guidance regarding antiplatelet and oral anticoagulation therapy has been completed and, if necessary, noted below (none requested).  Levi Aland, NP-C  07/27/2022, 12:38 PM [address], [address] Office [phone] Fax [phone]

## 2022-07-30 ENCOUNTER — Ambulatory Visit
Admission: RE | Admit: 2022-07-30 | Discharge: 2022-07-30 | Disposition: A | Discharge status: Home / Self Care | Payer: Medicare Other | Source: Ambulatory Visit | Attending: Internal Medicine | Admitting: Internal Medicine

## 2022-07-30 DIAGNOSIS — R1900 Intra-abdominal and pelvic swelling, mass and lump, unspecified site: Secondary | ICD-10-CM

## 2022-07-30 MED ORDER — IOPAMIDOL (ISOVUE-300) INJECTION 61%
100.0000 mL | Freq: Once | INTRAVENOUS | Status: AC | PRN
  Administered 2022-07-30: 100 mL via INTRAVENOUS

## 2022-08-09 ENCOUNTER — Telehealth: Payer: Self-pay

## 2022-08-09 ENCOUNTER — Ambulatory Visit: Payer: Medicare Other | Attending: Internal Medicine

## 2022-08-09 DIAGNOSIS — I5022 Chronic systolic (congestive) heart failure: Secondary | ICD-10-CM | POA: Diagnosis not present

## 2022-08-09 DIAGNOSIS — Z95810 Presence of automatic (implantable) cardiac defibrillator: Secondary | ICD-10-CM | POA: Diagnosis not present

## 2022-08-09 NOTE — Progress Notes (Signed)
EPIC Encounter for ICM Monitoring  Patient Name: Russell Fuentes is a 72 y.o. male Date: 08/09/2022 Primary Care Physican: Lupita Raider, MD Primary Cardiologist: Graciela Husbands Electrophysiologist: Joycelyn Schmid Pacing: 96.1%         03/24/2022 Weight: 227 lbs 04/28/2022   Weight: 227 lbs   Time in AT/AF  <0.1 hr/day (<0.1%)                                                  Attempted call to patient and unable to reach.  Left detailed message per DPR regarding transmission. Transmission reviewed.    Diet:  Typically eats restaurant foods 3 times a week.  Discussed limiting salt and fluid intake.    Optivol Thoracic impedance suggesting possible fluid accumulation starting 4/15 but trending back close to baseline.   Prescribed:  Furosemide 20 mg Take 1 tablet (20 mg total) by mouth every other day (3 x week). Spironolactone 25 mg take 0.5 tablet (12.5 mg total) by mouth daily Farxiga 10 mg take 1 tablet daily    Labs: 03/10/2022 Creatinine 1.20, BUN 14, Potassium 4.5, Sodium 143, GFR 64 09/09/2021 Creatinine 1.27, BUN 17, Potassium 4.5, Sodium 138, GFR 60 05/14/2021 Creatinine 1.31, BUN 21, Potassium 4.2, Sodium 138, GFR 58 A complete set of results can be found in Results Review.   Recommendations:  Left voice mail with ICM number and encouraged to call if experiencing any fluid symptoms.   Follow-up plan: ICM clinic phone appointment on 09/13/2022.  91 day device clinic remote transmission 10/05/2022.       EP/Cardiology Office Visits:  Recall 09/28/2022 with Dr Graciela Husbands.     Copy of ICM check sent to Dr. Graciela Husbands.   3 month ICM trend: 08/09/2022.    12-14 Month ICM trend:     Karie Soda, RN 08/09/2022 1:55 PM

## 2022-08-09 NOTE — Telephone Encounter (Signed)
Remote ICM transmission received.  Attempted call to patient regarding ICM remote transmission and left detailed message per DPR.  Advised to return call for any fluid symptoms or questions. Next ICM remote transmission scheduled 09/13/2022.    

## 2022-08-11 DIAGNOSIS — H903 Sensorineural hearing loss, bilateral: Secondary | ICD-10-CM | POA: Diagnosis not present

## 2022-08-11 DIAGNOSIS — H9122 Sudden idiopathic hearing loss, left ear: Secondary | ICD-10-CM | POA: Diagnosis not present

## 2022-08-11 DIAGNOSIS — E119 Type 2 diabetes mellitus without complications: Secondary | ICD-10-CM | POA: Diagnosis not present

## 2022-08-11 DIAGNOSIS — Z95810 Presence of automatic (implantable) cardiac defibrillator: Secondary | ICD-10-CM | POA: Diagnosis not present

## 2022-08-11 DIAGNOSIS — R42 Dizziness and giddiness: Secondary | ICD-10-CM | POA: Diagnosis not present

## 2022-08-11 DIAGNOSIS — Z974 Presence of external hearing-aid: Secondary | ICD-10-CM | POA: Diagnosis not present

## 2022-08-11 DIAGNOSIS — H6902 Patulous Eustachian tube, left ear: Secondary | ICD-10-CM | POA: Diagnosis not present

## 2022-08-13 ENCOUNTER — Ambulatory Visit: Payer: Medicare Other | Attending: Family Medicine

## 2022-08-13 DIAGNOSIS — Z01810 Encounter for preprocedural cardiovascular examination: Secondary | ICD-10-CM | POA: Diagnosis not present

## 2022-08-13 DIAGNOSIS — R972 Elevated prostate specific antigen [PSA]: Secondary | ICD-10-CM | POA: Diagnosis not present

## 2022-08-13 NOTE — Progress Notes (Signed)
Virtual Visit via Telephone Note   Because of Russell Fuentes's co-morbid illnesses, he is at least at moderate risk for complications without adequate follow up.  This format is felt to be most appropriate for this patient at this time.  The patient did not have access to video technology/had technical difficulties with video requiring transitioning to audio format only (telephone).  All issues noted in this document were discussed and addressed.  No physical exam could be performed with this format.  Please refer to the patient's chart for his consent to telehealth for [facility].  Evaluation Performed:  Preoperative cardiovascular risk assessment _____________   Date:  08/13/2022   Patient ID:  Russell Fuentes, DOB 04/19/1950, MRN [id number] Patient Location:  Home Provider location:   Office  Primary Care Provider:  Lupita Raider, MD Primary Cardiologist:  None  Chief Complaint / Patient Profile   72 y.o. y/o male with a h/o ICD, CRT-D Medtronic implant for nonischemic cardiomyopathy (generator replacement 2016, 2023), chronic combined systolic and diastolic CHF, diabetes mellitus with neuropathy, hypertension, nonischemic cardiomyopathy, orthostatic lightheadedness who is pending colonoscopy and presents today for telephonic preoperative cardiovascular risk assessment.  History of Present Illness    Russell Fuentes is a 72 y.o. male who presents via audio/video conferencing for a telehealth visit today.  Pt was last seen in cardiology clinic on 01/01/2022 by Dr. Graciela Husbands.  At that time Russell Fuentes was doing well.  Heart failure status was stable.  Some variability in his dyspnea which did not seem to change with diuretics.  Unable to tolerate Entresto but on valsartan, spironolactone, carvedilol, and Farxiga and tolerating well.  The patient is now pending procedure as outlined above. Since his last visit, he said he had a little fluid build up. He took an extra dose  of lasix. He did have some vertigo/dizziness and then it went away. He feels like those symptoms have resolved. He walks everyday. 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 He tries to stay busy. He has scored a 5.62 on the DASI which exceeds the 4 mets minimum requirement.   He tells me that 10 years ago he had a colonoscopy every year and now he can have them less often. His mother passed away at 58 years old from colon cancer.   No medications indicated as needing held.  Past Medical History    Past Medical History:  Diagnosis Date   CHF (congestive heart failure) (HCC)    Chronic combined systolic and diastolic heart failure (HCC)     Diabetes mellitus with neuropathy (HCC)     Hypertension    Nonischemic cardiomyopathy (HCC)     a. s/p MDT CRTD   Orthostatic lightheadedness     Past Surgical History:  Procedure Laterality Date   BIV ICD GENERATOR CHANGEOUT N/A 09/28/2021   Procedure: BIV ICD GENERATOR CHANGEOUT;  Surgeon: Duke Salvia, MD;  Location: [facility] INVASIVE CV LAB;  Service: Cardiovascular;  Laterality: N/A;   CARDIAC CATHETERIZATION  12/31/1998   Bi-plane cine left ventriculography revealed low-normal left ventricular function with an ejection fraction in the 45% to 50% range.   CORONARY ARTERY BYPASS GRAFT     DOPPLER ECHOCARDIOGRAPHY  04/28/2011   LVEF 41% by simpson's borderline concentric LVH, global hypokinesis with regional variation. Stage 1 diastolic dysfunction, normal LV filling pressure. Normal LA size. Right heart pacing wires noted. Trace MR, TR. Normal RVSP   EP IMPLANTABLE DEVICE  N/A 09/16/2014   Procedure: ICD Generator Changeout;  Surgeon: Duke Salvia, MD;  Location: [facility] INVASIVE CV LAB;  Service: Cardiovascular;  Laterality: N/A;   LEFT HEART CATHETERIZATION WITH CORONARY ANGIOGRAM N/A 12/11/2013   Procedure: LEFT HEART CATHETERIZATION WITH CORONARY ANGIOGRAM;  Surgeon: Lesleigh Noe, MD;  Location: [facility] CATH LAB;   Service: Cardiovascular;  Laterality: N/A;   NM MYOCAR PERF EJECTION FRACTION  04/03/2009, 11/18/2005   The post stress myocardial perfusion images show a normal pattern of perfusion in all regions. The post-stress ejection fraction is 50%. No significant wall motion abnormalities noted. This is a low risk scan.    Allergies  Allergies  Allergen Reactions   Entresto [Sacubitril-Valsartan]     Fatigue, joint pain. Ok on valsartan alone   Metformin Hcl Other (See Comments)   Codeine Rash    Home Medications    Prior to Admission medications   Medication Sig Start Date End Date Taking? Authorizing Provider  carvedilol (COREG) 12.5 MG tablet Take 1 tablet (12.5 mg total) by mouth 2 (two) times daily. 09/09/21   Duke Salvia, MD  dapagliflozin propanediol (FARXIGA) 10 MG TABS tablet Take 10 mg by mouth daily. 11/10/20   [provider]  furosemide (LASIX) 20 MG tablet Every other day (3x per week) 02/09/22   Duke Salvia, MD  glimepiride (AMARYL) 4 MG tablet Take 4 mg by mouth 2 (two) times daily.    [provider]  lovastatin (MEVACOR) 20 MG tablet Take 20 mg by mouth at bedtime.    [provider]  metFORMIN (GLUCOPHAGE-XR) 500 MG 24 hr tablet Take 500 mg by mouth daily with supper. Patient not taking: Reported on 01/01/2022 09/08/21   [provider]  spironolactone (ALDACTONE) 25 MG tablet TAKE 1/2 TABLET(12.5 MG) BY MOUTH DAILY Patient taking differently: Take 12.5 mg by mouth daily. TAKE 1/2 TABLET(12.5 MG) BY MOUTH DAILY 10/21/21   Duke Salvia, MD  valsartan (DIOVAN) 80 MG tablet TAKE 1 TABLET(80 MG) BY MOUTH DAILY 10/21/21   Duke Salvia, MD    Physical Exam    Vital Signs:  Russell Fuentes does not have vital signs available for review today.  Given telephonic nature of communication, physical exam is limited. AAOx3. NAD. Normal affect.  Speech and respirations are unlabored.  Accessory Clinical Findings    None  Assessment & Plan     1.  Preoperative Cardiovascular Risk Assessment:  [name] perioperative risk of a major cardiac event is 0.9% according to the Revised Cardiac Risk Index (RCRI).  Therefore, he is at low risk for perioperative complications.   His functional capacity is good at 5.62 METs according to the Duke Activity Status Index (DASI). Recommendations: According to ACC/AHA guidelines, no further cardiovascular testing needed.  The patient may proceed to surgery at acceptable risk.    A copy of this note will be routed to requesting surgeon.  Time:   Today, I have spent 13 minutes with the patient with telehealth technology discussing medical history, symptoms, and management plan.     Sharlene Dory, PA-C  08/13/2022, 8:35 AM

## 2022-08-17 NOTE — Progress Notes (Signed)
Remote ICD transmission.   

## 2022-08-26 ENCOUNTER — Telehealth: Payer: Self-pay | Admitting: Internal Medicine

## 2022-08-26 NOTE — Telephone Encounter (Signed)
Handicap placard pw dropped off. Please get to him asap. He asked for his cell to be the source of reaching ou [phone].   Pw will be in provider box by eod

## 2022-08-27 NOTE — Telephone Encounter (Signed)
[employer] Handicap placard application and pt notified he may pick up at from desk.  Pt verbalizes understanding and thanked Charity fundraiser for the call.

## 2022-09-02 ENCOUNTER — Other Ambulatory Visit: Payer: Self-pay | Admitting: Internal Medicine

## 2022-09-06 DIAGNOSIS — I5022 Chronic systolic (congestive) heart failure: Secondary | ICD-10-CM | POA: Diagnosis not present

## 2022-09-06 DIAGNOSIS — E669 Obesity, unspecified: Secondary | ICD-10-CM | POA: Diagnosis not present

## 2022-09-06 DIAGNOSIS — Z0000 Encounter for general adult medical examination without abnormal findings: Secondary | ICD-10-CM | POA: Diagnosis not present

## 2022-09-06 DIAGNOSIS — I429 Cardiomyopathy, unspecified: Secondary | ICD-10-CM | POA: Diagnosis not present

## 2022-09-06 DIAGNOSIS — I130 Hypertensive heart and chronic kidney disease with heart failure and stage 1 through stage 4 chronic kidney disease, or unspecified chronic kidney disease: Secondary | ICD-10-CM | POA: Diagnosis not present

## 2022-09-06 DIAGNOSIS — E1122 Type 2 diabetes mellitus with diabetic chronic kidney disease: Secondary | ICD-10-CM | POA: Diagnosis not present

## 2022-09-06 DIAGNOSIS — E782 Mixed hyperlipidemia: Secondary | ICD-10-CM | POA: Diagnosis not present

## 2022-09-06 DIAGNOSIS — J309 Allergic rhinitis, unspecified: Secondary | ICD-10-CM | POA: Diagnosis not present

## 2022-09-06 DIAGNOSIS — N183 Chronic kidney disease, stage 3 unspecified: Secondary | ICD-10-CM | POA: Diagnosis not present

## 2022-09-06 LAB — LAB REPORT - SCANNED
A1c: 6.7
EGFR: 55

## 2022-09-13 ENCOUNTER — Ambulatory Visit: Payer: Medicare Other | Attending: Internal Medicine

## 2022-09-13 DIAGNOSIS — Z95810 Presence of automatic (implantable) cardiac defibrillator: Secondary | ICD-10-CM

## 2022-09-13 DIAGNOSIS — I5022 Chronic systolic (congestive) heart failure: Secondary | ICD-10-CM | POA: Diagnosis not present

## 2022-09-15 ENCOUNTER — Telehealth: Payer: Self-pay

## 2022-09-15 DIAGNOSIS — D122 Benign neoplasm of ascending colon: Secondary | ICD-10-CM | POA: Diagnosis not present

## 2022-09-15 DIAGNOSIS — Z800 Family history of malignant neoplasm of digestive organs: Secondary | ICD-10-CM | POA: Diagnosis not present

## 2022-09-15 DIAGNOSIS — D124 Benign neoplasm of descending colon: Secondary | ICD-10-CM | POA: Diagnosis not present

## 2022-09-15 DIAGNOSIS — K5730 Diverticulosis of large intestine without perforation or abscess without bleeding: Secondary | ICD-10-CM | POA: Diagnosis not present

## 2022-09-15 DIAGNOSIS — Z86010 Personal history of colonic polyps: Secondary | ICD-10-CM | POA: Diagnosis not present

## 2022-09-15 DIAGNOSIS — K640 First degree hemorrhoids: Secondary | ICD-10-CM | POA: Diagnosis not present

## 2022-09-15 DIAGNOSIS — Z09 Encounter for follow-up examination after completed treatment for conditions other than malignant neoplasm: Secondary | ICD-10-CM | POA: Diagnosis not present

## 2022-09-15 NOTE — Telephone Encounter (Signed)
Remote ICM transmission received.  Attempted call to patient regarding ICM remote transmission and left detailed message per DPR.  Left ICM phone number and advised to return call for any fluid symptoms or questions. Next ICM remote transmission scheduled 10/18/2022.

## 2022-09-15 NOTE — Progress Notes (Signed)
EPIC Encounter for ICM Monitoring  Patient Name: Russell Fuentes is a 72 y.o. male Date: 09/15/2022 Primary Care Physican: Lupita Raider, MD Primary Cardiologist: Graciela Husbands Electrophysiologist: Joycelyn Schmid Pacing: 95.3%         03/24/2022 Weight: 227 lbs 04/28/2022   Weight: 227 lbs   Time in AT/AF  <0.1 hr/day (<0.1%)                                                  Attempted call to patient and unable to reach.  Left detailed message per DPR regarding transmission. Transmission reviewed.    Diet:  Typically eats restaurant foods 3 times a week.  Discussed limiting salt and fluid intake.    Optivol Thoracic impedance suggesting normal fluid levels.   Prescribed:  Furosemide 20 mg Take 1 tablet (20 mg total) by mouth every other day (3 x week). Spironolactone 25 mg take 0.5 tablet (12.5 mg total) by mouth daily Farxiga 10 mg take 1 tablet daily    Labs: 09/06/2022 Creatinine 1.36, BUN 18, Potassium 4.2, Sodium 140, GFR 55 A complete set of results can be found in Results Review.   Recommendations:  Left voice mail with ICM number and encouraged to call if experiencing any fluid symptoms.   Follow-up plan: ICM clinic phone appointment on 10/18/2022.  91 day device clinic remote transmission 10/05/2022.       EP/Cardiology Office Visits:  09/28/2022 with Dr Graciela Husbands.     Copy of ICM check sent to Dr. Graciela Husbands.   3 month ICM trend: 09/13/2022.    12-14 Month ICM trend:     Karie Soda, RN 09/15/2022 1:05 PM

## 2022-09-17 DIAGNOSIS — D124 Benign neoplasm of descending colon: Secondary | ICD-10-CM | POA: Diagnosis not present

## 2022-09-22 DIAGNOSIS — N5201 Erectile dysfunction due to arterial insufficiency: Secondary | ICD-10-CM | POA: Diagnosis not present

## 2022-09-22 DIAGNOSIS — R351 Nocturia: Secondary | ICD-10-CM | POA: Diagnosis not present

## 2022-09-22 DIAGNOSIS — N401 Enlarged prostate with lower urinary tract symptoms: Secondary | ICD-10-CM | POA: Diagnosis not present

## 2022-09-22 DIAGNOSIS — R972 Elevated prostate specific antigen [PSA]: Secondary | ICD-10-CM | POA: Diagnosis not present

## 2022-09-28 ENCOUNTER — Ambulatory Visit: Payer: Medicare Other | Attending: Internal Medicine | Admitting: Internal Medicine

## 2022-09-28 ENCOUNTER — Other Ambulatory Visit: Payer: Self-pay

## 2022-09-28 ENCOUNTER — Encounter: Payer: Self-pay | Admitting: Internal Medicine

## 2022-09-28 VITALS — BP 100/64 | HR 75 | Ht 70.0 in | Wt 222.0 lb

## 2022-09-28 DIAGNOSIS — Z95810 Presence of automatic (implantable) cardiac defibrillator: Secondary | ICD-10-CM | POA: Insufficient documentation

## 2022-09-28 DIAGNOSIS — I428 Other cardiomyopathies: Secondary | ICD-10-CM | POA: Insufficient documentation

## 2022-09-28 DIAGNOSIS — I5032 Chronic diastolic (congestive) heart failure: Secondary | ICD-10-CM | POA: Insufficient documentation

## 2022-09-28 NOTE — Patient Instructions (Signed)
Medication Instructions:  Your physician recommends that you continue on your current medications as directed. Please refer to the Current Medication list given to you today.  *If you need a refill on your cardiac medications before your next appointment, please call your pharmacy*   Lab Work: None ordered.  If you have labs (blood work) drawn today and your tests are completely normal, you will receive your results only by: MyChart Message (if you have MyChart) OR A paper copy in the mail If you have any lab test that is abnormal or we need to change your treatment, we will call you to review the results.   Testing/Procedures: None ordered.    Follow-Up: At [facility], you and your health needs are our priority.  As part of our continuing mission to provide you with exceptional heart care, we have created designated Provider Care Teams.  These Care Teams include your primary Cardiologist (physician) and Advanced Practice Providers (APPs -  Physician Assistants and Nurse Practitioners) who all work together to provide you with the care you need, when you need it.  We recommend signing up for the patient portal called "MyChart".  Sign up information is provided on this After Visit Summary.  MyChart is used to connect with patients for Virtual Visits (Telemedicine).  Patients are able to view lab/test results, encounter notes, upcoming appointments, etc.  Non-urgent messages can be sent to your provider as well.   To learn more about what you can do with MyChart, go to [email or web address].    Your next appointment:   12 months with Dr Klein 

## 2022-09-28 NOTE — Progress Notes (Signed)
Patient ID: Russell Fuentes, male   DOB: 1950-09-24, 73 y.o.   MRN: [id number]      Patient Care Team: Lupita Raider, MD as PCP - General (Family Medicine) Duke Salvia, MD as PCP - Electrophysiology (Cardiology)   HPI  Russell Fuentes is a 72 y.o. male Seen in followup for an ICD  CRT Medtronic implanted for nonischemic cardiomyopathy;  generator replacement 2016, 2023   The patient denies chest pain,, nocturnal dyspnea, orthopnea or peripheral edema.  There have been no palpitations, lightheadedness or syncope.  Complains of mild chronic shortness of breath.   Tolerating Marcelline Deist  Granddaughter died of a brain tumor 2022/09/29 ([name])  Date Cr K Hgb  6/19 1.17 4.3 15.5  2/20 1.33 4.3    7/21 1.25 4.2 13.9  2/23 1.31 4.2   09-29-22 1.27 4.5 15.2   DATE TEST EF   11/14 Echo   55-65 %   9/15 LHC  No obstructive CAD  5/18 Echo   35 %   5/18 Echo  45  AV/VV optimization  8/19 Echo  35-40%   7/21 Echo  40-45%   2/23 Echo  35-40% LA mod    Past Medical History:  Diagnosis Date   CHF (congestive heart failure) (HCC)    Chronic combined systolic and diastolic heart failure (HCC)     Diabetes mellitus with neuropathy (HCC)     Hypertension    Nonischemic cardiomyopathy (HCC)     a. s/p MDT CRTD   Orthostatic lightheadedness      Past Surgical History:  Procedure Laterality Date   BIV ICD GENERATOR CHANGEOUT N/A 09/28/2021   Procedure: BIV ICD GENERATOR CHANGEOUT;  Surgeon: Duke Salvia, MD;  Location: [facility] INVASIVE CV LAB;  Service: Cardiovascular;  Laterality: N/A;   CARDIAC CATHETERIZATION  12/31/1998   Bi-plane cine left ventriculography revealed low-normal left ventricular function with an ejection fraction in the 45% to 50% range.   CORONARY ARTERY BYPASS GRAFT     DOPPLER ECHOCARDIOGRAPHY  04/28/2011   LVEF 41% by simpson's borderline concentric LVH, global hypokinesis with regional variation. Stage 1 diastolic dysfunction, normal LV filling pressure. Normal LA size.  Right heart pacing wires noted. Trace MR, TR. Normal RVSP   EP IMPLANTABLE DEVICE N/A 09/16/2014   Procedure: ICD Generator Changeout;  Surgeon: Duke Salvia, MD;  Location: [facility] INVASIVE CV LAB;  Service: Cardiovascular;  Laterality: N/A;   LEFT HEART CATHETERIZATION WITH CORONARY ANGIOGRAM N/A 12/11/2013   Procedure: LEFT HEART CATHETERIZATION WITH CORONARY ANGIOGRAM;  Surgeon: Lesleigh Noe, MD;  Location: [facility] CATH LAB;  Service: Cardiovascular;  Laterality: N/A;   NM MYOCAR PERF EJECTION FRACTION  04/03/2009, 11/18/2005   The post stress myocardial perfusion images show a normal pattern of perfusion in all regions. The post-stress ejection fraction is 50%. No significant wall motion abnormalities noted. This is a low risk scan.    Current Outpatient Medications  Medication Sig Dispense Refill   carvedilol (COREG) 12.5 MG tablet TAKE 1 TABLET(12.5 MG) BY MOUTH TWICE DAILY 180 tablet 3   dapagliflozin propanediol (FARXIGA) 10 MG TABS tablet Take 10 mg by mouth daily.     furosemide (LASIX) 20 MG tablet Every other day (3x per week) 63 tablet 3   glimepiride (AMARYL) 4 MG tablet Take 4 mg by mouth 2 (two) times daily.     lovastatin (MEVACOR) 20 MG tablet Take 20 mg by mouth at bedtime.     spironolactone (ALDACTONE) 25 MG tablet TAKE  1/2 TABLET(12.5 MG) BY MOUTH DAILY (Patient taking differently: Take 12.5 mg by mouth daily. TAKE 1/2 TABLET(12.5 MG) BY MOUTH DAILY) 45 tablet 3   valsartan (DIOVAN) 80 MG tablet TAKE 1 TABLET(80 MG) BY MOUTH DAILY 90 tablet 3   No current facility-administered medications for this visit.    Allergies  Allergen Reactions   Entresto [Sacubitril-Valsartan]     Fatigue, joint pain. Ok on valsartan alone   Metformin Hcl Other (See Comments)   Codeine Rash    Review of Systems negative except from HPI and PMH  Physical Exam: BP 100/64   Pulse 75   Ht 5\' 10"  (1.778 m)   Wt 222 lb (100.7 kg)   SpO2 96%   BMI 31.85 kg/m  Well developed and well  nourished in no acute distress HENT normal Neck supple with JVP-flat Clear Device pocket well healed; without hematoma or erythema.  There is no tethering  Regular rate and rhythm, no  gallop No murmur Abd-soft with active BS No Clubbing cyanosis tr edema Skin-warm and dry A & Oriented  Grossly normal sensory and motor function  ECG P synchronous pacing with negative QRS lead I and a RS lead V1  Device function is normal. Programming changes none See Paceart for details     EKG sinus with P synchronous pacing.  Initial ECG showed QRS morphology that was negative in lead V I and positive in lead I.  Reviewing tracings back to 9/21 they were upright in lead V1 and negative in lead I  We will continue up again it was biphasic in lead V1 and negative in lead I.  We increased the LV offset from 0---20 ms with improvement in the operating QRS morphology lead V1   Assessment and  Plan  Nonischemic cardiomyopathy-   Congestive heart failure- chronic-diastolic  Diabetes  Implantable defibrillator-Medtronic- CRT device    High Risk Medication Surveillance Aldactone      Continue guideline directed therapy for his nonischemic myopathy including Aldactone Diovan and carvedilol and Farxiga did not tolerate Entresto  Euvolemic.  Continue furosemide

## 2022-10-05 ENCOUNTER — Ambulatory Visit (INDEPENDENT_AMBULATORY_CARE_PROVIDER_SITE_OTHER): Payer: Medicare Other

## 2022-10-05 DIAGNOSIS — I428 Other cardiomyopathies: Secondary | ICD-10-CM

## 2022-10-05 LAB — CUP PACEART REMOTE DEVICE CHECK
Battery Remaining Longevity: 71 mo
Battery Voltage: 2.99 V
Brady Statistic AP VP Percent: 53.95 %
Brady Statistic AP VS Percent: 0.85 %
Brady Statistic AS VP Percent: 43.89 %
Brady Statistic AS VS Percent: 1.31 %
Brady Statistic RA Percent Paced: 54.4 %
Brady Statistic RV Percent Paced: 6.77 %
Date Time Interrogation Session: 20240702023324
HighPow Impedance: 59 Ohm
HighPow Impedance: 69 Ohm
Implantable Lead Connection Status: 753985
Implantable Lead Connection Status: 753985
Implantable Lead Connection Status: 753985
Implantable Lead Implant Date: 20060323
Implantable Lead Implant Date: 20060323
Implantable Lead Implant Date: 20110420
Implantable Lead Location: 753858
Implantable Lead Location: 753859
Implantable Lead Location: 753860
Implantable Lead Model: 4193
Implantable Lead Model: 5076
Implantable Lead Model: 7121
Implantable Pulse Generator Implant Date: 20230626
Lead Channel Impedance Value: 399 Ohm
Lead Channel Impedance Value: 399 Ohm
Lead Channel Impedance Value: 4047 Ohm
Lead Channel Impedance Value: 4047 Ohm
Lead Channel Impedance Value: 513 Ohm
Lead Channel Impedance Value: 551 Ohm
Lead Channel Pacing Threshold Amplitude: 0.75 V
Lead Channel Pacing Threshold Amplitude: 1 V
Lead Channel Pacing Threshold Amplitude: 1.125 V
Lead Channel Pacing Threshold Pulse Width: 0.4 ms
Lead Channel Pacing Threshold Pulse Width: 0.4 ms
Lead Channel Pacing Threshold Pulse Width: 1 ms
Lead Channel Sensing Intrinsic Amplitude: 18.625 mV
Lead Channel Sensing Intrinsic Amplitude: 18.625 mV
Lead Channel Sensing Intrinsic Amplitude: 3 mV
Lead Channel Sensing Intrinsic Amplitude: 3 mV
Lead Channel Setting Pacing Amplitude: 1.5 V
Lead Channel Setting Pacing Amplitude: 2 V
Lead Channel Setting Pacing Amplitude: 2.5 V
Lead Channel Setting Pacing Pulse Width: 0.4 ms
Lead Channel Setting Pacing Pulse Width: 1 ms
Lead Channel Setting Sensing Sensitivity: 0.6 mV
Zone Setting Status: 755011

## 2022-10-11 DIAGNOSIS — H6502 Acute serous otitis media, left ear: Secondary | ICD-10-CM | POA: Diagnosis not present

## 2022-10-11 DIAGNOSIS — J0110 Acute frontal sinusitis, unspecified: Secondary | ICD-10-CM | POA: Diagnosis not present

## 2022-10-11 DIAGNOSIS — S80861A Insect bite (nonvenomous), right lower leg, initial encounter: Secondary | ICD-10-CM | POA: Diagnosis not present

## 2022-10-11 DIAGNOSIS — H6121 Impacted cerumen, right ear: Secondary | ICD-10-CM | POA: Diagnosis not present

## 2022-10-18 ENCOUNTER — Other Ambulatory Visit: Payer: Self-pay | Admitting: Internal Medicine

## 2022-10-18 ENCOUNTER — Ambulatory Visit: Payer: Medicare Other | Attending: Internal Medicine

## 2022-10-18 DIAGNOSIS — Z95810 Presence of automatic (implantable) cardiac defibrillator: Secondary | ICD-10-CM

## 2022-10-18 DIAGNOSIS — I5032 Chronic diastolic (congestive) heart failure: Secondary | ICD-10-CM | POA: Diagnosis not present

## 2022-10-24 NOTE — Progress Notes (Signed)
EPIC Encounter for ICM Monitoring  Patient Name: Russell Fuentes is a 72 y.o. male Date: 10/24/2022 Primary Care Physican: Lupita Raider, MD Primary Cardiologist: Graciela Husbands Electrophysiologist: Joycelyn Schmid Pacing: 96.5%         03/24/2022 Weight: 227 lbs 04/28/2022   Weight: 227 lbs   Time in AT/AF  <0.1 hr/day (<0.1%)                                                  Transmission reviewed.    Diet:  Typically eats restaurant foods 3 times a week.    Optivol Thoracic impedance suggesting normal fluid levels with the exception of possible fluid accumulation starting 7/9 and returning to baseline 7/16.   Prescribed:  Furosemide 20 mg Take 1 tablet (20 mg total) by mouth every other day (3 x week). Spironolactone 25 mg take 0.5 tablet (12.5 mg total) by mouth daily Farxiga 10 mg take 1 tablet daily    Labs: 09/06/2022 Creatinine 1.36, BUN 18, Potassium 4.2, Sodium 140, GFR 55 A complete set of results can be found in Results Review.   Recommendations: No changes.   Follow-up plan: ICM clinic phone appointment on 11/22/2022.  91 day device clinic remote transmission 01/04/2023.       EP/Cardiology Office Visits:  Recall 09/23/2023 with Dr Graciela Husbands.     Copy of ICM check sent to Dr. Graciela Husbands.   3 month ICM trend: 10/19/2022.    12-14 Month ICM trend:     Karie Soda, RN 10/24/2022 11:35 AM

## 2022-10-29 NOTE — Progress Notes (Signed)
Remote ICD transmission.   

## 2022-11-04 ENCOUNTER — Other Ambulatory Visit: Payer: Self-pay | Admitting: Internal Medicine

## 2022-11-16 DIAGNOSIS — S61452A Open bite of left hand, initial encounter: Secondary | ICD-10-CM | POA: Diagnosis not present

## 2022-11-16 DIAGNOSIS — M79642 Pain in left hand: Secondary | ICD-10-CM | POA: Diagnosis not present

## 2022-11-22 ENCOUNTER — Ambulatory Visit: Payer: Medicare Other | Attending: Internal Medicine

## 2022-11-22 DIAGNOSIS — I5032 Chronic diastolic (congestive) heart failure: Secondary | ICD-10-CM

## 2022-11-22 DIAGNOSIS — Z95810 Presence of automatic (implantable) cardiac defibrillator: Secondary | ICD-10-CM

## 2022-11-25 ENCOUNTER — Telehealth: Payer: Self-pay

## 2022-11-25 NOTE — Telephone Encounter (Signed)
Remote ICM transmission received.  Attempted call to patient regarding ICM remote transmission and left detailed message.  Left ICM phone number and advised to return call for any fluid symptoms or questions. Next ICM remote transmission scheduled 12/27/2022.

## 2022-11-25 NOTE — Progress Notes (Signed)
EPIC Encounter for ICM Monitoring  Patient Name: Russell Fuentes is a 72 y.o. male Date: 11/25/2022 Primary Care Physican: Lupita Raider, MD Primary Cardiologist: Graciela Husbands Electrophysiologist: Joycelyn Schmid Pacing: 94.4%         03/24/2022 Weight: 227 lbs 04/28/2022   Weight: 227 lbs   Time in AT/AF  <0.1 hr/day (<0.1%)                                                  Attempted call to patient and unable to reach.  Left detailed message per DPR regarding transmission. Transmission reviewed.    Diet:  Typically eats restaurant foods 3 times a week.    Optivol Thoracic impedance suggesting normal fluid levels with the exception of possible fluid accumulation starting 7/17-7/29 and 8/4-8/9.   Prescribed:  Furosemide 20 mg Take 1 tablet (20 mg total) by mouth every other day (3 x week). Spironolactone 25 mg take 0.5 tablet (12.5 mg total) by mouth daily Farxiga 10 mg take 1 tablet daily    Labs: 09/06/2022 Creatinine 1.36, BUN 18, Potassium 4.2, Sodium 140, GFR 55 A complete set of results can be found in Results Review.   Recommendations:   Left voice mail with ICM number and encouraged to call if experiencing any fluid symptoms.   Follow-up plan: ICM clinic phone appointment on 12/27/2022.  91 day device clinic remote transmission 01/04/2023.       EP/Cardiology Office Visits:  Recall 09/23/2023 with Dr Graciela Husbands.     Copy of ICM check sent to Dr. Graciela Husbands.   3 month ICM trend: 11/22/2022.    12-14 Month ICM trend:     Karie Soda, RN 11/25/2022 10:13 AM

## 2022-12-20 DIAGNOSIS — M47816 Spondylosis without myelopathy or radiculopathy, lumbar region: Secondary | ICD-10-CM | POA: Diagnosis not present

## 2022-12-27 ENCOUNTER — Ambulatory Visit: Payer: Medicare Other | Attending: Internal Medicine

## 2022-12-27 DIAGNOSIS — Z95810 Presence of automatic (implantable) cardiac defibrillator: Secondary | ICD-10-CM

## 2022-12-27 DIAGNOSIS — I5032 Chronic diastolic (congestive) heart failure: Secondary | ICD-10-CM

## 2022-12-30 ENCOUNTER — Telehealth: Payer: Self-pay

## 2022-12-30 DIAGNOSIS — Z23 Encounter for immunization: Secondary | ICD-10-CM | POA: Diagnosis not present

## 2022-12-30 NOTE — Telephone Encounter (Signed)
Remote ICM transmission received.  Attempted call to patient regarding ICM remote transmission and left detailed message per DPR.  Left ICM phone number and advised to return call for any fluid symptoms or questions. Next ICM remote transmission scheduled 01/31/2023.

## 2022-12-30 NOTE — Progress Notes (Signed)
EPIC Encounter for ICM Monitoring  Patient Name: Russell Fuentes is a 72 y.o. male Date: 12/30/2022 Primary Care Physican: Lupita Raider, MD Primary Cardiologist: Graciela Husbands Electrophysiologist: Joycelyn Schmid Pacing: 93.7%         03/24/2022 Weight: 227 lbs 04/28/2022   Weight: 227 lbs   Time in AT/AF  <0.1 hr/day (<0.1%)                                                  Attempted call to patient and unable to reach.  Left detailed message per DPR regarding transmission. Transmission reviewed.    Diet:  Typically eats restaurant foods 3 times a week.    Optivol Thoracic impedance suggesting normal fluid levels with the exception of possible fluid accumulation starting 8/12-8/26 and 9/2-9/15.   Prescribed:  Furosemide 20 mg Take 1 tablet (20 mg total) by mouth every other day (3 x week). Spironolactone 25 mg take 0.5 tablet (12.5 mg total) by mouth daily Farxiga 10 mg take 1 tablet daily    Labs: 09/06/2022 Creatinine 1.36, BUN 18, Potassium 4.2, Sodium 140, GFR 55 A complete set of results can be found in Results Review.   Recommendations:   Left voice mail with ICM number and encouraged to call if experiencing any fluid symptoms.   Follow-up plan: ICM clinic phone appointment on 01/31/2023.  91 day device clinic remote transmission 01/04/2023.       EP/Cardiology Office Visits:  Recall 09/23/2023 with Dr Graciela Husbands.     Copy of ICM check sent to Dr. Graciela Husbands.   3 month ICM trend: 12/27/2022.    12-14 Month ICM trend:     Karie Soda, RN 12/30/2022 12:53 PM

## 2023-01-04 ENCOUNTER — Ambulatory Visit: Payer: Medicare Other

## 2023-01-04 DIAGNOSIS — I5022 Chronic systolic (congestive) heart failure: Secondary | ICD-10-CM

## 2023-01-04 DIAGNOSIS — I428 Other cardiomyopathies: Secondary | ICD-10-CM

## 2023-01-05 LAB — CUP PACEART REMOTE DEVICE CHECK
Battery Remaining Longevity: 66 mo
Battery Voltage: 2.99 V
Brady Statistic AP VP Percent: 47.56 %
Brady Statistic AP VS Percent: 0.74 %
Brady Statistic AS VP Percent: 50.85 %
Brady Statistic AS VS Percent: 0.84 %
Brady Statistic RA Percent Paced: 47.94 %
Brady Statistic RV Percent Paced: 3.8 %
Date Time Interrogation Session: 20241001043723
HighPow Impedance: 50 Ohm
HighPow Impedance: 65 Ohm
Implantable Lead Connection Status: 753985
Implantable Lead Connection Status: 753985
Implantable Lead Connection Status: 753985
Implantable Lead Implant Date: 20060323
Implantable Lead Implant Date: 20060323
Implantable Lead Implant Date: 20110420
Implantable Lead Location: 753858
Implantable Lead Location: 753859
Implantable Lead Location: 753860
Implantable Lead Model: 4193
Implantable Lead Model: 5076
Implantable Lead Model: 7121
Implantable Pulse Generator Implant Date: 20230626
Lead Channel Impedance Value: 304 Ohm
Lead Channel Impedance Value: 361 Ohm
Lead Channel Impedance Value: 399 Ohm
Lead Channel Impedance Value: 4047 Ohm
Lead Channel Impedance Value: 4047 Ohm
Lead Channel Impedance Value: 513 Ohm
Lead Channel Pacing Threshold Amplitude: 0.75 V
Lead Channel Pacing Threshold Amplitude: 1.125 V
Lead Channel Pacing Threshold Amplitude: 2.5 V
Lead Channel Pacing Threshold Pulse Width: 0.4 ms
Lead Channel Pacing Threshold Pulse Width: 0.4 ms
Lead Channel Pacing Threshold Pulse Width: 1 ms
Lead Channel Sensing Intrinsic Amplitude: 18.75 mV
Lead Channel Sensing Intrinsic Amplitude: 18.75 mV
Lead Channel Sensing Intrinsic Amplitude: 3.25 mV
Lead Channel Sensing Intrinsic Amplitude: 3.25 mV
Lead Channel Setting Pacing Amplitude: 1.5 V
Lead Channel Setting Pacing Amplitude: 2 V
Lead Channel Setting Pacing Amplitude: 2.5 V
Lead Channel Setting Pacing Pulse Width: 0.4 ms
Lead Channel Setting Pacing Pulse Width: 1 ms
Lead Channel Setting Sensing Sensitivity: 0.6 mV
Zone Setting Status: 755011

## 2023-01-18 DIAGNOSIS — R972 Elevated prostate specific antigen [PSA]: Secondary | ICD-10-CM | POA: Diagnosis not present

## 2023-01-20 NOTE — Progress Notes (Signed)
Remote ICD transmission.   

## 2023-01-21 DIAGNOSIS — R0981 Nasal congestion: Secondary | ICD-10-CM | POA: Diagnosis not present

## 2023-01-21 DIAGNOSIS — J019 Acute sinusitis, unspecified: Secondary | ICD-10-CM | POA: Diagnosis not present

## 2023-01-21 DIAGNOSIS — R051 Acute cough: Secondary | ICD-10-CM | POA: Diagnosis not present

## 2023-01-25 DIAGNOSIS — R972 Elevated prostate specific antigen [PSA]: Secondary | ICD-10-CM | POA: Diagnosis not present

## 2023-01-25 DIAGNOSIS — N400 Enlarged prostate without lower urinary tract symptoms: Secondary | ICD-10-CM | POA: Diagnosis not present

## 2023-01-31 ENCOUNTER — Ambulatory Visit: Payer: Medicare Other | Attending: Internal Medicine

## 2023-01-31 DIAGNOSIS — I5022 Chronic systolic (congestive) heart failure: Secondary | ICD-10-CM

## 2023-01-31 DIAGNOSIS — Z95810 Presence of automatic (implantable) cardiac defibrillator: Secondary | ICD-10-CM

## 2023-02-04 NOTE — Progress Notes (Signed)
EPIC Encounter for ICM Monitoring  Patient Name: Russell Fuentes is a 72 y.o. male Date: 02/04/2023 Primary Care Physican: Lupita Raider, MD Primary Cardiologist: Graciela Husbands Electrophysiologist: Joycelyn Schmid Pacing: 95.7%         03/24/2022 Weight: 227 lbs 04/28/2022   Weight: 227 lbs 02/04/2023 Weight: 227 lbs   Time in AT/AF  <0.1 hr/day (<0.1%)                                                  Spoke with patient and heart failure questions reviewed.  Transmission results reviewed.  Pt asymptomatic for fluid accumulation.  Reports feeling well at this time and voices no complaints.     Diet:  Typically eats restaurant foods 3 times a week.    Optivol Thoracic impedance suggesting normal fluid levels with the exception of possible fluid accumulation starting 10/6-10/12.   Prescribed:  Furosemide 20 mg Take 1 tablet (20 mg total) by mouth every other day (3 x week). Spironolactone 25 mg take 0.5 tablet (12.5 mg total) by mouth daily Farxiga 10 mg take 1 tablet daily    Labs: 09/06/2022 Creatinine 1.36, BUN 18, Potassium 4.2, Sodium 140, GFR 55 A complete set of results can be found in Results Review.   Recommendations: No changes and encouraged to call if experiencing any fluid symptoms.   Follow-up plan: ICM clinic phone appointment on 03/07/2023.  91 day device clinic remote transmission 04/05/2023.       EP/Cardiology Office Visits:  Recall 09/23/2023 with Dr Graciela Husbands.     Copy of ICM check sent to Dr. Graciela Husbands.   3 month ICM trend: 01/31/2023.    12-14 Month ICM trend:     Karie Soda, RN 02/04/2023 2:20 PM

## 2023-02-22 DIAGNOSIS — J0100 Acute maxillary sinusitis, unspecified: Secondary | ICD-10-CM | POA: Diagnosis not present

## 2023-03-07 ENCOUNTER — Ambulatory Visit: Payer: Medicare Other | Attending: Internal Medicine

## 2023-03-07 DIAGNOSIS — Z95810 Presence of automatic (implantable) cardiac defibrillator: Secondary | ICD-10-CM | POA: Diagnosis not present

## 2023-03-07 DIAGNOSIS — I5022 Chronic systolic (congestive) heart failure: Secondary | ICD-10-CM | POA: Diagnosis not present

## 2023-03-15 DIAGNOSIS — I130 Hypertensive heart and chronic kidney disease with heart failure and stage 1 through stage 4 chronic kidney disease, or unspecified chronic kidney disease: Secondary | ICD-10-CM | POA: Diagnosis not present

## 2023-03-15 DIAGNOSIS — N183 Chronic kidney disease, stage 3 unspecified: Secondary | ICD-10-CM | POA: Diagnosis not present

## 2023-03-15 DIAGNOSIS — I5022 Chronic systolic (congestive) heart failure: Secondary | ICD-10-CM | POA: Diagnosis not present

## 2023-03-15 DIAGNOSIS — E1122 Type 2 diabetes mellitus with diabetic chronic kidney disease: Secondary | ICD-10-CM | POA: Diagnosis not present

## 2023-03-15 DIAGNOSIS — E782 Mixed hyperlipidemia: Secondary | ICD-10-CM | POA: Diagnosis not present

## 2023-03-15 NOTE — Progress Notes (Signed)
EPIC Encounter for ICM Monitoring  Patient Name: Russell Fuentes is a 72 y.o. male Date: 03/15/2023 Primary Care Physican: Lupita Raider, MD Primary Cardiologist: Graciela Husbands Electrophysiologist: Joycelyn Schmid Pacing: 95.7%         03/24/2022 Weight: 227 lbs 04/28/2022   Weight: 227 lbs 02/04/2023 Weight: 227 lbs   Time in AT/AF  <0.1 hr/day (<0.1%)                                                  Spoke with patient and heart failure questions reviewed.  Transmission results reviewed.  Pt asymptomatic for fluid accumulation.  Reports feeling well at this time and voices no complaints.  He missed a couple of Lasix dosages during decreased impedance.    Diet:  Typically eats restaurant foods 3 times a week.    Optivol Thoracic impedance suggesting normal fluid levels with the exception of 11/26 and back to baseline 12/2.   Prescribed:  Furosemide 20 mg Take 1 tablet (20 mg total) by mouth every other day (3 x week). Spironolactone 25 mg take 0.5 tablet (12.5 mg total) by mouth daily Farxiga 10 mg take 1 tablet daily    Labs: 09/06/2022 Creatinine 1.36, BUN 18, Potassium 4.2, Sodium 140, GFR 55 A complete set of results can be found in Results Review.   Recommendations: No changes and encouraged to call if experiencing any fluid symptoms.   Follow-up plan: ICM clinic phone appointment on 04/11/2023.  91 day device clinic remote transmission 04/05/2023.       EP/Cardiology Office Visits:  Recall 09/23/2023 with Dr Graciela Husbands.     Copy of ICM check sent to Dr. Graciela Husbands.   3 month ICM trend: 03/07/2023.    12-14 Month ICM trend:     Karie Soda, RN 03/15/2023 2:21 PM

## 2023-03-24 ENCOUNTER — Other Ambulatory Visit: Payer: Self-pay | Admitting: Internal Medicine

## 2023-03-24 DIAGNOSIS — I428 Other cardiomyopathies: Secondary | ICD-10-CM

## 2023-03-28 DIAGNOSIS — H8110 Benign paroxysmal vertigo, unspecified ear: Secondary | ICD-10-CM | POA: Diagnosis not present

## 2023-04-05 ENCOUNTER — Ambulatory Visit (INDEPENDENT_AMBULATORY_CARE_PROVIDER_SITE_OTHER): Payer: Medicare Other

## 2023-04-05 DIAGNOSIS — I5022 Chronic systolic (congestive) heart failure: Secondary | ICD-10-CM

## 2023-04-05 DIAGNOSIS — I428 Other cardiomyopathies: Secondary | ICD-10-CM

## 2023-04-05 LAB — CUP PACEART REMOTE DEVICE CHECK
Battery Remaining Longevity: 63 mo
Battery Voltage: 2.98 V
Brady Statistic AP VP Percent: 53.67 %
Brady Statistic AP VS Percent: 0.85 %
Brady Statistic AS VP Percent: 44.26 %
Brady Statistic AS VS Percent: 1.22 %
Brady Statistic RA Percent Paced: 53.89 %
Brady Statistic RV Percent Paced: 4.89 %
Date Time Interrogation Session: 20241231063324
HighPow Impedance: 57 Ohm
HighPow Impedance: 72 Ohm
Implantable Lead Connection Status: 753985
Implantable Lead Connection Status: 753985
Implantable Lead Connection Status: 753985
Implantable Lead Implant Date: 20060323
Implantable Lead Implant Date: 20060323
Implantable Lead Implant Date: 20110420
Implantable Lead Location: 753858
Implantable Lead Location: 753859
Implantable Lead Location: 753860
Implantable Lead Model: 4193
Implantable Lead Model: 5076
Implantable Lead Model: 7121
Implantable Pulse Generator Implant Date: 20230626
Lead Channel Impedance Value: 342 Ohm
Lead Channel Impedance Value: 361 Ohm
Lead Channel Impedance Value: 4047 Ohm
Lead Channel Impedance Value: 4047 Ohm
Lead Channel Impedance Value: 418 Ohm
Lead Channel Impedance Value: 532 Ohm
Lead Channel Pacing Threshold Amplitude: 0.75 V
Lead Channel Pacing Threshold Amplitude: 1 V
Lead Channel Pacing Threshold Amplitude: 1.25 V
Lead Channel Pacing Threshold Pulse Width: 0.4 ms
Lead Channel Pacing Threshold Pulse Width: 0.4 ms
Lead Channel Pacing Threshold Pulse Width: 1 ms
Lead Channel Sensing Intrinsic Amplitude: 16.875 mV
Lead Channel Sensing Intrinsic Amplitude: 16.875 mV
Lead Channel Sensing Intrinsic Amplitude: 3.625 mV
Lead Channel Sensing Intrinsic Amplitude: 3.625 mV
Lead Channel Setting Pacing Amplitude: 1.5 V
Lead Channel Setting Pacing Amplitude: 2 V
Lead Channel Setting Pacing Amplitude: 2.5 V
Lead Channel Setting Pacing Pulse Width: 0.4 ms
Lead Channel Setting Pacing Pulse Width: 1 ms
Lead Channel Setting Sensing Sensitivity: 0.6 mV
Zone Setting Status: 755011

## 2023-04-11 ENCOUNTER — Ambulatory Visit: Payer: Medicare Other | Attending: Internal Medicine

## 2023-04-11 DIAGNOSIS — Z95810 Presence of automatic (implantable) cardiac defibrillator: Secondary | ICD-10-CM | POA: Diagnosis not present

## 2023-04-11 DIAGNOSIS — I5022 Chronic systolic (congestive) heart failure: Secondary | ICD-10-CM

## 2023-04-15 NOTE — Progress Notes (Signed)
 EPIC Encounter for ICM Monitoring  Patient Name: Russell Fuentes is a 73 y.o. male Date: 04/15/2023 Primary Care Physican: Loreli Kins, MD Primary Cardiologist: Fernande Electrophysiologist: Fernande Pore Pacing: 97.1%         03/24/2022 Weight: 227 lbs 04/28/2022   Weight: 227 lbs 02/04/2023 Weight: 227 lbs   Time in AT/AF  0.0 hr/day (0.0%)                                                  Spoke with patient and heart failure questions reviewed.  Transmission results reviewed.  Pt asymptomatic for fluid accumulation.  He had vertigo about 4 weeks ago and was told he had some fluid accumulation.    Diet:  Typically eats restaurant foods 3 times a week.    Optivol Thoracic impedance suggesting intermittent days with possible fluid accumulation within the last month.   Prescribed:  Furosemide  20 mg Take 1 tablet (20 mg total) by mouth every other day (takes 3 x week). Spironolactone  25 mg take 0.5 tablet (12.5 mg total) by mouth daily Farxiga 10 mg take 1 tablet daily    Labs: 09/06/2022 Creatinine 1.36, BUN 18, Potassium 4.2, Sodium 140, GFR 55 A complete set of results can be found in Results Review.   Recommendations:  No changes and encouraged to call if experiencing any fluid symptoms.   Follow-up plan: ICM clinic phone appointment on 05/16/2023.  91 day device clinic remote transmission 07/05/2023.       EP/Cardiology Office Visits:  Recall 09/23/2023 with Dr Fernande.     Copy of ICM check sent to Dr. Fernande.   3 month ICM trend: 04/11/2023.    12-14 Month ICM trend:     [name], RN 04/15/2023 9:18 AM

## 2023-05-16 ENCOUNTER — Ambulatory Visit: Payer: Medicare Other | Attending: Internal Medicine

## 2023-05-16 DIAGNOSIS — Z95810 Presence of automatic (implantable) cardiac defibrillator: Secondary | ICD-10-CM | POA: Diagnosis not present

## 2023-05-16 DIAGNOSIS — I5022 Chronic systolic (congestive) heart failure: Secondary | ICD-10-CM

## 2023-05-17 DIAGNOSIS — R42 Dizziness and giddiness: Secondary | ICD-10-CM | POA: Diagnosis not present

## 2023-05-17 NOTE — Progress Notes (Signed)
Remote ICD transmission.

## 2023-05-17 NOTE — Progress Notes (Unsigned)
EPIC Encounter for ICM Monitoring  Patient Name: Russell Fuentes is a 73 y.o. male Date: 05/17/2023 Primary Care Physican: Lupita Raider, MD Primary Cardiologist: Graciela Husbands Electrophysiologist: Joycelyn Schmid Pacing: 95.1%         03/24/2022 Weight: 227 lbs 04/28/2022   Weight: 227 lbs 02/04/2023 Weight: 227 lbs   Time in AT/AF  0.0 hr/day (0.0%)                                                  Attempted call to patient and unable to reach.  Left detailed message per DPR regarding transmission.  Transmission results reviewed.    Diet:  Typically eats restaurant foods 3 times a week.    Optivol Thoracic impedance suggesting possible fluid accumulation startimg 1/25   Prescribed:  Furosemide 20 mg Take 1 tablet (20 mg total) by mouth every other day (takes 3 x week). Spironolactone 25 mg take 0.5 tablet (12.5 mg total) by mouth daily Farxiga 10 mg take 1 tablet daily    Labs: 09/06/2022 Creatinine 1.36, BUN 18, Potassium 4.2, Sodium 140, GFR 55 A complete set of results can be found in Results Review.   Recommendations:   Left voice mail with ICM number and encouraged to call if experiencing any fluid symptoms.   Follow-up plan: ICM clinic phone appointment on 05/24/2023 to recheck fluid levels.  91 day device clinic remote transmission 07/05/2023.       EP/Cardiology Office Visits:  Recall 09/23/2023 with Dr Graciela Husbands.     Copy of ICM check sent to Dr. Graciela Husbands.   3 month ICM trend: 05/16/2023.    12-14 Month ICM trend:     Karie Soda, RN 05/17/2023 10:38 AM

## 2023-05-18 ENCOUNTER — Telehealth: Payer: Self-pay

## 2023-05-18 NOTE — Telephone Encounter (Signed)
Remote ICM transmission received.  Attempted call to patient regarding ICM remote transmission and left detailed message per DPR.  Left ICM phone number and advised to return call for any fluid symptoms or questions. Next ICM remote transmission scheduled 05/24/2023.

## 2023-05-23 DIAGNOSIS — E119 Type 2 diabetes mellitus without complications: Secondary | ICD-10-CM | POA: Diagnosis not present

## 2023-05-23 DIAGNOSIS — Z961 Presence of intraocular lens: Secondary | ICD-10-CM | POA: Diagnosis not present

## 2023-05-24 ENCOUNTER — Ambulatory Visit: Payer: Medicare Other | Attending: Internal Medicine

## 2023-05-24 DIAGNOSIS — I5022 Chronic systolic (congestive) heart failure: Secondary | ICD-10-CM

## 2023-05-24 DIAGNOSIS — Z95810 Presence of automatic (implantable) cardiac defibrillator: Secondary | ICD-10-CM

## 2023-05-25 NOTE — Progress Notes (Signed)
 EPIC Encounter for ICM Monitoring  Patient Name: Russell Fuentes is a 73 y.o. male Date: 05/25/2023 Primary Care Physican: Lupita Raider, MD Primary Cardiologist: Graciela Husbands Electrophysiologist: Joycelyn Schmid Pacing: 95.1%         03/24/2022 Weight: 227 lbs 04/28/2022   Weight: 227 lbs 02/04/2023 Weight: 227 lbs   Time in AT/AF  0.0 hr/day (0.0%)                                                  Attempted call to patient and unable to reach.  Left detailed message per DPR regarding transmission.  Transmission results reviewed.    Diet:  Typically eats restaurant foods 3 times a week.    Optivol Thoracic impedance suggesting fluid levels returned to normal.   Prescribed:  Furosemide 20 mg Take 1 tablet (20 mg total) by mouth every other day (takes 3 x week). Spironolactone 25 mg take 0.5 tablet (12.5 mg total) by mouth daily Farxiga 10 mg take 1 tablet daily    Labs: 09/06/2022 Creatinine 1.36, BUN 18, Potassium 4.2, Sodium 140, GFR 55 A complete set of results can be found in Results Review.   Recommendations:  Left voice mail with ICM number and encouraged to call if experiencing any fluid symptoms.   Follow-up plan: ICM clinic phone appointment on 06/16/2023.  91 day device clinic remote transmission 07/05/2023.       EP/Cardiology Office Visits:  Recall 09/23/2023 with Dr Graciela Husbands.     Copy of ICM check sent to Dr. Graciela Husbands.   3 month ICM trend: 05/25/2023.    12-14 Month ICM trend:     Karie Soda, RN 05/25/2023 7:14 AM

## 2023-05-26 ENCOUNTER — Telehealth: Payer: Self-pay

## 2023-05-26 NOTE — Progress Notes (Signed)
 Spoke with patient and heart failure questions reviewed.  Transmission results reviewed regarding fluid levels.  He received a call from Roney Mans from device clinic today, 2/20 regarding a change noted on 2/19 report and has appt in the clinic tomorrow, 2/21 for follow up.

## 2023-05-26 NOTE — Telephone Encounter (Signed)
 Alert received from [employer]:  LV Threshold last measured at 2.75V @ 1.69ms with programmed output 2.50V @ 1.16ms. Intermittent LV LOC noted on presenting EGM. Total VP 95.0% with Effective V Pacing 88.2%. Routed to clinic for review of LV lead.  Outreach made to Pt.  Pt scheduled to see device clinic 05/27/2023 at 9:20 am for LV lead management.

## 2023-05-27 ENCOUNTER — Ambulatory Visit: Payer: Medicare Other | Attending: Internal Medicine

## 2023-05-27 DIAGNOSIS — Z95810 Presence of automatic (implantable) cardiac defibrillator: Secondary | ICD-10-CM

## 2023-05-28 DIAGNOSIS — R0981 Nasal congestion: Secondary | ICD-10-CM | POA: Diagnosis not present

## 2023-05-28 DIAGNOSIS — H6690 Otitis media, unspecified, unspecified ear: Secondary | ICD-10-CM | POA: Diagnosis not present

## 2023-05-29 LAB — CUP PACEART INCLINIC DEVICE CHECK
Date Time Interrogation Session: 20250221134742
Eval Rhythm: UNDETERMINED
Implantable Lead Connection Status: 753985
Implantable Lead Connection Status: 753985
Implantable Lead Connection Status: 753985
Implantable Lead Implant Date: 20060323
Implantable Lead Implant Date: 20060323
Implantable Lead Implant Date: 20110420
Implantable Lead Location: 753858
Implantable Lead Location: 753859
Implantable Lead Location: 753860
Implantable Lead Model: 4193
Implantable Lead Model: 5076
Implantable Lead Model: 7121
Implantable Pulse Generator Implant Date: 20230626

## 2023-05-29 NOTE — Progress Notes (Signed)
 Device clinic CRTD troubleshooting appointment. Pt seen in clinic for intermittant LOC on LV lead. Known high LV threshold that is variable in clinic today anywhere from 1.25V--1 to 2.25--1. On 12 lead ECG Positive Lead 1 Neg V1 and QRS >200 which proves definitive LOC in LV. Second 12 lead EKG done after increased safety margins done w/ Lead 1 Pos. and V1 Neg as it were before with a more servicable QRS. On second EKG 1 run of NSVT x 4 beats. Reviewed w/ CL MD, unable to dedicate the time to optinize BV pacing and full BV optimization appointment needed. Appointment made 2/27 w/ SK in [place]. Per device 83.5% BV pacing but likely lower d/t pseudofusion of PVCs.  RV output increased to 3---4 for a 2:1 safety margin LV output increased to 3---1 to instigate increased BV therapy in the intermediary before seeing MD in office  See paceart for changes.

## 2023-05-30 DIAGNOSIS — R42 Dizziness and giddiness: Secondary | ICD-10-CM | POA: Diagnosis not present

## 2023-06-02 ENCOUNTER — Ambulatory Visit: Payer: Medicare Other | Attending: Internal Medicine | Admitting: Internal Medicine

## 2023-06-02 VITALS — BP 112/70 | HR 73 | Ht 70.0 in | Wt 231.0 lb

## 2023-06-02 DIAGNOSIS — I5022 Chronic systolic (congestive) heart failure: Secondary | ICD-10-CM | POA: Diagnosis not present

## 2023-06-02 DIAGNOSIS — Z95810 Presence of automatic (implantable) cardiac defibrillator: Secondary | ICD-10-CM | POA: Diagnosis not present

## 2023-06-02 LAB — CUP PACEART INCLINIC DEVICE CHECK
Battery Remaining Longevity: 63 mo
Battery Voltage: 2.94 V
Brady Statistic AP VP Percent: 55.08 %
Brady Statistic AP VS Percent: 0.82 %
Brady Statistic AS VP Percent: 43.08 %
Brady Statistic AS VS Percent: 1.01 %
Brady Statistic RA Percent Paced: 53.85 %
Brady Statistic RV Percent Paced: 2.98 %
Date Time Interrogation Session: 20250227103236
HighPow Impedance: 57 Ohm
HighPow Impedance: 74 Ohm
Implantable Lead Connection Status: 753985
Implantable Lead Connection Status: 753985
Implantable Lead Connection Status: 753985
Implantable Lead Implant Date: 20060323
Implantable Lead Implant Date: 20060323
Implantable Lead Implant Date: 20110420
Implantable Lead Location: 753858
Implantable Lead Location: 753859
Implantable Lead Location: 753860
Implantable Lead Model: 4193
Implantable Lead Model: 5076
Implantable Lead Model: 7121
Implantable Pulse Generator Implant Date: 20230626
Lead Channel Impedance Value: 304 Ohm
Lead Channel Impedance Value: 399 Ohm
Lead Channel Impedance Value: 4047 Ohm
Lead Channel Impedance Value: 4047 Ohm
Lead Channel Impedance Value: 456 Ohm
Lead Channel Impedance Value: 589 Ohm
Lead Channel Pacing Threshold Amplitude: 0.75 V
Lead Channel Pacing Threshold Amplitude: 1 V
Lead Channel Pacing Threshold Amplitude: 1 V
Lead Channel Pacing Threshold Amplitude: 1.375 V
Lead Channel Pacing Threshold Amplitude: 1.625 V
Lead Channel Pacing Threshold Pulse Width: 0.4 ms
Lead Channel Pacing Threshold Pulse Width: 0.4 ms
Lead Channel Pacing Threshold Pulse Width: 0.4 ms
Lead Channel Pacing Threshold Pulse Width: 1 ms
Lead Channel Pacing Threshold Pulse Width: 1 ms
Lead Channel Sensing Intrinsic Amplitude: 17.75 mV
Lead Channel Sensing Intrinsic Amplitude: 2.875 mV
Lead Channel Sensing Intrinsic Amplitude: 20.25 mV
Lead Channel Sensing Intrinsic Amplitude: 3.25 mV
Lead Channel Setting Pacing Amplitude: 1.5 V
Lead Channel Setting Pacing Amplitude: 2 V
Lead Channel Setting Pacing Amplitude: 2.5 V
Lead Channel Setting Pacing Pulse Width: 0.4 ms
Lead Channel Setting Pacing Pulse Width: 1 ms
Lead Channel Setting Sensing Sensitivity: 0.6 mV
Zone Setting Status: 755011

## 2023-06-02 NOTE — Patient Instructions (Addendum)
 Medication Instructions:  Increase Lasix from 20 to 40 mg for 3 doses.  *If you need a refill on your cardiac medications before your next appointment, please call your pharmacy*   Follow-Up: At [facility], you and your health needs are our priority.  As part of our continuing mission to provide you with exceptional heart care, we have created designated Provider Care Teams.  These Care Teams include your primary Cardiologist (physician) and Advanced Practice Providers (APPs -  Physician Assistants and Nurse Practitioners) who all work together to provide you with the care you need, when you need it.  We recommend signing up for the patient portal called "MyChart".  Sign up information is provided on this After Visit Summary.  MyChart is used to connect with patients for Virtual Visits (Telemedicine).  Patients are able to view lab/test results, encounter notes, upcoming appointments, etc.  Non-urgent messages can be sent to your provider as well.   To learn more about what you can do with MyChart, go to [email or web address].    Your next appointment:   12 month(s)  Provider:   Nobie Putnam, MD

## 2023-06-02 NOTE — Progress Notes (Unsigned)
 Patient ID: Russell Fuentes, male   DOB: 1951-02-23, 73 y.o.   MRN: [id number]      Patient Care Team: Lupita Raider, MD as PCP - General (Family Medicine) Duke Salvia, MD as PCP - Electrophysiology (Cardiology)   HPI  Russell Fuentes is a 73 y.o. male Seen in followup for an ICD  CRT Medtronic implanted for nonischemic cardiomyopathy;  generator replacement 2016, 2023  Seen as add on today because of concerns for inadequate safety  margins on LV capture    The patient denies chest pain, shortness of breath, nocturnal dyspnea, orthopnea .  There have been no palpitations, lightheadedness or syncope.  Complains of some edema.    Otherwise feeling pretty good  Granddaughter died of a brain tumor 10-26-23 ([name])  Date Cr K Hgb  6/19 1.17 4.3 15.5  2/20 1.33 4.3    7/21 1.25 4.2 13.9  2/23 1.31 4.2   10/26/2023 1.27 4.5 15.2   DATE TEST EF   11/14 Echo   55-65 %   9/15 LHC  No obstructive CAD  5/18 Echo   35 %   5/18 Echo  45  AV/VV optimization  8/19 Echo  35-40%   7/21 Echo  40-45%   2/23 Echo  35-40% LA mod    Past Medical History:  Diagnosis Date   CHF (congestive heart failure) (HCC)    Chronic combined systolic and diastolic heart failure (HCC)     Diabetes mellitus with neuropathy (HCC)     Hypertension    Nonischemic cardiomyopathy (HCC)     a. s/p MDT CRTD   Orthostatic lightheadedness      Past Surgical History:  Procedure Laterality Date   BIV ICD GENERATOR CHANGEOUT N/A 09/28/2021   Procedure: BIV ICD GENERATOR CHANGEOUT;  Surgeon: Duke Salvia, MD;  Location: [facility] INVASIVE CV LAB;  Service: Cardiovascular;  Laterality: N/A;   CARDIAC CATHETERIZATION  12/31/1998   Bi-plane cine left ventriculography revealed low-normal left ventricular function with an ejection fraction in the 45% to 50% range.   CORONARY ARTERY BYPASS GRAFT     DOPPLER ECHOCARDIOGRAPHY  04/28/2011   LVEF 41% by simpson's borderline concentric LVH, global hypokinesis with regional  variation. Stage 1 diastolic dysfunction, normal LV filling pressure. Normal LA size. Right heart pacing wires noted. Trace MR, TR. Normal RVSP   EP IMPLANTABLE DEVICE N/A 09/16/2014   Procedure: ICD Generator Changeout;  Surgeon: Duke Salvia, MD;  Location: [facility] INVASIVE CV LAB;  Service: Cardiovascular;  Laterality: N/A;   LEFT HEART CATHETERIZATION WITH CORONARY ANGIOGRAM N/A 12/11/2013   Procedure: LEFT HEART CATHETERIZATION WITH CORONARY ANGIOGRAM;  Surgeon: Lesleigh Noe, MD;  Location: [facility] CATH LAB;  Service: Cardiovascular;  Laterality: N/A;   NM MYOCAR PERF EJECTION FRACTION  04/03/2009, 11/18/2005   The post stress myocardial perfusion images show a normal pattern of perfusion in all regions. The post-stress ejection fraction is 50%. No significant wall motion abnormalities noted. This is a low risk scan.    Current Outpatient Medications  Medication Sig Dispense Refill   carvedilol (COREG) 12.5 MG tablet TAKE 1 TABLET(12.5 MG) BY MOUTH TWICE DAILY 180 tablet 3   dapagliflozin propanediol (FARXIGA) 10 MG TABS tablet Take 10 mg by mouth daily.     furosemide (LASIX) 20 MG tablet TAKE 1 TABLET BY MOUTH EVERY OTHER DAY 45 tablet 1   glimepiride (AMARYL) 4 MG tablet Take 4 mg by mouth 2 (two) times daily.     lovastatin (  MEVACOR) 20 MG tablet Take 20 mg by mouth at bedtime.     spironolactone (ALDACTONE) 25 MG tablet TAKE 1/2 TABLET(12.5 MG) BY MOUTH DAILY 45 tablet 3   valsartan (DIOVAN) 80 MG tablet TAKE 1 TABLET(80 MG) BY MOUTH DAILY 90 tablet 3   No current facility-administered medications for this visit.    Allergies  Allergen Reactions   Entresto [Sacubitril-Valsartan]     Fatigue, joint pain. Ok on valsartan alone   Metformin Hcl Other (See Comments)   Codeine Rash    Review of Systems negative except from HPI and PMH  Physical Exam: Ht 5\' 10"  (1.778 m)   Wt 231 lb (104.8 kg)   BMI 33.15 kg/m  Well developed and well nourished in no acute distress HENT normal Neck  supple with JVP-flat Clear Device pocket well healed; without hematoma or erythema.  There is no tethering  Regular rate and rhythm, no  gallop No murmur Abd-soft with active BS No Clubbing cyanosis tr edema Skin-warm and dry A & Oriented  Grossly normal sensory and motor function  ECG sinus P-synchronous/ AV  pacing Neg QRS lead 1 and rS V1  Device function is normal.  Other vector were tried and without capture with the RING Programming changes LV threshold was variable from 1.85--2.25 V at 1 ms.  Output was programmed at 2.5 V.  RV threshold was 1 V.  It had been programmed at 3 it was reprogrammed at 2.0 V.  Longevity went from 3.6--4.8 years See Paceart for details     EKG sinus with P synchronous pacing.  Initial ECG showed QRS morphology that was negative in lead V I and positive in lead I.  Reviewing tracings back to 9/21 they were upright in lead V1 and negative in lead I  We will continue up again it was biphasic in lead V1 and negative in lead I.  We increased the LV offset from 0---20 ms with improvement in the operating QRS morphology lead V1   Assessment and  Plan  Nonischemic cardiomyopathy-   Congestive heart failure- chronic-diastolic  Diabetes  Implantable defibrillator-Medtronic- CRT device    High Risk Medication Surveillance Aldactone  Mildly volume overloaded.  Will have him increase his Lasix to 40 mg q. OD x 3 doses and then resume his standard dose Continue GDMT including Diovan Farxiga carvedilol and spironolactone.  He is intolerant of Entresto   Device was reprogrammed as above

## 2023-06-14 DIAGNOSIS — Z85828 Personal history of other malignant neoplasm of skin: Secondary | ICD-10-CM | POA: Diagnosis not present

## 2023-06-14 DIAGNOSIS — C44519 Basal cell carcinoma of skin of other part of trunk: Secondary | ICD-10-CM | POA: Diagnosis not present

## 2023-06-14 DIAGNOSIS — L812 Freckles: Secondary | ICD-10-CM | POA: Diagnosis not present

## 2023-06-14 DIAGNOSIS — D1801 Hemangioma of skin and subcutaneous tissue: Secondary | ICD-10-CM | POA: Diagnosis not present

## 2023-06-14 DIAGNOSIS — L821 Other seborrheic keratosis: Secondary | ICD-10-CM | POA: Diagnosis not present

## 2023-06-14 DIAGNOSIS — L304 Erythema intertrigo: Secondary | ICD-10-CM | POA: Diagnosis not present

## 2023-06-14 DIAGNOSIS — L570 Actinic keratosis: Secondary | ICD-10-CM | POA: Diagnosis not present

## 2023-06-14 DIAGNOSIS — D485 Neoplasm of uncertain behavior of skin: Secondary | ICD-10-CM | POA: Diagnosis not present

## 2023-06-16 ENCOUNTER — Ambulatory Visit: Payer: Medicare Other | Attending: Internal Medicine

## 2023-06-16 ENCOUNTER — Telehealth: Payer: Self-pay

## 2023-06-16 DIAGNOSIS — Z95810 Presence of automatic (implantable) cardiac defibrillator: Secondary | ICD-10-CM | POA: Diagnosis not present

## 2023-06-16 DIAGNOSIS — I5022 Chronic systolic (congestive) heart failure: Secondary | ICD-10-CM | POA: Diagnosis not present

## 2023-06-16 NOTE — Progress Notes (Signed)
 EPIC Encounter for ICM Monitoring  Patient Name: Russell Fuentes is a 72 y.o. male Date: 06/16/2023 Primary Care Physican: Lupita Raider, MD Primary Cardiologist: Graciela Husbands Electrophysiologist: Joycelyn Schmid Pacing: 95.1%         02/04/2023 Weight: 227 lbs 06/16/2023 Weight: 225 lbs   Time in AT/AF  <0.1 hr/day (<0.1%)                                                  Spoke with patient and heart failure questions reviewed.  Transmission results reviewed.  Pt asymptomatic for fluid accumulation.  Reports feeling well at this time and voices no complaints.     Diet:  Typically eats restaurant foods 3 times a week.    Optivol Thoracic impedance suggesting normal fluid levels since 2/22.   Prescribed:  Furosemide 20 mg Take 1 tablet (20 mg total) by mouth every other day (takes 3 x week). Spironolactone 25 mg take 0.5 tablet (12.5 mg total) by mouth daily Farxiga 10 mg take 1 tablet daily    Labs: 09/06/2022 Creatinine 1.36, BUN 18, Potassium 4.2, Sodium 140, GFR 55 A complete set of results can be found in Results Review.   Recommendations:  No changes and encouraged to call if experiencing any fluid symptoms.   Follow-up plan: ICM clinic phone appointment on 07/18/2023.  91 day device clinic remote transmission 07/05/2023.       EP/Cardiology Office Visits:  Recall 09/23/2023 with Dr Graciela Husbands.     Copy of ICM check sent to Dr. Graciela Husbands.   3 month ICM trend: 06/16/2023.    12-14 Month ICM trend:     Karie Soda, RN 06/16/2023 11:06 AM

## 2023-06-16 NOTE — Telephone Encounter (Signed)
 Spoke with patient and requested manual remote transmission to check monthly fluid levels. Assisted with sending manual transmission.

## 2023-06-20 DIAGNOSIS — R42 Dizziness and giddiness: Secondary | ICD-10-CM | POA: Diagnosis not present

## 2023-06-23 DIAGNOSIS — I130 Hypertensive heart and chronic kidney disease with heart failure and stage 1 through stage 4 chronic kidney disease, or unspecified chronic kidney disease: Secondary | ICD-10-CM | POA: Diagnosis not present

## 2023-06-23 DIAGNOSIS — E1122 Type 2 diabetes mellitus with diabetic chronic kidney disease: Secondary | ICD-10-CM | POA: Diagnosis not present

## 2023-06-23 DIAGNOSIS — N183 Chronic kidney disease, stage 3 unspecified: Secondary | ICD-10-CM | POA: Diagnosis not present

## 2023-06-23 DIAGNOSIS — I5022 Chronic systolic (congestive) heart failure: Secondary | ICD-10-CM | POA: Diagnosis not present

## 2023-06-29 DIAGNOSIS — R0981 Nasal congestion: Secondary | ICD-10-CM | POA: Diagnosis not present

## 2023-06-29 DIAGNOSIS — H6123 Impacted cerumen, bilateral: Secondary | ICD-10-CM | POA: Diagnosis not present

## 2023-06-29 DIAGNOSIS — H9202 Otalgia, left ear: Secondary | ICD-10-CM | POA: Diagnosis not present

## 2023-06-29 DIAGNOSIS — H6092 Unspecified otitis externa, left ear: Secondary | ICD-10-CM | POA: Diagnosis not present

## 2023-07-04 DIAGNOSIS — I5022 Chronic systolic (congestive) heart failure: Secondary | ICD-10-CM | POA: Diagnosis not present

## 2023-07-04 DIAGNOSIS — N183 Chronic kidney disease, stage 3 unspecified: Secondary | ICD-10-CM | POA: Diagnosis not present

## 2023-07-04 DIAGNOSIS — E669 Obesity, unspecified: Secondary | ICD-10-CM | POA: Diagnosis not present

## 2023-07-04 DIAGNOSIS — E782 Mixed hyperlipidemia: Secondary | ICD-10-CM | POA: Diagnosis not present

## 2023-07-05 ENCOUNTER — Ambulatory Visit (INDEPENDENT_AMBULATORY_CARE_PROVIDER_SITE_OTHER): Payer: Medicare Other

## 2023-07-05 DIAGNOSIS — I428 Other cardiomyopathies: Secondary | ICD-10-CM

## 2023-07-06 LAB — CUP PACEART REMOTE DEVICE CHECK
Battery Remaining Longevity: 60 mo
Battery Voltage: 2.98 V
Brady Statistic AP VP Percent: 52.5 %
Brady Statistic AP VS Percent: 0.8 %
Brady Statistic AS VP Percent: 44.75 %
Brady Statistic AS VS Percent: 1.95 %
Brady Statistic RA Percent Paced: 51.6 %
Brady Statistic RV Percent Paced: 7.53 %
Date Time Interrogation Session: 20250401001704
HighPow Impedance: 55 Ohm
HighPow Impedance: 67 Ohm
Implantable Lead Connection Status: 753985
Implantable Lead Connection Status: 753985
Implantable Lead Connection Status: 753985
Implantable Lead Implant Date: 20060323
Implantable Lead Implant Date: 20060323
Implantable Lead Implant Date: 20110420
Implantable Lead Location: 753858
Implantable Lead Location: 753859
Implantable Lead Location: 753860
Implantable Lead Model: 4193
Implantable Lead Model: 5076
Implantable Lead Model: 7121
Implantable Pulse Generator Implant Date: 20230626
Lead Channel Impedance Value: 304 Ohm
Lead Channel Impedance Value: 361 Ohm
Lead Channel Impedance Value: 4047 Ohm
Lead Channel Impedance Value: 4047 Ohm
Lead Channel Impedance Value: 418 Ohm
Lead Channel Impedance Value: 513 Ohm
Lead Channel Pacing Threshold Amplitude: 0.75 V
Lead Channel Pacing Threshold Amplitude: 1.5 V
Lead Channel Pacing Threshold Amplitude: 2.75 V
Lead Channel Pacing Threshold Pulse Width: 0.4 ms
Lead Channel Pacing Threshold Pulse Width: 0.4 ms
Lead Channel Pacing Threshold Pulse Width: 1 ms
Lead Channel Sensing Intrinsic Amplitude: 19.875 mV
Lead Channel Sensing Intrinsic Amplitude: 19.875 mV
Lead Channel Sensing Intrinsic Amplitude: 2.875 mV
Lead Channel Sensing Intrinsic Amplitude: 2.875 mV
Lead Channel Setting Pacing Amplitude: 1.5 V
Lead Channel Setting Pacing Amplitude: 2 V
Lead Channel Setting Pacing Amplitude: 2.5 V
Lead Channel Setting Pacing Pulse Width: 0.4 ms
Lead Channel Setting Pacing Pulse Width: 1 ms
Lead Channel Setting Sensing Sensitivity: 0.6 mV
Zone Setting Status: 755011

## 2023-07-15 DIAGNOSIS — R972 Elevated prostate specific antigen [PSA]: Secondary | ICD-10-CM | POA: Diagnosis not present

## 2023-07-18 ENCOUNTER — Ambulatory Visit: Attending: Internal Medicine

## 2023-07-18 DIAGNOSIS — I5022 Chronic systolic (congestive) heart failure: Secondary | ICD-10-CM

## 2023-07-18 DIAGNOSIS — Z95810 Presence of automatic (implantable) cardiac defibrillator: Secondary | ICD-10-CM | POA: Diagnosis not present

## 2023-07-22 ENCOUNTER — Other Ambulatory Visit: Payer: Self-pay | Admitting: Internal Medicine

## 2023-07-22 DIAGNOSIS — I130 Hypertensive heart and chronic kidney disease with heart failure and stage 1 through stage 4 chronic kidney disease, or unspecified chronic kidney disease: Secondary | ICD-10-CM | POA: Diagnosis not present

## 2023-07-22 DIAGNOSIS — I428 Other cardiomyopathies: Secondary | ICD-10-CM

## 2023-07-22 DIAGNOSIS — I5022 Chronic systolic (congestive) heart failure: Secondary | ICD-10-CM | POA: Diagnosis not present

## 2023-07-22 DIAGNOSIS — N183 Chronic kidney disease, stage 3 unspecified: Secondary | ICD-10-CM | POA: Diagnosis not present

## 2023-07-22 DIAGNOSIS — E1122 Type 2 diabetes mellitus with diabetic chronic kidney disease: Secondary | ICD-10-CM | POA: Diagnosis not present

## 2023-07-22 NOTE — Progress Notes (Signed)
 EPIC Encounter for ICM Monitoring  Patient Name: Russell Fuentes is a 73 y.o. male Date: 07/22/2023 Primary Care Physican: Glena Landau, MD Primary Cardiologist: Rodolfo Clan Electrophysiologist: Victorino Grates Pacing: 93.9%         02/04/2023 Weight: 227 lbs 06/16/2023 Weight: 225 lbs    Time in AT/AF  <0.1 hr/day (<0.1%)                                                  Spoke with patient and heart failure questions reviewed.  Transmission results reviewed.  Pt asymptomatic for fluid accumulation.  He had vertigo while he was at the beach this week.   Diet:  Typically eats restaurant foods 3 times a week.    Optivol Thoracic impedance suggesting normal fluid levels with the exception of possible fluid accumulation from 4/3-4/9.   Prescribed:  Furosemide  20 mg Take 1 tablet (20 mg total) by mouth every other day (takes 3 x week). Spironolactone  25 mg take 0.5 tablet (12.5 mg total) by mouth daily Farxiga 10 mg take 1 tablet daily    Labs: 09/06/2022 Creatinine 1.36, BUN 18, Potassium 4.2, Sodium 140, GFR 55 A complete set of results can be found in Results Review.   Recommendations:  No changes and encouraged to call if experiencing any fluid symptoms.   Follow-up plan: ICM clinic phone appointment on 08/30/2023.  91 day device clinic remote transmission 10/04/2023.       EP/Cardiology Office Visits:  Recall 09/23/2023 with Dr Rodolfo Clan.     Copy of ICM check sent to Dr. Rodolfo Clan.   3 month ICM trend: 07/18/2023.    12-14 Month ICM trend:     Almyra Jain, RN 07/22/2023 1:37 PM

## 2023-07-26 DIAGNOSIS — R351 Nocturia: Secondary | ICD-10-CM | POA: Diagnosis not present

## 2023-07-26 DIAGNOSIS — N401 Enlarged prostate with lower urinary tract symptoms: Secondary | ICD-10-CM | POA: Diagnosis not present

## 2023-07-26 DIAGNOSIS — R972 Elevated prostate specific antigen [PSA]: Secondary | ICD-10-CM | POA: Diagnosis not present

## 2023-07-26 DIAGNOSIS — R350 Frequency of micturition: Secondary | ICD-10-CM | POA: Diagnosis not present

## 2023-07-29 DIAGNOSIS — R051 Acute cough: Secondary | ICD-10-CM | POA: Diagnosis not present

## 2023-07-29 DIAGNOSIS — R0981 Nasal congestion: Secondary | ICD-10-CM | POA: Diagnosis not present

## 2023-07-29 DIAGNOSIS — H9202 Otalgia, left ear: Secondary | ICD-10-CM | POA: Diagnosis not present

## 2023-08-03 DIAGNOSIS — E782 Mixed hyperlipidemia: Secondary | ICD-10-CM | POA: Diagnosis not present

## 2023-08-03 DIAGNOSIS — I130 Hypertensive heart and chronic kidney disease with heart failure and stage 1 through stage 4 chronic kidney disease, or unspecified chronic kidney disease: Secondary | ICD-10-CM | POA: Diagnosis not present

## 2023-08-03 DIAGNOSIS — N183 Chronic kidney disease, stage 3 unspecified: Secondary | ICD-10-CM | POA: Diagnosis not present

## 2023-08-03 DIAGNOSIS — I5022 Chronic systolic (congestive) heart failure: Secondary | ICD-10-CM | POA: Diagnosis not present

## 2023-08-03 DIAGNOSIS — E1122 Type 2 diabetes mellitus with diabetic chronic kidney disease: Secondary | ICD-10-CM | POA: Diagnosis not present

## 2023-08-03 DIAGNOSIS — E669 Obesity, unspecified: Secondary | ICD-10-CM | POA: Diagnosis not present

## 2023-08-04 ENCOUNTER — Encounter: Payer: Self-pay | Admitting: Internal Medicine

## 2023-08-16 DIAGNOSIS — H93A2 Pulsatile tinnitus, left ear: Secondary | ICD-10-CM | POA: Diagnosis not present

## 2023-08-16 DIAGNOSIS — H90A32 Mixed conductive and sensorineural hearing loss, unilateral, left ear with restricted hearing on the contralateral side: Secondary | ICD-10-CM | POA: Diagnosis not present

## 2023-08-16 DIAGNOSIS — H9312 Tinnitus, left ear: Secondary | ICD-10-CM | POA: Diagnosis not present

## 2023-08-16 DIAGNOSIS — Z0110 Encounter for examination of ears and hearing without abnormal findings: Secondary | ICD-10-CM | POA: Diagnosis not present

## 2023-08-16 DIAGNOSIS — Z885 Allergy status to narcotic agent status: Secondary | ICD-10-CM | POA: Diagnosis not present

## 2023-08-16 DIAGNOSIS — H9122 Sudden idiopathic hearing loss, left ear: Secondary | ICD-10-CM | POA: Diagnosis not present

## 2023-08-16 DIAGNOSIS — H938X2 Other specified disorders of left ear: Secondary | ICD-10-CM | POA: Diagnosis not present

## 2023-08-16 DIAGNOSIS — Z950 Presence of cardiac pacemaker: Secondary | ICD-10-CM | POA: Diagnosis not present

## 2023-08-18 NOTE — Addendum Note (Signed)
 Addended by: Lott Rouleau A on: 08/18/2023 10:30 AM   Modules accepted: Orders

## 2023-08-18 NOTE — Progress Notes (Signed)
 Remote ICD transmission.

## 2023-08-21 DIAGNOSIS — E1122 Type 2 diabetes mellitus with diabetic chronic kidney disease: Secondary | ICD-10-CM | POA: Diagnosis not present

## 2023-08-21 DIAGNOSIS — N183 Chronic kidney disease, stage 3 unspecified: Secondary | ICD-10-CM | POA: Diagnosis not present

## 2023-08-21 DIAGNOSIS — I5022 Chronic systolic (congestive) heart failure: Secondary | ICD-10-CM | POA: Diagnosis not present

## 2023-08-21 DIAGNOSIS — I130 Hypertensive heart and chronic kidney disease with heart failure and stage 1 through stage 4 chronic kidney disease, or unspecified chronic kidney disease: Secondary | ICD-10-CM | POA: Diagnosis not present

## 2023-08-30 ENCOUNTER — Ambulatory Visit: Attending: Cardiology

## 2023-08-30 DIAGNOSIS — I5022 Chronic systolic (congestive) heart failure: Secondary | ICD-10-CM

## 2023-08-30 DIAGNOSIS — Z95810 Presence of automatic (implantable) cardiac defibrillator: Secondary | ICD-10-CM | POA: Diagnosis not present

## 2023-08-31 DIAGNOSIS — J0100 Acute maxillary sinusitis, unspecified: Secondary | ICD-10-CM | POA: Diagnosis not present

## 2023-08-31 NOTE — Progress Notes (Signed)
 EPIC Encounter for ICM Monitoring  Patient Name: Russell Fuentes is a 73 y.o. male Date: 08/31/2023 Primary Care Physican: Glena Landau, MD Primary Cardiologist: Daneil Dunker Electrophysiologist: Gareth Junes Pacing: 93.1%         02/04/2023 Weight: 227 lbs 06/16/2023 Weight: 225 lbs  08/31/2023 Weight: 223 lbs   Time in AT/AF  <0.1 hr/day (<0.1%)                                                        Spoke with patient and heart failure questions reviewed.  Transmission results reviewed.  Pt asymptomatic for fluid accumulation.  He feels like he has fluid because he gets in his ear.   Diet:  Typically eats restaurant foods 3 times a week.    Optivol Thoracic impedance suggesting normal fluid levels with the exception of possible fluid accumulation starting 5/22.   Prescribed:  Furosemide  20 mg Take 1 tablet (20 mg total) by mouth every other day. Spironolactone  25 mg take 0.5 tablet (12.5 mg total) by mouth daily Farxiga 10 mg take 1 tablet daily    Labs: 09/06/2022 Creatinine 1.36, BUN 18, Potassium 4.2, Sodium 140, GFR 55 A complete set of results can be found in Results Review.   Recommendations:  He will take extra Lasix  this week.     Follow-up plan: ICM clinic phone appointment on 09/05/2023 to recheck fluid levels.  91 day device clinic remote transmission 10/04/2023.       EP/Cardiology Office Visits:  Recall 09/23/2023 with Dr Daneil Dunker.     Copy of ICM check sent to Dr. Daneil Dunker.   3 month ICM trend: 08/30/2023.    12-14 Month ICM trend:     Almyra Jain, RN 08/31/2023 3:14 PM

## 2023-09-03 DIAGNOSIS — E1122 Type 2 diabetes mellitus with diabetic chronic kidney disease: Secondary | ICD-10-CM | POA: Diagnosis not present

## 2023-09-03 DIAGNOSIS — I130 Hypertensive heart and chronic kidney disease with heart failure and stage 1 through stage 4 chronic kidney disease, or unspecified chronic kidney disease: Secondary | ICD-10-CM | POA: Diagnosis not present

## 2023-09-03 DIAGNOSIS — E782 Mixed hyperlipidemia: Secondary | ICD-10-CM | POA: Diagnosis not present

## 2023-09-03 DIAGNOSIS — I5022 Chronic systolic (congestive) heart failure: Secondary | ICD-10-CM | POA: Diagnosis not present

## 2023-09-03 DIAGNOSIS — E669 Obesity, unspecified: Secondary | ICD-10-CM | POA: Diagnosis not present

## 2023-09-03 DIAGNOSIS — N183 Chronic kidney disease, stage 3 unspecified: Secondary | ICD-10-CM | POA: Diagnosis not present

## 2023-09-05 ENCOUNTER — Ambulatory Visit: Attending: Cardiology

## 2023-09-05 DIAGNOSIS — Z95810 Presence of automatic (implantable) cardiac defibrillator: Secondary | ICD-10-CM

## 2023-09-05 DIAGNOSIS — I5022 Chronic systolic (congestive) heart failure: Secondary | ICD-10-CM

## 2023-09-06 NOTE — Progress Notes (Signed)
 EPIC Encounter for ICM Monitoring  Patient Name: Russell Fuentes is a 73 y.o. male Date: 09/06/2023 Primary Care Physican: Glena Landau, MD Primary Cardiologist: Daneil Dunker Electrophysiologist: Gareth Junes Pacing: 97.0%         02/04/2023 Weight: 227 lbs 06/16/2023 Weight: 225 lbs  08/31/2023 Weight: 223 lbs   Time in AT/AF  <0.1 hr/day (<0.1%)                                                        Spoke with patient and heart failure questions reviewed.  Transmission results reviewed.  Pt asymptomatic for fluid accumulation.  He can tell when he has fluid by if his sock leaves a ring and also if his ears have fluid.    Diet:  09/06/2023 Discussed diet and he eats foods high in salt.  Typically eats restaurant foods 3 times a week.     Optivol Thoracic impedance suggesting fluid levels returned to trending close to normal after taking extra Lasix .   Prescribed:  Furosemide  20 mg Take 1 tablet (20 mg total) by mouth every other day. Spironolactone  25 mg take 0.5 tablet (12.5 mg total) by mouth daily Farxiga 10 mg take 1 tablet daily    Labs: 09/06/2022 Creatinine 1.36, BUN 18, Potassium 4.2, Sodium 140, GFR 55 A complete set of results can be found in Results Review.   Recommendations:   Encouraged to review food labels and to limit salt to 2000 mg daily.     Follow-up plan: ICM clinic phone appointment on 10/17/2023.  91 day device clinic remote transmission 10/04/2023.       EP/Cardiology Office Visits:  Recall 09/29/2024 with Dr Daneil Dunker.     Copy of ICM check sent to Dr. Daneil Dunker.   3 month ICM trend: 09/05/2023.    12-14 Month ICM trend:     Almyra Jain, RN 09/06/2023 12:52 PM

## 2023-09-20 DIAGNOSIS — Z0000 Encounter for general adult medical examination without abnormal findings: Secondary | ICD-10-CM | POA: Diagnosis not present

## 2023-09-20 DIAGNOSIS — I429 Cardiomyopathy, unspecified: Secondary | ICD-10-CM | POA: Diagnosis not present

## 2023-09-20 DIAGNOSIS — E669 Obesity, unspecified: Secondary | ICD-10-CM | POA: Diagnosis not present

## 2023-09-20 DIAGNOSIS — I130 Hypertensive heart and chronic kidney disease with heart failure and stage 1 through stage 4 chronic kidney disease, or unspecified chronic kidney disease: Secondary | ICD-10-CM | POA: Diagnosis not present

## 2023-09-20 DIAGNOSIS — E782 Mixed hyperlipidemia: Secondary | ICD-10-CM | POA: Diagnosis not present

## 2023-09-20 DIAGNOSIS — J309 Allergic rhinitis, unspecified: Secondary | ICD-10-CM | POA: Diagnosis not present

## 2023-09-20 DIAGNOSIS — N183 Chronic kidney disease, stage 3 unspecified: Secondary | ICD-10-CM | POA: Diagnosis not present

## 2023-09-20 DIAGNOSIS — Z1331 Encounter for screening for depression: Secondary | ICD-10-CM | POA: Diagnosis not present

## 2023-09-20 DIAGNOSIS — I5022 Chronic systolic (congestive) heart failure: Secondary | ICD-10-CM | POA: Diagnosis not present

## 2023-09-20 DIAGNOSIS — E1122 Type 2 diabetes mellitus with diabetic chronic kidney disease: Secondary | ICD-10-CM | POA: Diagnosis not present

## 2023-10-03 DIAGNOSIS — E1122 Type 2 diabetes mellitus with diabetic chronic kidney disease: Secondary | ICD-10-CM | POA: Diagnosis not present

## 2023-10-03 DIAGNOSIS — I130 Hypertensive heart and chronic kidney disease with heart failure and stage 1 through stage 4 chronic kidney disease, or unspecified chronic kidney disease: Secondary | ICD-10-CM | POA: Diagnosis not present

## 2023-10-03 DIAGNOSIS — I5022 Chronic systolic (congestive) heart failure: Secondary | ICD-10-CM | POA: Diagnosis not present

## 2023-10-03 DIAGNOSIS — E782 Mixed hyperlipidemia: Secondary | ICD-10-CM | POA: Diagnosis not present

## 2023-10-03 DIAGNOSIS — N183 Chronic kidney disease, stage 3 unspecified: Secondary | ICD-10-CM | POA: Diagnosis not present

## 2023-10-03 DIAGNOSIS — H9202 Otalgia, left ear: Secondary | ICD-10-CM | POA: Diagnosis not present

## 2023-10-03 DIAGNOSIS — E669 Obesity, unspecified: Secondary | ICD-10-CM | POA: Diagnosis not present

## 2023-10-04 ENCOUNTER — Ambulatory Visit (INDEPENDENT_AMBULATORY_CARE_PROVIDER_SITE_OTHER): Payer: Medicare Other

## 2023-10-04 DIAGNOSIS — I5022 Chronic systolic (congestive) heart failure: Secondary | ICD-10-CM | POA: Diagnosis not present

## 2023-10-04 LAB — CUP PACEART REMOTE DEVICE CHECK
Battery Remaining Longevity: 57 mo
Battery Voltage: 2.98 V
Brady Statistic AP VP Percent: 47.41 %
Brady Statistic AP VS Percent: 0.72 %
Brady Statistic AS VP Percent: 50.39 %
Brady Statistic AS VS Percent: 1.48 %
Brady Statistic RA Percent Paced: 47.02 %
Brady Statistic RV Percent Paced: 4.31 %
Date Time Interrogation Session: 20250701012405
HighPow Impedance: 58 Ohm
HighPow Impedance: 74 Ohm
Implantable Lead Connection Status: 753985
Implantable Lead Connection Status: 753985
Implantable Lead Connection Status: 753985
Implantable Lead Implant Date: 20060323
Implantable Lead Implant Date: 20060323
Implantable Lead Implant Date: 20110420
Implantable Lead Location: 753858
Implantable Lead Location: 753859
Implantable Lead Location: 753860
Implantable Lead Model: 4193
Implantable Lead Model: 5076
Implantable Lead Model: 7121
Implantable Pulse Generator Implant Date: 20230626
Lead Channel Impedance Value: 361 Ohm
Lead Channel Impedance Value: 4047 Ohm
Lead Channel Impedance Value: 4047 Ohm
Lead Channel Impedance Value: 418 Ohm
Lead Channel Impedance Value: 475 Ohm
Lead Channel Impedance Value: 513 Ohm
Lead Channel Pacing Threshold Amplitude: 0.75 V
Lead Channel Pacing Threshold Amplitude: 1.375 V
Lead Channel Pacing Threshold Amplitude: 2 V
Lead Channel Pacing Threshold Pulse Width: 0.4 ms
Lead Channel Pacing Threshold Pulse Width: 0.4 ms
Lead Channel Pacing Threshold Pulse Width: 1 ms
Lead Channel Sensing Intrinsic Amplitude: 2.625 mV
Lead Channel Sensing Intrinsic Amplitude: 2.625 mV
Lead Channel Sensing Intrinsic Amplitude: 22.5 mV
Lead Channel Sensing Intrinsic Amplitude: 22.5 mV
Lead Channel Setting Pacing Amplitude: 1.5 V
Lead Channel Setting Pacing Amplitude: 2 V
Lead Channel Setting Pacing Amplitude: 2.5 V
Lead Channel Setting Pacing Pulse Width: 0.4 ms
Lead Channel Setting Pacing Pulse Width: 1 ms
Lead Channel Setting Sensing Sensitivity: 0.6 mV
Zone Setting Status: 755011

## 2023-10-05 ENCOUNTER — Ambulatory Visit: Payer: Self-pay | Admitting: Cardiology

## 2023-10-05 ENCOUNTER — Other Ambulatory Visit: Payer: Self-pay | Admitting: Internal Medicine

## 2023-10-17 ENCOUNTER — Ambulatory Visit: Attending: Cardiology

## 2023-10-17 DIAGNOSIS — Z95810 Presence of automatic (implantable) cardiac defibrillator: Secondary | ICD-10-CM

## 2023-10-17 DIAGNOSIS — I5022 Chronic systolic (congestive) heart failure: Secondary | ICD-10-CM | POA: Diagnosis not present

## 2023-10-20 DIAGNOSIS — I130 Hypertensive heart and chronic kidney disease with heart failure and stage 1 through stage 4 chronic kidney disease, or unspecified chronic kidney disease: Secondary | ICD-10-CM | POA: Diagnosis not present

## 2023-10-20 DIAGNOSIS — I5022 Chronic systolic (congestive) heart failure: Secondary | ICD-10-CM | POA: Diagnosis not present

## 2023-10-20 DIAGNOSIS — N183 Chronic kidney disease, stage 3 unspecified: Secondary | ICD-10-CM | POA: Diagnosis not present

## 2023-10-20 DIAGNOSIS — E1122 Type 2 diabetes mellitus with diabetic chronic kidney disease: Secondary | ICD-10-CM | POA: Diagnosis not present

## 2023-10-24 NOTE — Progress Notes (Signed)
 EPIC Encounter for ICM Monitoring  Patient Name: Russell Fuentes is a 73 y.o. male Date: 10/24/2023 Primary Care Physican: Loreli Kins, MD Primary Cardiologist: Kennyth Electrophysiologist: Kennyth Pore Pacing: 91.9%  (Effective 75.5%)        02/04/2023 Weight: 227 lbs 06/16/2023 Weight: 225 lbs  08/31/2023 Weight: 223 lbs 10/24/2023 Weight: 222 lbs   Time in AT/AF  0.0 hr/day (0.0%)                                                  Spoke with patient and heart failure questions reviewed.  Transmission results reviewed.  Pt asymptomatic for fluid accumulation.     Diet:  09/06/2023 Discussed diet and he eats foods high in salt.  Typically eats restaurant foods 3 times a week.     Optivol Thoracic impedance suggesting normal fluid levels within the last month.   Prescribed:  Furosemide  20 mg Take 1 tablet (20 mg total) by mouth every other day. Spironolactone  25 mg take 0.5 tablet (12.5 mg total) by mouth daily   Labs: 09/06/2022 Creatinine 1.36, BUN 18, Potassium 4.2, Sodium 140, GFR 55 A complete set of results can be found in Results Review.   Recommendations:  No changes and encouraged to call if experiencing any fluid symptoms..     Follow-up plan: ICM clinic phone appointment on 11/21/2023.  91 day device clinic remote transmission 01/03/2024.       EP/Cardiology Office Visits:  Recall 09/29/2024 with Dr Kennyth.     Copy of ICM check sent to Dr. Kennyth.    3 month ICM trend: 10/17/2023.    12-14 Month ICM trend:     Russell GORMAN Garner, RN 10/24/2023 1:52 PM

## 2023-11-03 DIAGNOSIS — I130 Hypertensive heart and chronic kidney disease with heart failure and stage 1 through stage 4 chronic kidney disease, or unspecified chronic kidney disease: Secondary | ICD-10-CM | POA: Diagnosis not present

## 2023-11-03 DIAGNOSIS — N183 Chronic kidney disease, stage 3 unspecified: Secondary | ICD-10-CM | POA: Diagnosis not present

## 2023-11-03 DIAGNOSIS — E782 Mixed hyperlipidemia: Secondary | ICD-10-CM | POA: Diagnosis not present

## 2023-11-03 DIAGNOSIS — E1122 Type 2 diabetes mellitus with diabetic chronic kidney disease: Secondary | ICD-10-CM | POA: Diagnosis not present

## 2023-11-03 DIAGNOSIS — E669 Obesity, unspecified: Secondary | ICD-10-CM | POA: Diagnosis not present

## 2023-11-03 DIAGNOSIS — I5022 Chronic systolic (congestive) heart failure: Secondary | ICD-10-CM | POA: Diagnosis not present

## 2023-11-19 DIAGNOSIS — I130 Hypertensive heart and chronic kidney disease with heart failure and stage 1 through stage 4 chronic kidney disease, or unspecified chronic kidney disease: Secondary | ICD-10-CM | POA: Diagnosis not present

## 2023-11-19 DIAGNOSIS — I5022 Chronic systolic (congestive) heart failure: Secondary | ICD-10-CM | POA: Diagnosis not present

## 2023-11-19 DIAGNOSIS — N183 Chronic kidney disease, stage 3 unspecified: Secondary | ICD-10-CM | POA: Diagnosis not present

## 2023-11-19 DIAGNOSIS — E1122 Type 2 diabetes mellitus with diabetic chronic kidney disease: Secondary | ICD-10-CM | POA: Diagnosis not present

## 2023-11-21 ENCOUNTER — Ambulatory Visit: Attending: Cardiology

## 2023-11-21 DIAGNOSIS — Z95810 Presence of automatic (implantable) cardiac defibrillator: Secondary | ICD-10-CM

## 2023-11-21 DIAGNOSIS — I5022 Chronic systolic (congestive) heart failure: Secondary | ICD-10-CM

## 2023-11-25 NOTE — Progress Notes (Signed)
 EPIC Encounter for ICM Monitoring  Patient Name: Russell Fuentes is a 73 y.o. male Date: 11/25/2023 Primary Care Physican: Loreli Kins, MD Primary Cardiologist: Kennyth Electrophysiologist: Kennyth Pore Pacing: 94.7%  (Effective 82%)        02/04/2023 Weight: 227 lbs 06/16/2023 Weight: 225 lbs  08/31/2023 Weight: 223 lbs 10/24/2023 Weight: 222 lbs   Time in AT/AF <0.1 hr/day (<0.1%)                                                Spoke with patient and heart failure questions reviewed.  Transmission results reviewed.  Pt asymptomatic for fluid accumulation.  Reports feeling well at this time and voices no complaints.     Diet:  09/06/2023 Discussed diet and he eats foods high in salt.  Typically eats restaurant foods 3 times a week.     Optivol Thoracic impedance suggesting normal fluid levels within the last month.   Prescribed:  Furosemide  20 mg Take 1 tablet (20 mg total) by mouth every other day. Spironolactone  25 mg take 0.5 tablet (12.5 mg total) by mouth daily   Labs: 09/06/2022 Creatinine 1.36, BUN 18, Potassium 4.2, Sodium 140, GFR 55 A complete set of results can be found in Results Review.   Recommendations:  No changes and encouraged to call if experiencing any fluid symptoms.   Follow-up plan: ICM clinic phone appointment on 12/26/2023.  91 day device clinic remote transmission 01/03/2024.       EP/Cardiology Office Visits:  Recall 09/29/2024 with Dr Kennyth.     Copy of ICM check sent to Dr. Kennyth.    3 month ICM trend: 11/21/2023.    12-14 Month ICM trend:     [name], RN 11/25/2023 1:50 PM

## 2023-12-04 DIAGNOSIS — E1122 Type 2 diabetes mellitus with diabetic chronic kidney disease: Secondary | ICD-10-CM | POA: Diagnosis not present

## 2023-12-04 DIAGNOSIS — N183 Chronic kidney disease, stage 3 unspecified: Secondary | ICD-10-CM | POA: Diagnosis not present

## 2023-12-04 DIAGNOSIS — I5022 Chronic systolic (congestive) heart failure: Secondary | ICD-10-CM | POA: Diagnosis not present

## 2023-12-04 DIAGNOSIS — I130 Hypertensive heart and chronic kidney disease with heart failure and stage 1 through stage 4 chronic kidney disease, or unspecified chronic kidney disease: Secondary | ICD-10-CM | POA: Diagnosis not present

## 2023-12-04 DIAGNOSIS — E782 Mixed hyperlipidemia: Secondary | ICD-10-CM | POA: Diagnosis not present

## 2023-12-04 DIAGNOSIS — E669 Obesity, unspecified: Secondary | ICD-10-CM | POA: Diagnosis not present

## 2023-12-15 DIAGNOSIS — L821 Other seborrheic keratosis: Secondary | ICD-10-CM | POA: Diagnosis not present

## 2023-12-15 DIAGNOSIS — Z85828 Personal history of other malignant neoplasm of skin: Secondary | ICD-10-CM | POA: Diagnosis not present

## 2023-12-15 DIAGNOSIS — L570 Actinic keratosis: Secondary | ICD-10-CM | POA: Diagnosis not present

## 2023-12-19 DIAGNOSIS — E1122 Type 2 diabetes mellitus with diabetic chronic kidney disease: Secondary | ICD-10-CM | POA: Diagnosis not present

## 2023-12-19 DIAGNOSIS — N183 Chronic kidney disease, stage 3 unspecified: Secondary | ICD-10-CM | POA: Diagnosis not present

## 2023-12-19 DIAGNOSIS — I130 Hypertensive heart and chronic kidney disease with heart failure and stage 1 through stage 4 chronic kidney disease, or unspecified chronic kidney disease: Secondary | ICD-10-CM | POA: Diagnosis not present

## 2023-12-19 DIAGNOSIS — I5022 Chronic systolic (congestive) heart failure: Secondary | ICD-10-CM | POA: Diagnosis not present

## 2023-12-26 ENCOUNTER — Ambulatory Visit: Attending: Cardiology

## 2023-12-26 ENCOUNTER — Telehealth: Payer: Self-pay

## 2023-12-26 DIAGNOSIS — I5022 Chronic systolic (congestive) heart failure: Secondary | ICD-10-CM

## 2023-12-26 DIAGNOSIS — Z95810 Presence of automatic (implantable) cardiac defibrillator: Secondary | ICD-10-CM | POA: Diagnosis not present

## 2023-12-26 NOTE — Progress Notes (Signed)
 EPIC Encounter for ICM Monitoring  Patient Name: Russell Fuentes is a 73 y.o. male Date: 12/26/2023 Primary Care Physican: Loreli Kins, MD Primary Cardiologist: Kennyth Electrophysiologist: Kennyth Pore Pacing: 95.2%  (Effective 75.5%)        02/04/2023 Weight: 227 lbs 06/16/2023 Weight: 225 lbs  08/31/2023 Weight: 223 lbs 10/24/2023 Weight: 222 lbs   Time in AT/AF <0.1 hr/day (<0.1%)                                                Attempted call to patient and unable to reach.  Left detailed message per DPR regarding transmission.  Transmission results reviewed.    Diet:  09/06/2023 Discussed diet and he eats foods high in salt.  Typically eats restaurant foods 3 times a week.     Optivol Thoracic impedance suggesting intermittent days with possible fluid accumulation within the last month.   Prescribed:  Furosemide  20 mg Take 1 tablet (20 mg total) by mouth every other day. Spironolactone  25 mg take 0.5 tablet (12.5 mg total) by mouth daily   Labs: 09/06/2022 Creatinine 1.36, BUN 18, Potassium 4.2, Sodium 140, GFR 55 A complete set of results can be found in Results Review.   Recommendations:  Left voice mail with ICM number and encouraged to call if experiencing any fluid symptoms.   Follow-up plan: ICM clinic phone appointment on 01/30/2024.  91 day device clinic remote transmission 01/03/2024.       EP/Cardiology Office Visits:  Recall 09/29/2024 with Dr Kennyth.     Copy of ICM check sent to Dr. Kennyth.    3 month ICM trend: 12/26/2023.    12-14 Month ICM trend:     [name], RN 12/26/2023 4:03 PM

## 2023-12-26 NOTE — Telephone Encounter (Signed)
 Remote ICM transmission received.  Attempted call to patient regarding ICM remote transmission.  Left detailed message per DPR with ICM phone number to return call for any questions, concerns or fluid symptoms.

## 2024-01-03 ENCOUNTER — Ambulatory Visit: Payer: Medicare Other

## 2024-01-03 DIAGNOSIS — E782 Mixed hyperlipidemia: Secondary | ICD-10-CM | POA: Diagnosis not present

## 2024-01-03 DIAGNOSIS — I5022 Chronic systolic (congestive) heart failure: Secondary | ICD-10-CM

## 2024-01-03 DIAGNOSIS — N183 Chronic kidney disease, stage 3 unspecified: Secondary | ICD-10-CM | POA: Diagnosis not present

## 2024-01-03 DIAGNOSIS — E669 Obesity, unspecified: Secondary | ICD-10-CM | POA: Diagnosis not present

## 2024-01-05 ENCOUNTER — Ambulatory Visit: Payer: Self-pay | Admitting: Cardiology

## 2024-01-05 LAB — CUP PACEART REMOTE DEVICE CHECK
Battery Remaining Longevity: 53 mo
Battery Voltage: 2.98 V
Brady Statistic AP VP Percent: 50.57 %
Brady Statistic AP VS Percent: 0.76 %
Brady Statistic AS VP Percent: 46.94 %
Brady Statistic AS VS Percent: 1.73 %
Brady Statistic RA Percent Paced: 50.37 %
Brady Statistic RV Percent Paced: 6.58 %
Date Time Interrogation Session: 20250930022603
HighPow Impedance: 60 Ohm
HighPow Impedance: 74 Ohm
Implantable Lead Connection Status: 753985
Implantable Lead Connection Status: 753985
Implantable Lead Connection Status: 753985
Implantable Lead Implant Date: 20060323
Implantable Lead Implant Date: 20060323
Implantable Lead Implant Date: 20110420
Implantable Lead Location: 753858
Implantable Lead Location: 753859
Implantable Lead Location: 753860
Implantable Lead Model: 4193
Implantable Lead Model: 5076
Implantable Lead Model: 7121
Implantable Pulse Generator Implant Date: 20230626
Lead Channel Impedance Value: 304 Ohm
Lead Channel Impedance Value: 361 Ohm
Lead Channel Impedance Value: 399 Ohm
Lead Channel Impedance Value: 4047 Ohm
Lead Channel Impedance Value: 4047 Ohm
Lead Channel Impedance Value: 532 Ohm
Lead Channel Pacing Threshold Amplitude: 0.75 V
Lead Channel Pacing Threshold Amplitude: 1.5 V
Lead Channel Pacing Threshold Amplitude: 1.625 V
Lead Channel Pacing Threshold Pulse Width: 0.4 ms
Lead Channel Pacing Threshold Pulse Width: 0.4 ms
Lead Channel Pacing Threshold Pulse Width: 1 ms
Lead Channel Sensing Intrinsic Amplitude: 15.75 mV
Lead Channel Sensing Intrinsic Amplitude: 15.75 mV
Lead Channel Sensing Intrinsic Amplitude: 2.625 mV
Lead Channel Sensing Intrinsic Amplitude: 2.625 mV
Lead Channel Setting Pacing Amplitude: 1.5 V
Lead Channel Setting Pacing Amplitude: 2 V
Lead Channel Setting Pacing Amplitude: 2.5 V
Lead Channel Setting Pacing Pulse Width: 0.4 ms
Lead Channel Setting Pacing Pulse Width: 1 ms
Lead Channel Setting Sensing Sensitivity: 0.6 mV
Zone Setting Status: 755011

## 2024-01-05 NOTE — Progress Notes (Signed)
 Remote ICD Transmission

## 2024-01-10 DIAGNOSIS — Z23 Encounter for immunization: Secondary | ICD-10-CM | POA: Diagnosis not present

## 2024-01-11 DIAGNOSIS — R0981 Nasal congestion: Secondary | ICD-10-CM | POA: Diagnosis not present

## 2024-01-11 DIAGNOSIS — H6122 Impacted cerumen, left ear: Secondary | ICD-10-CM | POA: Diagnosis not present

## 2024-01-11 DIAGNOSIS — R42 Dizziness and giddiness: Secondary | ICD-10-CM | POA: Diagnosis not present

## 2024-01-11 DIAGNOSIS — H6502 Acute serous otitis media, left ear: Secondary | ICD-10-CM | POA: Diagnosis not present

## 2024-01-12 NOTE — Progress Notes (Signed)
 Remote ICD Transmission

## 2024-01-14 DIAGNOSIS — M25512 Pain in left shoulder: Secondary | ICD-10-CM | POA: Diagnosis not present

## 2024-01-17 ENCOUNTER — Other Ambulatory Visit: Payer: Self-pay | Admitting: Internal Medicine

## 2024-01-17 ENCOUNTER — Telehealth: Payer: Self-pay | Admitting: Cardiology

## 2024-01-17 MED ORDER — VALSARTAN 80 MG PO TABS: ORAL_TABLET | ORAL | 1 refills | Status: AC

## 2024-01-17 NOTE — Telephone Encounter (Signed)
 RX sent in

## 2024-01-17 NOTE — Telephone Encounter (Signed)
*  STAT* If patient is at the pharmacy, call can be transferred to refill team.   1. Which medications need to be refilled? (please list name of each medication and dose if known) valsartan  (DIOVAN ) 80 MG tablet   2. Which pharmacy/location (including street and city if local pharmacy) is medication to be sent to? [facility] [phone] - [place], [place] - [address]    3. Do they need a 30 day or 90 day supply?  90 day supply

## 2024-01-18 DIAGNOSIS — I130 Hypertensive heart and chronic kidney disease with heart failure and stage 1 through stage 4 chronic kidney disease, or unspecified chronic kidney disease: Secondary | ICD-10-CM | POA: Diagnosis not present

## 2024-01-18 DIAGNOSIS — N183 Chronic kidney disease, stage 3 unspecified: Secondary | ICD-10-CM | POA: Diagnosis not present

## 2024-01-18 DIAGNOSIS — I5022 Chronic systolic (congestive) heart failure: Secondary | ICD-10-CM | POA: Diagnosis not present

## 2024-01-18 DIAGNOSIS — E1122 Type 2 diabetes mellitus with diabetic chronic kidney disease: Secondary | ICD-10-CM | POA: Diagnosis not present

## 2024-01-19 DIAGNOSIS — R972 Elevated prostate specific antigen [PSA]: Secondary | ICD-10-CM | POA: Diagnosis not present

## 2024-01-26 DIAGNOSIS — R3915 Urgency of urination: Secondary | ICD-10-CM | POA: Diagnosis not present

## 2024-01-26 DIAGNOSIS — N411 Chronic prostatitis: Secondary | ICD-10-CM | POA: Diagnosis not present

## 2024-01-26 DIAGNOSIS — R351 Nocturia: Secondary | ICD-10-CM | POA: Diagnosis not present

## 2024-01-26 DIAGNOSIS — R972 Elevated prostate specific antigen [PSA]: Secondary | ICD-10-CM | POA: Diagnosis not present

## 2024-01-26 DIAGNOSIS — R350 Frequency of micturition: Secondary | ICD-10-CM | POA: Diagnosis not present

## 2024-01-26 DIAGNOSIS — N401 Enlarged prostate with lower urinary tract symptoms: Secondary | ICD-10-CM | POA: Diagnosis not present

## 2024-01-30 ENCOUNTER — Ambulatory Visit: Attending: Cardiology

## 2024-01-30 ENCOUNTER — Telehealth: Payer: Self-pay

## 2024-01-30 DIAGNOSIS — I5022 Chronic systolic (congestive) heart failure: Secondary | ICD-10-CM

## 2024-01-30 DIAGNOSIS — Z95810 Presence of automatic (implantable) cardiac defibrillator: Secondary | ICD-10-CM

## 2024-01-30 NOTE — Progress Notes (Signed)
 EPIC Encounter for ICM Monitoring  Patient Name: Russell Fuentes is a 73 y.o. male Date: 01/30/2024 Primary Care Physican: Loreli Kins, MD Primary Cardiologist: Kennyth Electrophysiologist: Kennyth Pore Pacing: 95.9%  (Effective 83.6%)        02/04/2023 Weight: 227 lbs 06/16/2023 Weight: 225 lbs  08/31/2023 Weight: 223 lbs 10/24/2023 Weight: 222 lbs   Time in AT/AF <0.1 hr/day (<0.1%)                                                Spoke with patient and heart failure questions reviewed.  Transmission results reviewed.  Pt asymptomatic for fluid accumulation.   Pt did have vertigo during decreased impedance and was told her had fluid in the ear.  He took antibiotic and resolved.      Diet:  09/06/2023 Discussed diet and he eats foods high in salt.  Typically eats restaurant foods 3 times a week.     Since 12/26/2023 ICM Remote Transmission: Optivol Thoracic impedance suggesting normal fluid levels with exception of possible fluid accumulation from 01/13/2024-01/15/2024.   Prescribed:  Furosemide  20 mg Take 1 tablet (20 mg total) by mouth every other day. Spironolactone  25 mg take 0.5 tablet (12.5 mg total) by mouth daily   Labs: 09/06/2022 Creatinine 1.36, BUN 18, Potassium 4.2, Sodium 140, GFR 55 A complete set of results can be found in Results Review.   Recommendations:  No changes and encouraged to call if experiencing any fluid symptoms.   Follow-up plan: ICM clinic phone appointment on 03/05/2024.  91 day device clinic remote transmission 04/03/2024.       EP/Cardiology Office Visits:  Recall 09/29/2024 with Dr Kennyth.     Copy of ICM check sent to Dr. Kennyth.    Remote monitoring is medically necessary for Heart Failure Management.    Daily Thoracic Impedance ICM trend: 10/31/2023 through 01/30/2024.    12-14 Month Thoracic Impedance ICM trend:     Russell GORMAN Garner, RN 01/30/2024 3:04 PM

## 2024-01-30 NOTE — Telephone Encounter (Signed)
 Attempted ICM Call and left message requesting to send manual remote transmission for monthly fluid level review.

## 2024-01-30 NOTE — Telephone Encounter (Signed)
 Returned pt call as requested assistance to send remote transmission.  Attempted to assist with sending remote transmission and unsuccessful.  Monitor showed error message.  Provided [employer] number to call for assistance.

## 2024-02-03 DIAGNOSIS — I130 Hypertensive heart and chronic kidney disease with heart failure and stage 1 through stage 4 chronic kidney disease, or unspecified chronic kidney disease: Secondary | ICD-10-CM | POA: Diagnosis not present

## 2024-02-03 DIAGNOSIS — E1122 Type 2 diabetes mellitus with diabetic chronic kidney disease: Secondary | ICD-10-CM | POA: Diagnosis not present

## 2024-02-03 DIAGNOSIS — E782 Mixed hyperlipidemia: Secondary | ICD-10-CM | POA: Diagnosis not present

## 2024-02-03 DIAGNOSIS — I5022 Chronic systolic (congestive) heart failure: Secondary | ICD-10-CM | POA: Diagnosis not present

## 2024-02-03 DIAGNOSIS — N183 Chronic kidney disease, stage 3 unspecified: Secondary | ICD-10-CM | POA: Diagnosis not present

## 2024-02-03 DIAGNOSIS — E669 Obesity, unspecified: Secondary | ICD-10-CM | POA: Diagnosis not present

## 2024-02-15 DIAGNOSIS — M25512 Pain in left shoulder: Secondary | ICD-10-CM | POA: Diagnosis not present

## 2024-02-17 DIAGNOSIS — I5022 Chronic systolic (congestive) heart failure: Secondary | ICD-10-CM | POA: Diagnosis not present

## 2024-02-17 DIAGNOSIS — E1122 Type 2 diabetes mellitus with diabetic chronic kidney disease: Secondary | ICD-10-CM | POA: Diagnosis not present

## 2024-02-17 DIAGNOSIS — N183 Chronic kidney disease, stage 3 unspecified: Secondary | ICD-10-CM | POA: Diagnosis not present

## 2024-02-17 DIAGNOSIS — I130 Hypertensive heart and chronic kidney disease with heart failure and stage 1 through stage 4 chronic kidney disease, or unspecified chronic kidney disease: Secondary | ICD-10-CM | POA: Diagnosis not present

## 2024-02-20 ENCOUNTER — Encounter: Payer: Self-pay | Admitting: Cardiology

## 2024-02-20 ENCOUNTER — Telehealth (HOSPITAL_BASED_OUTPATIENT_CLINIC_OR_DEPARTMENT_OTHER): Payer: Self-pay | Admitting: *Deleted

## 2024-02-20 ENCOUNTER — Telehealth (HOSPITAL_BASED_OUTPATIENT_CLINIC_OR_DEPARTMENT_OTHER): Payer: Self-pay

## 2024-02-20 NOTE — Telephone Encounter (Signed)
   Name: Russell Fuentes  DOB: 1950-12-31  MRN: [id number]  Primary Cardiologist: None   Preoperative team, please contact this patient and set up a phone call appointment for further preoperative risk assessment. Please obtain consent and complete medication review. Thank you for your help.  I confirm that guidance regarding antiplatelet and oral anticoagulation therapy has been completed and, if necessary, noted below.  None requested   I also confirmed the patient resides in the state of [place] . As per [facility] Medical Board telemedicine laws, the patient must reside in the state in which the provider is licensed.   Josefa CHRISTELLA Beauvais, NP 02/20/2024, 3:24 PM [facility]

## 2024-02-20 NOTE — Telephone Encounter (Signed)
 Pt has been scheduled tele preop appt 03/06/24. Med rec and consent are done.        Patient Consent for Virtual Visit        Russell Fuentes has provided verbal consent on 02/20/2024 for a virtual visit (video or telephone).   CONSENT FOR VIRTUAL VISIT FOR:  Russell Fuentes  By participating in this virtual visit I agree to the following:  I hereby voluntarily request, consent and authorize [facility] and its employed or contracted physicians, physician assistants, nurse practitioners or other licensed health care professionals (the Practitioner), to provide me with telemedicine health care services (the "Services) as deemed necessary by the treating Practitioner. I acknowledge and consent to receive the Services by the Practitioner via telemedicine. I understand that the telemedicine visit will involve communicating with the Practitioner through live audiovisual communication technology and the disclosure of certain medical information by electronic transmission. I acknowledge that I have been given the opportunity to request an in-person assessment or other available alternative prior to the telemedicine visit and am voluntarily participating in the telemedicine visit.  I understand that I have the right to withhold or withdraw my consent to the use of telemedicine in the course of my care at any time, without affecting my right to future care or treatment, and that the Practitioner or I may terminate the telemedicine visit at any time. I understand that I have the right to inspect all information obtained and/or recorded in the course of the telemedicine visit and may receive copies of available information for a reasonable fee.  I understand that some of the potential risks of receiving the Services via telemedicine include:  Delay or interruption in medical evaluation due to technological equipment failure or disruption; Information transmitted may not be sufficient (e.g. poor  resolution of images) to allow for appropriate medical decision making by the Practitioner; and/or  In rare instances, security protocols could fail, causing a breach of personal health information.  Furthermore, I acknowledge that it is my responsibility to provide information about my medical history, conditions and care that is complete and accurate to the best of my ability. I acknowledge that Practitioner's advice, recommendations, and/or decision may be based on factors not within their control, such as incomplete or inaccurate data provided by me or distortions of diagnostic images or specimens that may result from electronic transmissions. I understand that the practice of medicine is not an exact science and that Practitioner makes no warranties or guarantees regarding treatment outcomes. I acknowledge that a copy of this consent can be made available to me via my patient portal [facility] MyChart), or I can request a printed copy by calling the office of [facility].    I understand that my insurance will be billed for this visit.   I have read or had this consent read to me. I understand the contents of this consent, which adequately explains the benefits and risks of the Services being provided via telemedicine.  I have been provided ample opportunity to ask questions regarding this consent and the Services and have had my questions answered to my satisfaction. I give my informed consent for the services to be provided through the use of telemedicine in my medical care

## 2024-02-20 NOTE — Progress Notes (Signed)
 PERIOPERATIVE PRESCRIPTION FOR IMPLANTED CARDIAC DEVICE PROGRAMMING  Patient Information: Name:  Russell Fuentes  DOB:  1951/03/14  MRN:  [id number]  Procedure:  Left Shoulder Arthroscopy   Date of Surgery:  Clearance 03/16/24                                  Surgeon:  Dr. Sheril Surgeon's Group or Practice Name:  [facility] Phone number:  [phone] Fax number:  [phone]   Type of Clearance Requested:   - Medical    Type of Anesthesia:  Device Information:  Clinic EP Physician:  Fonda Kitty, MD  Device Type:  Defibrillator Manufacturer and Phone #:  Medtronic: [id number] Pacemaker Dependent?:  No. Date of Last Device Check:  01/30/24  Normal Device Function?:  Yes.    Electrophysiologist's Recommendations:  Have magnet available. Provide continuous ECG monitoring when magnet is used or reprogramming is to be performed.  Procedure may interfere with device function.  Magnet should be placed over device during procedure.  Per Device Clinic Standing Orders, Prentice JINNY Silvan, RN  3:41 PM 02/20/2024

## 2024-02-20 NOTE — Telephone Encounter (Signed)
 Pt has been scheduled tele pre op appt 04/27/23. Med rec and consent are done.

## 2024-02-20 NOTE — Telephone Encounter (Signed)
   Pre-operative Risk Assessment    Patient Name: Russell Fuentes  DOB: Sep 08, 1950 MRN: [id number]   Date of last office visit: 06/02/23 with Dr. Fernande Date of next office visit: 06/12/24 with Dr. Kennyth   Request for Surgical Clearance    Procedure:  Left Shoulder Arthroscopy  Date of Surgery:  Clearance 03/16/24                                 Surgeon:  Dr. Sheril Surgeon's Group or Practice Name:  [facility] Phone number:  [phone] Fax number:  [phone]   Type of Clearance Requested:   - Medical    Type of Anesthesia:     Additional requests/questions:  Medtronic   Signed, Augustin JONETTA Daring   02/20/2024, 2:41 PM

## 2024-03-02 DIAGNOSIS — R059 Cough, unspecified: Secondary | ICD-10-CM | POA: Diagnosis not present

## 2024-03-02 DIAGNOSIS — R0981 Nasal congestion: Secondary | ICD-10-CM | POA: Diagnosis not present

## 2024-03-02 DIAGNOSIS — R070 Pain in throat: Secondary | ICD-10-CM | POA: Diagnosis not present

## 2024-03-04 DIAGNOSIS — I5022 Chronic systolic (congestive) heart failure: Secondary | ICD-10-CM | POA: Diagnosis not present

## 2024-03-04 DIAGNOSIS — N183 Chronic kidney disease, stage 3 unspecified: Secondary | ICD-10-CM | POA: Diagnosis not present

## 2024-03-04 DIAGNOSIS — E669 Obesity, unspecified: Secondary | ICD-10-CM | POA: Diagnosis not present

## 2024-03-04 DIAGNOSIS — I130 Hypertensive heart and chronic kidney disease with heart failure and stage 1 through stage 4 chronic kidney disease, or unspecified chronic kidney disease: Secondary | ICD-10-CM | POA: Diagnosis not present

## 2024-03-04 DIAGNOSIS — E782 Mixed hyperlipidemia: Secondary | ICD-10-CM | POA: Diagnosis not present

## 2024-03-04 DIAGNOSIS — E1122 Type 2 diabetes mellitus with diabetic chronic kidney disease: Secondary | ICD-10-CM | POA: Diagnosis not present

## 2024-03-05 ENCOUNTER — Ambulatory Visit: Attending: Cardiology

## 2024-03-05 DIAGNOSIS — I5022 Chronic systolic (congestive) heart failure: Secondary | ICD-10-CM | POA: Diagnosis not present

## 2024-03-05 DIAGNOSIS — Z95810 Presence of automatic (implantable) cardiac defibrillator: Secondary | ICD-10-CM

## 2024-03-06 ENCOUNTER — Ambulatory Visit: Attending: Cardiology | Admitting: Nurse Practitioner

## 2024-03-06 DIAGNOSIS — Z01810 Encounter for preprocedural cardiovascular examination: Secondary | ICD-10-CM

## 2024-03-06 NOTE — Progress Notes (Signed)
 Virtual Visit via Telephone Note   Because of Russell Fuentes co-morbid illnesses, he is at least at moderate risk for complications without adequate follow up.  This format is felt to be most appropriate for this patient at this time.  Due to technical limitations with video connection (technology), today's appointment will be conducted as an audio only telehealth visit, and Russell Fuentes verbally agreed to proceed in this manner.   All issues noted in this document were discussed and addressed.  No physical exam could be performed with this format.  Evaluation Performed:  Preoperative cardiovascular risk assessment _____________   Date:  03/06/2024   Patient ID:  Russell Fuentes, DOB Jul 23, 1950, MRN [id number] Patient Location:  Home Provider location:   Office  Primary Care Provider:  Loreli Kins, MD Primary Cardiologist:  None  Chief Complaint / Patient Profile   73 y.o. y/o male with a h/o NICM s/p ICD, chronic diastolic heart failure, hypertension, and type 2 diabetes who is pending left shoulder arthroscopy on 03/16/2024 with Dr. Maude Herald of [facility] and presents today for telephonic preoperative cardiovascular risk assessment.  History of Present Illness    Russell Fuentes is a 73 y.o. male who presents via audio/video conferencing for a telehealth visit today.  Pt was last seen in cardiology clinic on 06/02/2023 by Dr. Fernande.  At that time Russell Fuentes was doing well.  The patient is now pending procedure as outlined above. Since his last visit, he has done well from a cardiac standpoint.  He denies chest pain, palpitations, dyspnea, pnd, orthopnea, n, v, dizziness, syncope, edema, weight gain, or early satiety. All other systems reviewed and are otherwise negative except as noted above.   Past Medical History    Past Medical History:  Diagnosis Date   CHF (congestive heart failure) (HCC)    Chronic combined systolic and diastolic heart failure (HCC)      Diabetes mellitus with neuropathy (HCC)     Hypertension    Nonischemic cardiomyopathy (HCC)     a. s/p MDT CRTD   Orthostatic lightheadedness     Past Surgical History:  Procedure Laterality Date   BIV ICD GENERATOR CHANGEOUT N/A 09/28/2021   Procedure: BIV ICD GENERATOR CHANGEOUT;  Surgeon: Fernande Elspeth BROCKS, MD;  Location: [facility] INVASIVE CV LAB;  Service: Cardiovascular;  Laterality: N/A;   CARDIAC CATHETERIZATION  12/31/1998   Bi-plane cine left ventriculography revealed low-normal left ventricular function with an ejection fraction in the 45% to 50% range.   CORONARY ARTERY BYPASS GRAFT     DOPPLER ECHOCARDIOGRAPHY  04/28/2011   LVEF 41% by simpson's borderline concentric LVH, global hypokinesis with regional variation. Stage 1 diastolic dysfunction, normal LV filling pressure. Normal LA size. Right heart pacing wires noted. Trace MR, TR. Normal RVSP   EP IMPLANTABLE DEVICE N/A 09/16/2014   Procedure: ICD Generator Changeout;  Surgeon: Elspeth BROCKS Fernande, MD;  Location: [facility] INVASIVE CV LAB;  Service: Cardiovascular;  Laterality: N/A;   LEFT HEART CATHETERIZATION WITH CORONARY ANGIOGRAM N/A 12/11/2013   Procedure: LEFT HEART CATHETERIZATION WITH CORONARY ANGIOGRAM;  Surgeon: Victory LELON Claudene DOUGLAS, MD;  Location: [facility] CATH LAB;  Service: Cardiovascular;  Laterality: N/A;   NM MYOCAR PERF EJECTION FRACTION  04/03/2009, 11/18/2005   The post stress myocardial perfusion images show a normal pattern of perfusion in all regions. The post-stress ejection fraction is 50%. No significant wall motion abnormalities noted. This is a low risk scan.    Allergies  Allergies  Allergen Reactions   Entresto  [Sacubitril -Valsartan ]     Fatigue, joint pain. Ok on valsartan  alone   Metformin Hcl Other (See Comments)   Codeine Rash    Home Medications    Prior to Admission medications   Medication Sig Start Date End Date Taking? Authorizing Provider  carvedilol  (COREG ) 12.5 MG tablet TAKE 1 TABLET(12.5 MG) BY MOUTH  TWICE DAILY 10/05/23   Fernande Elspeth BROCKS, MD  dapagliflozin propanediol (FARXIGA) 10 MG TABS tablet Take 10 mg by mouth daily. 11/10/20   [provider]  furosemide  (LASIX ) 20 MG tablet TAKE 1 TABLET BY MOUTH EVERY OTHER DAY 07/22/23   Fernande Elspeth BROCKS, MD  glimepiride (AMARYL) 4 MG tablet Take 4 mg by mouth 2 (two) times daily.    [provider]  lovastatin (MEVACOR) 20 MG tablet Take 20 mg by mouth at bedtime.    [provider]  spironolactone  (ALDACTONE ) 25 MG tablet TAKE 1/2 TABLET(12.5 MG) BY MOUTH DAILY 10/05/23   Fernande Elspeth BROCKS, MD  valsartan  (DIOVAN ) 80 MG tablet TAKE 1 TABLET(80 MG) BY MOUTH DAILY 01/17/24   Leverne Charlies Helling, PA-C    Physical Exam    Vital Signs:  Russell Fuentes does not have vital signs available for review today.  Given telephonic nature of communication, physical exam is limited. AAOx3. NAD. Normal affect.  Speech and respirations are unlabored.  Accessory Clinical Findings    None  Assessment & Plan    1.  Preoperative Cardiovascular Risk Assessment:  According to the Revised Cardiac Risk Index (RCRI), his Perioperative Risk of Major Cardiac Event is (%): 0.9. His Functional Capacity in METs is: 7.99 according to the Duke Activity Status Index (DASI). Therefore, based on ACC/AHA guidelines, patient would be at acceptable risk for the planned procedure without further cardiovascular testing. I will route this recommendation as well as device clearance to the requesting party via Epic fax function.  The patient was advised that if he develops new symptoms prior to surgery to contact our office to arrange for a follow-up visit, and he verbalized understanding.  A copy of this note will be routed to requesting surgeon.  Time:   Today, I have spent 7 minutes with the patient with telehealth technology discussing medical history, symptoms, and management plan.     Russell BROCKS Braver, NP  03/06/2024, 1:50 PM

## 2024-03-06 NOTE — Progress Notes (Signed)
 EPIC Encounter for ICM Monitoring  Patient Name: Russell Fuentes is a 73 y.o. male Date: 03/06/2024 Primary Care Physican: Loreli Kins, MD Primary Cardiologist: Kennyth Electrophysiologist: Kennyth Pore Pacing: 95.3%  (Effective 84.46%)        02/04/2023 Weight: 227 lbs 06/16/2023 Weight: 225 lbs  08/31/2023 Weight: 223 lbs 10/24/2023 Weight: 222 lbs 03/06/2024 Weight: 219 lbs   Time in AT/AF  0.0 hr/day (0.0%)                                                Spoke with patient and heart failure questions reviewed.  Transmission results reviewed.  Pt asymptomatic for fluid accumulation.  He is having shoulder surgery on 03/16/2024.   Diet:  N/A   Since 01/30/2024 ICM Remote Transmission: Optivol Thoracic impedance suggesting normal fluid levels with exception of possible fluid accumulation from 02/16/2024-02/22/2024 and 02/26/2024-02/28/2024.   Prescribed:  Furosemide  20 mg Take 1 tablet (20 mg total) by mouth every other day. Spironolactone  25 mg take 0.5 tablet (12.5 mg total) by mouth daily   Labs: 09/06/2022 Creatinine 1.36, BUN 18, Potassium 4.2, Sodium 140, GFR 55 A complete set of results can be found in Results Review.   Recommendations:  No changes and encouraged to call if experiencing any fluid symptoms.   Follow-up plan: ICM clinic phone appointment on 04/16/2024.  91 day device clinic remote transmission 04/03/2024.       EP/Cardiology Office Visits:  06/12/2024 with Dr Kennyth.     Copy of ICM check sent to Dr. Kennyth.     Remote monitoring is medically necessary for Heart Failure Management.    Daily Thoracic Impedance ICM trend: 12/05/2023 through 03/05/2024.    12-14 Month Thoracic Impedance ICM trend:     [name], RN 03/06/2024 8:51 AM

## 2024-03-21 ENCOUNTER — Other Ambulatory Visit: Payer: Self-pay | Admitting: Orthopedic Surgery

## 2024-04-03 ENCOUNTER — Ambulatory Visit: Payer: Medicare Other

## 2024-04-03 DIAGNOSIS — I5022 Chronic systolic (congestive) heart failure: Secondary | ICD-10-CM

## 2024-04-03 LAB — CUP PACEART REMOTE DEVICE CHECK
Battery Remaining Longevity: 49 mo
Battery Voltage: 2.98 V
Brady Statistic AP VP Percent: 49.49 %
Brady Statistic AP VS Percent: 0.73 %
Brady Statistic AS VP Percent: 48.3 %
Brady Statistic AS VS Percent: 1.49 %
Brady Statistic RA Percent Paced: 48.78 %
Brady Statistic RV Percent Paced: 3.94 %
Date Time Interrogation Session: 20251230033424
HighPow Impedance: 49 Ohm
HighPow Impedance: 63 Ohm
Implantable Lead Connection Status: 753985
Implantable Lead Connection Status: 753985
Implantable Lead Connection Status: 753985
Implantable Lead Implant Date: 20060323
Implantable Lead Implant Date: 20060323
Implantable Lead Implant Date: 20110420
Implantable Lead Location: 753858
Implantable Lead Location: 753859
Implantable Lead Location: 753860
Implantable Lead Model: 4193
Implantable Lead Model: 5076
Implantable Lead Model: 7121
Implantable Pulse Generator Implant Date: 20230626
Lead Channel Impedance Value: 285 Ohm
Lead Channel Impedance Value: 342 Ohm
Lead Channel Impedance Value: 361 Ohm
Lead Channel Impedance Value: 4047 Ohm
Lead Channel Impedance Value: 4047 Ohm
Lead Channel Impedance Value: 475 Ohm
Lead Channel Pacing Threshold Amplitude: 0.875 V
Lead Channel Pacing Threshold Amplitude: 0.875 V
Lead Channel Pacing Threshold Amplitude: 1.5 V
Lead Channel Pacing Threshold Pulse Width: 0.4 ms
Lead Channel Pacing Threshold Pulse Width: 0.4 ms
Lead Channel Pacing Threshold Pulse Width: 1 ms
Lead Channel Sensing Intrinsic Amplitude: 15.5 mV
Lead Channel Sensing Intrinsic Amplitude: 15.5 mV
Lead Channel Sensing Intrinsic Amplitude: 4.125 mV
Lead Channel Sensing Intrinsic Amplitude: 4.125 mV
Lead Channel Setting Pacing Amplitude: 1.5 V
Lead Channel Setting Pacing Amplitude: 2 V
Lead Channel Setting Pacing Amplitude: 2.5 V
Lead Channel Setting Pacing Pulse Width: 0.4 ms
Lead Channel Setting Pacing Pulse Width: 1 ms
Lead Channel Setting Sensing Sensitivity: 0.6 mV
Zone Setting Status: 755011

## 2024-04-07 ENCOUNTER — Ambulatory Visit: Payer: Self-pay | Admitting: Cardiology

## 2024-04-09 NOTE — Progress Notes (Signed)
 Remote ICD Transmission

## 2024-04-16 ENCOUNTER — Ambulatory Visit: Attending: Cardiology

## 2024-04-16 DIAGNOSIS — Z95810 Presence of automatic (implantable) cardiac defibrillator: Secondary | ICD-10-CM

## 2024-04-16 DIAGNOSIS — I5022 Chronic systolic (congestive) heart failure: Secondary | ICD-10-CM | POA: Diagnosis not present

## 2024-04-17 NOTE — Progress Notes (Signed)
 EPIC Encounter for ICM Monitoring  Patient Name: Russell Fuentes is a 74 y.o. male Date: 04/17/2024 Primary Care Physican: Loreli Kins, MD Primary Cardiologist: Kennyth Electrophysiologist: Kennyth Pore Pacing: 96.0%  (Effective 90.9%)        02/04/2023 Weight: 227 lbs 06/16/2023 Weight: 225 lbs  08/31/2023 Weight: 223 lbs 10/24/2023 Weight: 222 lbs 03/06/2024 Weight: 219 lbs   Time in AT/AF  <0.1 hr/day (<0.1%)                                                Spoke with patient and heart failure questions reviewed.  Transmission results reviewed.  Pt currently has mild leg swelling and some ear congestion.   He is having shoulder surgery on 05/10/2024.   Diet:  N/A   Since 03/05/2024 ICM Remote Transmission: Optivol Thoracic impedance suggesting possible fluid accumulation starting 04/10/2024.    Also possible fluid accumulation from 03/21/2024-04/02/2024.   Prescribed:  Furosemide  20 mg Take 1 tablet (20 mg total) by mouth every other day. Spironolactone  25 mg take 0.5 tablet (12.5 mg total) by mouth daily   Labs: 09/06/2022 Creatinine 1.36, BUN 18, Potassium 4.2, Sodium 140, GFR 55 A complete set of results can be found in Results Review.   Recommendations:  He will take extra lasix  x 1 dose and then return to every other day    Follow-up plan: ICM clinic phone appointment on 04/23/2024 to recheck fluid levels.  Next 31 day ICM Remote Transmission due 05/17/2024.   91 day device clinic remote transmission 07/03/2024.       EP/Cardiology Office Visits:  06/12/2024 with Dr Kennyth.     Copy of ICM check sent to Dr. Kennyth.     Remote monitoring is medically necessary for Heart Failure Management.    Daily Thoracic Impedance ICM trend: 01/16/2024 through 04/16/2024.    12-14 Month Thoracic Impedance ICM trend:     [name], RN 04/17/2024 8:20 AM

## 2024-04-23 ENCOUNTER — Ambulatory Visit: Attending: Cardiology

## 2024-04-23 DIAGNOSIS — I5022 Chronic systolic (congestive) heart failure: Secondary | ICD-10-CM | POA: Diagnosis not present

## 2024-04-23 DIAGNOSIS — Z95810 Presence of automatic (implantable) cardiac defibrillator: Secondary | ICD-10-CM

## 2024-04-23 NOTE — Progress Notes (Unsigned)
 EPIC Encounter for ICM Monitoring  Patient Name: Russell Fuentes is a 74 y.o. male Date: 04/23/2024 Primary Care Physican: Loreli Kins, MD Primary Cardiologist: Kennyth Electrophysiologist: Kennyth Pore Pacing: 97.2%  (Effective 89.2%)        02/04/2023 Weight: 227 lbs 06/16/2023 Weight: 225 lbs  08/31/2023 Weight: 223 lbs 10/24/2023 Weight: 222 lbs 03/06/2024 Weight: 219 lbs   Time in AT/AF  0.0 hr/day (0.0%)  Since 16-Apr-2024                                                Attempted call to patient and unable to reach.  Transmission results reviewed.    He is having shoulder surgery on 05/10/2024.   Diet:  N/A   Since 04/16/2024 ICM Remote Transmission: Optivol Thoracic impedance suggesting possible fluid accumulation continues after recommendation to take an extra Lasix .  Fluid accumulation started 04/10/2024.      Prescribed:  Furosemide  20 mg Take 1 tablet (20 mg total) by mouth every other day. Spironolactone  25 mg take 0.5 tablet (12.5 mg total) by mouth daily   Labs: 09/06/2022 Creatinine 1.36, BUN 18, Potassium 4.2, Sodium 140, GFR 55 A complete set of results can be found in Results Review.   Recommendations:  Unable to reach.     Follow-up plan: ICM clinic phone appointment on 04/30/2024 to recheck fluid levels (manual).  Next 31 day ICM Remote Transmission due 05/17/2024.   91 day device clinic remote transmission 07/03/2024.       EP/Cardiology Office Visits:  06/12/2024 with Dr Kennyth.     Copy of ICM check sent to Dr. Kennyth.      Remote monitoring is medically necessary for Heart Failure Management.    Daily Thoracic Impedance ICM trend: 01/23/2024 through 04/23/2024.    12-14 Month Thoracic Impedance ICM trend:     [name], RN 04/23/2024 9:47 AM

## 2024-04-24 ENCOUNTER — Telehealth: Payer: Self-pay

## 2024-04-24 NOTE — Progress Notes (Signed)
 Spoke with patient and heart failure questions reviewed.  Transmission results reviewed.  Pt reports he has been taking antibiotic for ear infection and he can tell he has fluid.  He is going to self adjust Lasix  20 mg and take 2 tablets x 2 days and then return to 20 mg every other day.

## 2024-04-24 NOTE — Telephone Encounter (Signed)
 Remote ICM transmission received.  Attempted call to patient regarding ICM remote transmission and no answer.

## 2024-04-30 ENCOUNTER — Ambulatory Visit

## 2024-05-02 NOTE — Patient Instructions (Signed)
 SURGICAL WAITING ROOM VISITATION Patients having surgery or a procedure may have no more than 2 support people in the waiting area - these visitors may rotate in the visitor waiting room.   If the patient needs to stay at the hospital during part of their recovery, the visitor guidelines for inpatient rooms apply.  PRE-OP VISITATION  Pre-op nurse will coordinate an appropriate time for 1 support person to accompany the patient in pre-op.  This support person may not rotate.  This visitor will be contacted when the time is appropriate for the visitor to come back in the pre-op area.  Temporary Visitor Restrictions   Children ages 24 and under will not be able to visit patients in [facility] under most circumstances. Visitation is not restricted outside of hospitals unless noted otherwise in the [facility] and Location Specific Visitation Guidelines at :       [email or web address].nixon.com/.  Visitors with respiratory illnesses are discouraged from visiting and should remain at home.  You are not required to quarantine at this time prior to your surgery. However, you must do this: Hand Hygiene often Do NOT share personal items Notify your provider if you are in close contact with someone who has COVID or you develop fever 100.4 or greater, new onset of sneezing, cough, sore throat, shortness of breath or body aches.  If you test positive for Covid or have been in contact with anyone that has tested positive in the last 10 days please notify you surgeon.    Your procedure is scheduled on:  THURSDAY  05-10-2024    Report to [facility] Main Entrance: [address] entrance where the [employer] is available.   Report to admitting at:  07:45   AM  Call this number if you have any questions or problems the morning of surgery [phone]  FOLLOW ANY ADDITIONAL PRE OP INSTRUCTIONS YOU RECEIVED FROM YOUR SURGEON'S OFFICE!!!  Do not eat food after Midnight the night prior to your  surgery/procedure.  After Midnight you may have the following liquids until  07:00  AM DAY OF SURGERY  Clear Liquid Diet Water Black Coffee (sugar ok, NO MILK/CREAM OR CREAMERS)  Tea (sugar ok, NO MILK/CREAM OR CREAMERS) regular and decaf                             Plain Jell-O  with no fruit (NO RED)                                           Fruit ices (not with fruit pulp, NO RED)                                     Popsicles (NO RED)                                                                  Juice: NO CITRUS JUICES: only apple, WHITE grape, WHITE cranberry Sports drinks like Gatorade or Powerade (NO RED)  The day of surgery:  Drink ONE (1) Pre-Surgery G2 at  07:00 AM the morning of surgery. Drink in one sitting. Do not sip.  This drink was given to you during your hospital pre-op appointment visit. Nothing else to drink after completing the Pre-Surgery G2 : No candy, chewing gum or throat lozenges.     Oral Hygiene is also important to reduce your risk of infection.        Remember - BRUSH YOUR TEETH THE MORNING OF SURGERY WITH YOUR REGULAR TOOTHPASTE  Do NOT smoke after Midnight the night before surgery.  STOP TAKING all Vitamins, Herbs and supplements 1 week before your surgery.   Dapagliflozin (FARXIGA)- Stop taking 72 hours before your surgery.  Last dose will be taken on SUNDAY 05-06-2024  Glimepiride (AMARYL)  Day BEFORE surgery; take only the morning and / or lunch time doses. Do not take at Bedtime.            DAY OF SURGERY: DO NOT TAKE Glimepiride.   Take ONLY these medicines the morning of surgery with A SIP OF WATER: Carvedilol  (COREG ),   DO NOT TAKE Spironolactone  (ALDACTONE ), Furosemide  (LASIX ), Valsartan  (DIOVAN ), on the day of surgery.                    You may not have any metal on your body including jewelry, and body piercing  Do not wear  lotions, powders, cologne, or deodorant  Men may shave face and neck.  Contacts, Hearing  Aids, dentures or bridgework may not be worn into surgery. DENTURES WILL BE REMOVED PRIOR TO SURGERY PLEASE DO NOT APPLY Poly grip OR ADHESIVES!!!  Patients discharged on the day of surgery will not be allowed to drive home.  Someone NEEDS to stay with you for the first 24 hours after anesthesia.  Do not bring your home medications to the hospital. The Pharmacy will dispense medications listed on your medication list to you during your admission in the Hospital.  Please read over the following fact sheets you were given: IF YOU HAVE QUESTIONS ABOUT YOUR PRE-OP INSTRUCTIONS, PLEASE CALL [phone]   [facility] - Preparing for Surgery           Before surgery, you can play an important role.  Because skin is not sterile, your skin needs to be as free of germs as possible.  You can reduce the number of germs on your skin by washing with CHG (chlorahexidine gluconate) soap before surgery.  CHG is an antiseptic cleaner which kills germs and bonds with the skin to continue killing germs even after washing. Please DO NOT use if you have an allergy to CHG or antibacterial soaps.  If your skin becomes reddened/irritated stop using the CHG and inform your nurse when you arrive at Short Stay. Do not shave (including legs and underarms) for at least 48 hours prior to the first CHG shower.  You may shave your face/neck.  Please follow these instructions carefully:  1.  Shower with CHG Soap the night before surgery ONLY (DO NOT USE THE CHG SOAP THE MORNING OF SURGERY).  2.  If you choose to wash your hair, wash your hair first as usual with your normal  shampoo.  3.  After you shampoo, rinse your hair and body thoroughly to remove the shampoo.                             4.  Use CHG as you would any other liquid soap.  You can apply chg directly to the skin and wash.  Gently with a scrungie or clean washcloth.  5.  Apply the CHG Soap to your body ONLY FROM THE NECK DOWN.   Do not use on face/ open                            Wound or open sores. Avoid contact with eyes, ears mouth and genitals (private parts).                       Wash face,  Genitals (private parts) with your normal soap.             6.  Wash thoroughly, paying special attention to the area where your  surgery  will be performed.  7.  Thoroughly rinse your body with warm water from the neck down.  8.  DO NOT shower/wash with your normal soap after using and rinsing off the CHG Soap.                9.  Pat yourself dry with a clean towel.            10.  Wear clean pajamas.            11.  Place clean sheets on your bed the night of your first shower and do not  sleep with pets.  Day of Surgery : Do not apply any CHG, lotions/deodorants the morning of surgery.  Please wear clean clothes to the hospital/surgery center.   FAILURE TO FOLLOW THESE INSTRUCTIONS MAY RESULT IN THE CANCELLATION OF YOUR SURGERY  PATIENT SIGNATURE_________________________________  NURSE SIGNATURE__________________________________  ________________________________________________________________________    Nasario Exon    An incentive spirometer is a tool that can help keep your lungs clear and active. This tool measures how well you are filling your lungs with each breath. Taking long deep breaths may help reverse or decrease the chance of developing breathing (pulmonary) problems (especially infection) following: A long period of time when you are unable to move or be active. BEFORE THE PROCEDURE  If the spirometer includes an indicator to show your best effort, your nurse or respiratory therapist will set it to a desired goal. If possible, sit up straight or lean slightly forward. Try not to slouch. Hold the incentive spirometer in an upright position. INSTRUCTIONS FOR USE  Sit on the edge of your bed if possible, or sit up as far as you can in bed or on a chair. Hold the incentive spirometer in an upright position. Breathe out  normally. Place the mouthpiece in your mouth and seal your lips tightly around it. Breathe in slowly and as deeply as possible, raising the piston or the ball toward the top of the column. Hold your breath for 3-5 seconds or for as long as possible. Allow the piston or ball to fall to the bottom of the column. Remove the mouthpiece from your mouth and breathe out normally. Rest for a few seconds and repeat Steps 1 through 7 at least 10 times every 1-2 hours when you are awake. Take your time and take a few normal breaths between deep breaths. The spirometer may include an indicator to show your best effort. Use the indicator as a goal to work toward during each repetition. After each set of 10 deep breaths, practice coughing to be sure your lungs  are clear. If you have an incision (the cut made at the time of surgery), support your incision when coughing by placing a pillow or rolled up towels firmly against it. Once you are able to get out of bed, walk around indoors and cough well. You may stop using the incentive spirometer when instructed by your caregiver.  RISKS AND COMPLICATIONS Take your time so you do not get dizzy or light-headed. If you are in pain, you may need to take or ask for pain medication before doing incentive spirometry. It is harder to take a deep breath if you are having pain. AFTER USE Rest and breathe slowly and easily. It can be helpful to keep track of a log of your progress. Your caregiver can provide you with a simple table to help with this. If you are using the spirometer at home, follow these instructions: SEEK MEDICAL CARE IF:  You are having difficultly using the spirometer. You have trouble using the spirometer as often as instructed. Your pain medication is not giving enough relief while using the spirometer. You develop fever of 100.5 F (38.1 C) or higher.                                                                                                     SEEK IMMEDIATE MEDICAL CARE IF:  You cough up bloody sputum that had not been present before. You develop fever of 102 F (38.9 C) or greater. You develop worsening pain at or near the incision site. MAKE SURE YOU:  Understand these instructions. Will watch your condition. Will get help right away if you are not doing well or get worse. Document Released: 08/02/2006 Document Revised: 06/14/2011 Document Reviewed: 10/03/2006 [facility] Patient Information 2014 [place], [place].

## 2024-05-02 NOTE — Progress Notes (Signed)
 Date of any COVID positive Test in last 90 days:  PCP - Joen Gentry, MD  Cardiologist - Carolan Sage, MD)  Chest x-ray - 09-14-2019  1v  Epic EKG - 06-02-2023  Epic  Stress Test -  ECHO -  Cardiac Cath - 12-11-2013  LHC / CORS by Dr. Victory Sharps CT Coronary Calcium score:  ZIO monitor-   Pacemaker / ICD device []  No [x]  Yes MERM   ICD CLARIA MRI [id number] - [id number] S  generator changeout 09-28-2021  by Dr. Sage  Last check: 04-03-2024 by [facility]  Spinal Cord Stimulator:[]  No []  Yes       History of Sleep Apnea? []  No []  Yes   CPAP used?- []  No []  Yes    Medication on DOS: Carvedilol  (COREG ),   Hold DOS:Spironolactone  (ALDACTONE ), Furosemide  (LASIX ), Valsartan  (DIOVAN ),   Patient has: []  NO Hx DM   []  Pre-DM   []  DM1  [x]   DM2 Does the patient monitor blood sugar?   []  N/A   []  No []  Yes  Last A1c was:   7.7 on 04-09-2024  at [facility]      Does patient have a [employer] or Dexcom? []  No []  Yes   Fasting Blood Sugar Ranges-  Checks Blood Sugar _____ times a day  Dapagliflozin (FARXIGA)- Hold x 72 hrs Glimepiride (AMARYL) - bid-  Hold DOS  Blood Thinner / Instructions:  none Aspirin  Instructions:  none  Activity level: Able to walk up 2 flights of stairs without becoming significantly short of breath or having chest pain?   []    Yes   []  No,  would have:  Patient can perform ADLs without assistance.  []   Yes    []  No   Comments:   Anesthesia review: HTN, S/p CABG,  NICM, CHF, uncontrolled DM2,  HOH-   Patient denies any S&S of respiratory illness or Covid - no shortness of breath, fever, cough or chest pain at PAT appointment.  Patient verbalized understanding and agreement to the Pre-Surgical Instructions that were given to them at this PAT appointment. Patient was also educated of the need to review these PAT instructions again prior to his surgery.I reviewed the appropriate phone numbers to call if they have any and questions or concerns.

## 2024-05-03 ENCOUNTER — Encounter: Payer: Self-pay | Admitting: Cardiology

## 2024-05-03 ENCOUNTER — Encounter (HOSPITAL_COMMUNITY)
Admission: RE | Admit: 2024-05-03 | Discharge: 2024-05-03 | Disposition: A | Discharge status: Home / Self Care | Source: Ambulatory Visit | Attending: Orthopedic Surgery | Admitting: Orthopedic Surgery

## 2024-05-03 ENCOUNTER — Other Ambulatory Visit: Payer: Self-pay

## 2024-05-03 ENCOUNTER — Encounter (HOSPITAL_COMMUNITY): Payer: Self-pay

## 2024-05-03 VITALS — BP 108/68 | HR 76 | Temp 97.9°F | Resp 18 | Ht 70.0 in | Wt 214.0 lb

## 2024-05-03 DIAGNOSIS — Z01818 Encounter for other preprocedural examination: Secondary | ICD-10-CM

## 2024-05-03 DIAGNOSIS — I5042 Chronic combined systolic (congestive) and diastolic (congestive) heart failure: Secondary | ICD-10-CM | POA: Diagnosis not present

## 2024-05-03 DIAGNOSIS — Z01812 Encounter for preprocedural laboratory examination: Secondary | ICD-10-CM | POA: Insufficient documentation

## 2024-05-03 DIAGNOSIS — Z7984 Long term (current) use of oral hypoglycemic drugs: Secondary | ICD-10-CM | POA: Insufficient documentation

## 2024-05-03 DIAGNOSIS — I252 Old myocardial infarction: Secondary | ICD-10-CM | POA: Diagnosis not present

## 2024-05-03 DIAGNOSIS — E1140 Type 2 diabetes mellitus with diabetic neuropathy, unspecified: Secondary | ICD-10-CM | POA: Insufficient documentation

## 2024-05-03 DIAGNOSIS — Z95810 Presence of automatic (implantable) cardiac defibrillator: Secondary | ICD-10-CM | POA: Diagnosis not present

## 2024-05-03 DIAGNOSIS — M75112 Incomplete rotator cuff tear or rupture of left shoulder, not specified as traumatic: Secondary | ICD-10-CM | POA: Diagnosis not present

## 2024-05-03 DIAGNOSIS — I110 Hypertensive heart disease with heart failure: Secondary | ICD-10-CM | POA: Diagnosis not present

## 2024-05-03 DIAGNOSIS — I428 Other cardiomyopathies: Secondary | ICD-10-CM | POA: Diagnosis not present

## 2024-05-03 LAB — BASIC METABOLIC PANEL WITH GFR
Anion gap: 7 (ref 5–15)
BUN: 12 mg/dL (ref 8–23)
CO2: 28 mmol/L (ref 22–32)
Calcium: 9.1 mg/dL (ref 8.9–10.3)
Chloride: 105 mmol/L (ref 98–111)
Creatinine, Ser: 1.17 mg/dL (ref 0.61–1.24)
GFR, Estimated: >60 mL/min
Glucose, Bld: 171 mg/dL — ABNORMAL HIGH (ref 70–99)
Potassium: 4.1 mmol/L (ref 3.5–5.1)
Sodium: 140 mmol/L (ref 135–145)

## 2024-05-03 LAB — CBC
HCT: 48.9 % (ref 39.0–52.0)
Hemoglobin: 15.9 g/dL (ref 13.0–17.0)
MCH: 30.1 pg (ref 26.0–34.0)
MCHC: 32.5 g/dL (ref 30.0–36.0)
MCV: 92.6 fL (ref 80.0–100.0)
Platelets: 160 10*3/uL (ref 150–400)
RBC: 5.28 MIL/uL (ref 4.22–5.81)
RDW: 13.3 % (ref 11.5–15.5)
WBC: 6.7 10*3/uL (ref 4.0–10.5)
nRBC: 0 % (ref 0.0–0.2)

## 2024-05-03 LAB — GLUCOSE, CAPILLARY: Glucose-Capillary: 194 mg/dL — ABNORMAL HIGH (ref 70–99)

## 2024-05-03 NOTE — Progress Notes (Signed)
 PERIOPERATIVE PRESCRIPTION FOR IMPLANTED CARDIAC DEVICE PROGRAMMING  Patient Information: Name:  Russell Fuentes  DOB:  1950/12/26  MRN:  [id number]  Planned Procedure:  Left shoulder arthroscopy, Rotator cuff repair, Debridement  Surgeon: Eva Herring, MD  Date of Procedure:  05-10-2024     0745 arrival time  Cautery will be used.  Position during surgery: Supine   Please send documentation back to:  [facility] (Fax# [phone])  Device Information:  Clinic EP Physician:  Dr. Fonda Kitty  Device Type:  Defibrillator Manufacturer and Phone #:  Medtronic: [phone] Pacemaker Dependent?:  No. Date of Last Device Check:  04/03/24  Remote Check  Normal Device Function?:  Yes.    Electrophysiologist's Recommendations:  Have magnet available. Provide continuous ECG monitoring when magnet is used or reprogramming is to be performed.  Procedure will likely interfere with device function.  Device should be programmed:  Tachy therapies disabled  Per Device Clinic Standing Orders, Prentice JINNY Silvan, RN  12:50 PM 05/03/2024

## 2024-05-03 NOTE — Progress Notes (Signed)
 31 day ICM Remote transmission canceled due to [facility] clinic is on hold until further notice.  91 day remote monitoring will continue per protocol.

## 2024-05-04 ENCOUNTER — Encounter (HOSPITAL_COMMUNITY): Payer: Self-pay

## 2024-05-04 NOTE — Progress Notes (Signed)
 " Case: [id number] Date/Time: 05/10/24 0951   Procedures:      ARTHROSCOPY, SHOULDER WITH DEBRIDEMENT (Left)     ARTHROSCOPY, SHOULDER, WITH ROTATOR CUFF REPAIR (Left)     DECOMPRESSION, SUBACROMIAL SPACE (Left)   Anesthesia type: Choice   Diagnosis: Incomplete tear of left rotator cuff, unspecified whether traumatic [M75.112]   Pre-op diagnosis: INCOMPLETE TEAR LEFT SHOULDER ROTATOR CUFF   Location: [facility] ROOM 08 / [facility]   Surgeons: Dozier Soulier, MD       DISCUSSION: Russell Fuentes is a 74 yo male with PMH of HTN, hx of MI, NICM s/p medtronic CRT-D, DM  (A1c 7.7), rotator cuff tear.  Patient follows with Cardiology for long standing hx of NICM s/p CRT-D originally in 2006. Generator replaced in 2016, 2023. Last Echo in 2023 showed LVEF 35-40%. Last seen by Dr. Fernande on 06/02/23 due to concerns for inadequate safety margins on LV capture. Device was reprogrammed. He was mildly volume overloaded and advised to take extra Lasix  for 3 days. He is on GDMT. Evaluated on 03/06/24 via tele visit for clearance by NP Monge. He denied any symptoms. Was cleared for surgery:  Preoperative Cardiovascular Risk Assessment:   According to the Revised Cardiac Risk Index (RCRI), his Perioperative Risk of Major Cardiac Event is (%): 0.9. His Functional Capacity in METs is: 7.99 according to the Duke Activity Status Index (DASI). Therefore, based on ACC/AHA guidelines, patient would be at acceptable risk for the planned procedure without further cardiovascular testing. I will route this recommendation as well as device clearance to the requesting party via Epic fax function.  Cardiology ICM phone clinic notes suggest patient may be volume overloaded. He was advised on 1/20 to take Lasix  20mg  2QD for 2 days and then return to normal dosing of Lasix  20mg  every other day. At PAT visit patient denies any symptoms of chest pain or worsening SOB.   Device orders 05/04/24:  Device Information:   Clinic EP  Physician:  Dr. Fonda Kitty   Device Type:  Defibrillator Manufacturer and Phone #:  Medtronic: [id number] Pacemaker Dependent?:  No. Date of Last Device Check:  04/03/24  Remote Check        Normal Device Function?:  Yes.     Electrophysiologist's Recommendations:   Have magnet available. Provide continuous ECG monitoring when magnet is used or reprogramming is to be performed.  Procedure will likely interfere with device function.  Device should be programmed:  Tachy therapies disabled   VS: BP 108/68 Comment: right arm sitting  Pulse 76   Temp 36.6 C (Oral)   Resp 18   Ht 5' 10 (1.778 m)   Wt 97.1 kg   SpO2 100%   BMI 30.71 kg/m   PROVIDERS: Loreli Kins, MD   LABS: Labs reviewed: Acceptable for surgery. (all labs ordered are listed, but only abnormal results are displayed)  Labs Reviewed  BASIC METABOLIC PANEL WITH GFR - Abnormal; Notable for the following components:      Result Value   Glucose, Bld 171 (*)    All other components within normal limits  GLUCOSE, CAPILLARY - Abnormal; Notable for the following components:   Glucose-Capillary 194 (*)    All other components within normal limits  CBC    Device check 04/03/24:  Remote Defibrillator interrogation reviewed. Presenting Rhythm:A-sensed V-paced. Battery and lead parameters stable with stable capture and sensing. Device programming is appropriate. No treated arrhythmias noted. Continue remote monitoring.  Echo 05/27/2021:  IMPRESSIONS    1.  Left ventricular ejection fraction, by estimation, is 35 to 40%. The left ventricle has moderately decreased function. The left ventricle demonstrates global hypokinesis. Left ventricular diastolic function could not be evaluated.  2. Right ventricular systolic function is mildly reduced. The right ventricular size is normal. Tricuspid regurgitation signal is inadequate for assessing PA pressure.  3. Left atrial size was moderately dilated.  4. The  mitral valve is normal in structure. No evidence of mitral valve regurgitation. No evidence of mitral stenosis.  5. The aortic valve is tricuspid. Aortic valve regurgitation is not visualized. No aortic stenosis is present.  6. The inferior vena cava is normal in size with greater than 50% respiratory variability, suggesting right atrial pressure of 3 mmHg.  Comparison(s): Prior images reviewed side by side. Changes from prior study are noted. EF similar but visually slightly decreased compared to prior study.  Conclusion(s)/Recommendation(s): Reduced LVEF with global hypokinesis.  Past Medical History:  Diagnosis Date   CHF (congestive heart failure) (HCC)    Chronic combined systolic and diastolic heart failure (HCC)     Diabetes mellitus with neuropathy (HCC)     Hypertension    Nonischemic cardiomyopathy (HCC)     a. s/p MDT CRTD   Orthostatic lightheadedness      Past Surgical History:  Procedure Laterality Date   APPENDECTOMY     BIV ICD GENERATOR CHANGEOUT N/A 09/28/2021   Procedure: BIV ICD GENERATOR CHANGEOUT;  Surgeon: Fernande Elspeth BROCKS, MD;  Location: [facility] INVASIVE CV LAB;  Service: Cardiovascular;  Laterality: N/A;   CARDIAC CATHETERIZATION  12/31/1998   Bi-plane cine left ventriculography revealed low-normal left ventricular function with an ejection fraction in the 45% to 50% range.   DOPPLER ECHOCARDIOGRAPHY  04/28/2011   LVEF 41% by simpson's borderline concentric LVH, global hypokinesis with regional variation. Stage 1 diastolic dysfunction, normal LV filling pressure. Normal LA size. Right heart pacing wires noted. Trace MR, TR. Normal RVSP   EP IMPLANTABLE DEVICE N/A 09/16/2014   Procedure: ICD Generator Changeout;  Surgeon: Elspeth BROCKS Fernande, MD;  Location: [facility] INVASIVE CV LAB;  Service: Cardiovascular;  Laterality: N/A;   EYE SURGERY Bilateral    Cataract extraction   LEFT HEART CATHETERIZATION WITH CORONARY ANGIOGRAM N/A 12/11/2013   Procedure: LEFT HEART  CATHETERIZATION WITH CORONARY ANGIOGRAM;  Surgeon: Victory LELON Claudene DOUGLAS, MD;  Location: [facility] CATH LAB;  Service: Cardiovascular;  Laterality: N/A;   NM MYOCAR PERF EJECTION FRACTION  04/03/2009, 11/18/2005   The post stress myocardial perfusion images show a normal pattern of perfusion in all regions. The post-stress ejection fraction is 50%. No significant wall motion abnormalities noted. This is a low risk scan.    MEDICATIONS:  carvedilol  (COREG ) 12.5 MG tablet   dapagliflozin propanediol (FARXIGA) 10 MG TABS tablet   furosemide  (LASIX ) 20 MG tablet   glimepiride (AMARYL) 4 MG tablet   lovastatin (MEVACOR) 20 MG tablet   spironolactone  (ALDACTONE ) 25 MG tablet   valsartan  (DIOVAN ) 80 MG tablet   No current facility-administered medications for this encounter.   Burnard CHRISTELLA Odis DEVONNA [facility] Surgical Short Stay/Anesthesiology [facility] Phone [phone] 05/04/2024 2:53 PM         "

## 2024-05-10 ENCOUNTER — Ambulatory Visit (HOSPITAL_COMMUNITY): Admitting: Anesthesiology

## 2024-05-10 ENCOUNTER — Other Ambulatory Visit: Payer: Self-pay

## 2024-05-10 ENCOUNTER — Ambulatory Visit (HOSPITAL_COMMUNITY)
Admission: RE | Admit: 2024-05-10 | Discharge: 2024-05-10 | Disposition: A | Discharge status: Home / Self Care | Source: Home / Self Care | Attending: Orthopedic Surgery | Admitting: Orthopedic Surgery

## 2024-05-10 ENCOUNTER — Encounter (HOSPITAL_COMMUNITY)
Admission: RE | Disposition: A | Discharge status: Home / Self Care | Payer: Self-pay | Source: Home / Self Care | Attending: Orthopedic Surgery

## 2024-05-10 ENCOUNTER — Encounter (HOSPITAL_COMMUNITY): Payer: Self-pay | Admitting: Medical

## 2024-05-10 ENCOUNTER — Encounter (HOSPITAL_COMMUNITY): Payer: Self-pay | Admitting: Orthopedic Surgery

## 2024-05-10 DIAGNOSIS — M75112 Incomplete rotator cuff tear or rupture of left shoulder, not specified as traumatic: Secondary | ICD-10-CM

## 2024-05-10 DIAGNOSIS — Z01818 Encounter for other preprocedural examination: Secondary | ICD-10-CM

## 2024-05-10 DIAGNOSIS — I5042 Chronic combined systolic (congestive) and diastolic (congestive) heart failure: Secondary | ICD-10-CM

## 2024-05-10 DIAGNOSIS — E1140 Type 2 diabetes mellitus with diabetic neuropathy, unspecified: Secondary | ICD-10-CM

## 2024-05-10 LAB — GLUCOSE, CAPILLARY
Glucose-Capillary: 193 mg/dL — ABNORMAL HIGH (ref 70–99)
Glucose-Capillary: 168 mg/dL — ABNORMAL HIGH (ref 70–99)

## 2024-05-10 MED ORDER — FENTANYL CITRATE (PF) 100 MCG/2ML IJ SOLN
INTRAMUSCULAR | Status: AC
  Filled 2024-05-10: qty 2

## 2024-05-10 MED ORDER — ORAL CARE MOUTH RINSE: 15.0000 mL | Freq: Once | OROMUCOSAL | Status: AC

## 2024-05-10 MED ORDER — MIDAZOLAM HCL (PF) 2 MG/2ML IJ SOLN
1.0000 mg | INTRAMUSCULAR | Status: DC
  Administered 2024-05-10: 2 mg via INTRAVENOUS
  Filled 2024-05-10: qty 2

## 2024-05-10 MED ORDER — OXYCODONE HCL 5 MG/5ML PO SOLN: 5.0000 mg | Freq: Once | ORAL | Status: DC | PRN

## 2024-05-10 MED ORDER — ROCURONIUM BROMIDE 10 MG/ML (PF) SYRINGE
PREFILLED_SYRINGE | INTRAVENOUS | Status: DC | PRN
  Administered 2024-05-10: 50 mg via INTRAVENOUS

## 2024-05-10 MED ORDER — ONDANSETRON HCL 4 MG/2ML IJ SOLN
INTRAMUSCULAR | Status: DC | PRN
  Administered 2024-05-10: 4 mg via INTRAVENOUS

## 2024-05-10 MED ORDER — OXYCODONE HCL 5 MG PO TABS: 5.0000 mg | ORAL_TABLET | Freq: Once | ORAL | Status: DC | PRN

## 2024-05-10 MED ORDER — ROCURONIUM BROMIDE 10 MG/ML (PF) SYRINGE
PREFILLED_SYRINGE | INTRAVENOUS | Status: AC
  Filled 2024-05-10: qty 10

## 2024-05-10 MED ORDER — PHENYLEPHRINE HCL-NACL 20-0.9 MG/250ML-% IV SOLN
INTRAVENOUS | Status: DC | PRN
  Administered 2024-05-10: 50 ug/min via INTRAVENOUS

## 2024-05-10 MED ORDER — PROPOFOL 10 MG/ML IV BOLUS
INTRAVENOUS | Status: DC | PRN
  Administered 2024-05-10: 30 mg via INTRAVENOUS
  Administered 2024-05-10: 80 mg via INTRAVENOUS

## 2024-05-10 MED ORDER — LIDOCAINE HCL (PF) 2 % IJ SOLN
INTRAMUSCULAR | Status: DC | PRN
  Administered 2024-05-10: 100 mg via INTRADERMAL

## 2024-05-10 MED ORDER — TIZANIDINE HCL 4 MG PO TABS: 4.0000 mg | ORAL_TABLET | Freq: Three times a day (TID) | ORAL | 0 refills | Status: AC | PRN

## 2024-05-10 MED ORDER — SODIUM CHLORIDE 0.9 % IR SOLN
Status: DC | PRN
  Administered 2024-05-10: 9000 mL

## 2024-05-10 MED ORDER — MEPERIDINE HCL 25 MG/ML IJ SOLN: 6.2500 mg | INTRAMUSCULAR | Status: DC | PRN

## 2024-05-10 MED ORDER — SUGAMMADEX SODIUM 200 MG/2ML IV SOLN
INTRAVENOUS | Status: DC | PRN
  Administered 2024-05-10: 200 mg via INTRAVENOUS

## 2024-05-10 MED ORDER — DEXAMETHASONE SOD PHOSPHATE PF 10 MG/ML IJ SOLN
INTRAMUSCULAR | Status: AC
  Filled 2024-05-10: qty 1

## 2024-05-10 MED ORDER — LACTATED RINGERS IV SOLN: INTRAVENOUS | Status: DC

## 2024-05-10 MED ORDER — CELECOXIB 200 MG PO CAPS
200.0000 mg | ORAL_CAPSULE | Freq: Once | ORAL | Status: AC
  Administered 2024-05-10: 200 mg via ORAL
  Filled 2024-05-10: qty 1

## 2024-05-10 MED ORDER — INSULIN ASPART 100 UNIT/ML IJ SOLN
0.0000 [IU] | INTRAMUSCULAR | Status: DC | PRN
  Administered 2024-05-10: 2 [IU] via SUBCUTANEOUS
  Filled 2024-05-10: qty 2

## 2024-05-10 MED ORDER — FENTANYL CITRATE (PF) 50 MCG/ML IJ SOSY
50.0000 ug | PREFILLED_SYRINGE | INTRAMUSCULAR | Status: DC
  Administered 2024-05-10: 50 ug via INTRAVENOUS
  Filled 2024-05-10: qty 2

## 2024-05-10 MED ORDER — 0.9 % SODIUM CHLORIDE (POUR BTL) OPTIME
TOPICAL | Status: DC | PRN
  Administered 2024-05-10: 1000 mL

## 2024-05-10 MED ORDER — CEFAZOLIN SODIUM-DEXTROSE 2-4 GM/100ML-% IV SOLN
2.0000 g | INTRAVENOUS | Status: AC
  Administered 2024-05-10: 2 g via INTRAVENOUS
  Filled 2024-05-10: qty 100

## 2024-05-10 MED ORDER — DEXAMETHASONE SOD PHOSPHATE PF 10 MG/ML IJ SOLN
INTRAMUSCULAR | Status: DC | PRN
  Administered 2024-05-10: 5 mg via INTRAVENOUS

## 2024-05-10 MED ORDER — ONDANSETRON HCL 4 MG/2ML IJ SOLN
INTRAMUSCULAR | Status: AC
  Filled 2024-05-10: qty 2

## 2024-05-10 MED ORDER — SUGAMMADEX SODIUM 200 MG/2ML IV SOLN
INTRAVENOUS | Status: AC
  Filled 2024-05-10: qty 2

## 2024-05-10 MED ORDER — FENTANYL CITRATE (PF) 100 MCG/2ML IJ SOLN
INTRAMUSCULAR | Status: DC | PRN
  Administered 2024-05-10 (×2): 25 ug via INTRAVENOUS

## 2024-05-10 MED ORDER — ONDANSETRON HCL 4 MG/2ML IJ SOLN: 4.0000 mg | Freq: Once | INTRAMUSCULAR | Status: DC | PRN

## 2024-05-10 MED ORDER — BUPIVACAINE HCL 0.5 % IJ SOLN
INTRAMUSCULAR | Status: DC | PRN
  Administered 2024-05-10: 10 mL

## 2024-05-10 MED ORDER — ACETAMINOPHEN 500 MG PO TABS
1000.0000 mg | ORAL_TABLET | Freq: Once | ORAL | Status: AC
  Administered 2024-05-10: 1000 mg via ORAL
  Filled 2024-05-10: qty 2

## 2024-05-10 MED ORDER — FENTANYL CITRATE (PF) 50 MCG/ML IJ SOSY: 25.0000 ug | PREFILLED_SYRINGE | INTRAMUSCULAR | Status: DC | PRN

## 2024-05-10 MED ORDER — POVIDONE-IODINE 7.5 % EX SOLN: 1.0000 | Freq: Once | CUTANEOUS | Status: DC

## 2024-05-10 MED ORDER — HYDROMORPHONE HCL 2 MG PO TABS: 2.0000 mg | ORAL_TABLET | ORAL | 0 refills | Status: AC | PRN

## 2024-05-10 MED ORDER — LIDOCAINE HCL (PF) 2 % IJ SOLN
INTRAMUSCULAR | Status: AC
  Filled 2024-05-10: qty 5

## 2024-05-10 MED ORDER — BUPIVACAINE LIPOSOME 1.3 % IJ SUSP
INTRAMUSCULAR | Status: DC | PRN
  Administered 2024-05-10: 10 mL via PERINEURAL

## 2024-05-10 MED ORDER — CHLORHEXIDINE GLUCONATE 0.12 % MT SOLN
15.0000 mL | Freq: Once | OROMUCOSAL | Status: AC
  Administered 2024-05-10: 15 mL via OROMUCOSAL

## 2024-05-10 MED ORDER — PROPOFOL 10 MG/ML IV BOLUS
INTRAVENOUS | Status: AC
  Filled 2024-05-10: qty 20

## 2024-05-10 NOTE — Anesthesia Postprocedure Evaluation (Signed)
"   Anesthesia Post Note  Patient: Russell Fuentes  Procedure(s) Performed: ARTHROSCOPY, SHOULDER WITH DEBRIDEMENT (Left) ARTHROSCOPY, SHOULDER, WITH ROTATOR CUFF REPAIR (Left) DECOMPRESSION, SUBACROMIAL SPACE (Left)     Patient location during evaluation: PACU Anesthesia Type: Regional Level of consciousness: awake and alert Pain management: pain level controlled Vital Signs Assessment: post-procedure vital signs reviewed and stable Respiratory status: spontaneous breathing, nonlabored ventilation, respiratory function stable and patient connected to nasal cannula oxygen Cardiovascular status: blood pressure returned to baseline and stable Postop Assessment: no apparent nausea or vomiting Anesthetic complications: no   No notable events documented.  Last Vitals:  Vitals:   05/10/24 1130 05/10/24 1137  BP: 106/64 119/68  Pulse: 72 70  Resp: 16 20  Temp: 36.4 C 36.8 C  SpO2: 96% 98%    Last Pain:  Vitals:   05/10/24 1137  TempSrc: Oral  PainSc: 0-No pain                 [name]      "

## 2024-05-10 NOTE — Transfer of Care (Signed)
 Immediate Anesthesia Transfer of Care Note  Patient: Russell Fuentes  Procedure(s) Performed: ARTHROSCOPY, SHOULDER WITH DEBRIDEMENT (Left) ARTHROSCOPY, SHOULDER, WITH ROTATOR CUFF REPAIR (Left) DECOMPRESSION, SUBACROMIAL SPACE (Left)  Patient Location: PACU  Anesthesia Type:General and Regional  Level of Consciousness: drowsy and patient cooperative  Airway & Oxygen Therapy: Patient Spontanous Breathing and Patient connected to face mask oxygen  Post-op Assessment: Report given to RN and Post -op Vital signs reviewed and stable  Post vital signs: Reviewed and stable  Last Vitals:  Vitals Value Taken Time  BP 122/69 05/10/24 10:53  Temp    Pulse 70 05/10/24 10:55  Resp 15 05/10/24 10:55  SpO2 100 % 05/10/24 10:55  Vitals shown include unfiled device data.  Last Pain:  Vitals:   05/10/24 0821  TempSrc:   PainSc: 0-No pain         Complications: No notable events documented.

## 2024-05-10 NOTE — H&P (Signed)
 FEDRICK CEFALU is an 74 y.o. male.   Chief Complaint: L shoulder pain HPI: L shoulder partial RCT with impingment, failed conservative treatment.  Past Medical History:  Diagnosis Date   CHF (congestive heart failure) (HCC)    Chronic combined systolic and diastolic heart failure (HCC)     Diabetes mellitus with neuropathy (HCC)     Hypertension    Nonischemic cardiomyopathy (HCC)     a. s/p MDT CRTD   Orthostatic lightheadedness      Past Surgical History:  Procedure Laterality Date   APPENDECTOMY     BIV ICD GENERATOR CHANGEOUT N/A 09/28/2021   Procedure: BIV ICD GENERATOR CHANGEOUT;  Surgeon: Fernande Elspeth BROCKS, MD;  Location: [facility] INVASIVE CV LAB;  Service: Cardiovascular;  Laterality: N/A;   CARDIAC CATHETERIZATION  12/31/1998   Bi-plane cine left ventriculography revealed low-normal left ventricular function with an ejection fraction in the 45% to 50% range.   DOPPLER ECHOCARDIOGRAPHY  04/28/2011   LVEF 41% by simpson's borderline concentric LVH, global hypokinesis with regional variation. Stage 1 diastolic dysfunction, normal LV filling pressure. Normal LA size. Right heart pacing wires noted. Trace MR, TR. Normal RVSP   EP IMPLANTABLE DEVICE N/A 09/16/2014   Procedure: ICD Generator Changeout;  Surgeon: Elspeth BROCKS Fernande, MD;  Location: [facility] INVASIVE CV LAB;  Service: Cardiovascular;  Laterality: N/A;   EYE SURGERY Bilateral    Cataract extraction   LEFT HEART CATHETERIZATION WITH CORONARY ANGIOGRAM N/A 12/11/2013   Procedure: LEFT HEART CATHETERIZATION WITH CORONARY ANGIOGRAM;  Surgeon: Victory LELON Claudene DOUGLAS, MD;  Location: [facility] CATH LAB;  Service: Cardiovascular;  Laterality: N/A;   NM MYOCAR PERF EJECTION FRACTION  04/03/2009, 11/18/2005   The post stress myocardial perfusion images show a normal pattern of perfusion in all regions. The post-stress ejection fraction is 50%. No significant wall motion abnormalities noted. This is a low risk scan.    Family History  Problem Relation Age  of Onset   Asthma Sister    Asthma Brother    Heart attack Father    Heart attack Sister    Cancer Mother    Heart attack Mother    Cancer Sister    Hypertension Brother    Social History:  reports that he has never smoked. He has never used smokeless tobacco. He reports that he does not drink alcohol and does not use drugs.  Allergies: Allergies[1]  Medications Prior to Admission  Medication Sig Dispense Refill   carvedilol  (COREG ) 12.5 MG tablet TAKE 1 TABLET(12.5 MG) BY MOUTH TWICE DAILY 180 tablet 3   dapagliflozin propanediol (FARXIGA) 10 MG TABS tablet Take 10 mg by mouth daily.     furosemide  (LASIX ) 20 MG tablet TAKE 1 TABLET BY MOUTH EVERY OTHER DAY (Patient taking differently: Take 20 mg by mouth daily.) 45 tablet 3   glimepiride (AMARYL) 4 MG tablet Take 4 mg by mouth 2 (two) times daily.     lovastatin (MEVACOR) 20 MG tablet Take 20 mg by mouth at bedtime.     spironolactone  (ALDACTONE ) 25 MG tablet TAKE 1/2 TABLET(12.5 MG) BY MOUTH DAILY 45 tablet 3   valsartan  (DIOVAN ) 80 MG tablet TAKE 1 TABLET(80 MG) BY MOUTH DAILY (Patient taking differently: Take 80 mg by mouth at bedtime.) 90 tablet 1    Results for orders placed or performed during the hospital encounter of 05/10/24 (from the past 48 hours)  Glucose, capillary     Status: Abnormal   Collection Time: 05/10/24  8:27 AM  Result  Value Ref Range   Glucose-Capillary 193 (H) 70 - 99 mg/dL    Comment: Glucose reference range applies only to samples taken after fasting for at least 8 hours.   Comment 1 Notify RN    Comment 2 Document in Chart    No results found.  Review of Systems  All other systems reviewed and are negative.   Blood pressure 118/82, pulse 87, temperature 98.4 F (36.9 C), temperature source Oral, resp. rate 15, height 5' 10 (1.778 m), weight 97 kg, SpO2 98%. Physical Exam Constitutional:      Appearance: He is well-developed.  HENT:     Head: Atraumatic.  Eyes:     Extraocular Movements:  Extraocular movements intact.  Cardiovascular:     Pulses: Normal pulses.  Pulmonary:     Effort: Pulmonary effort is normal.  Musculoskeletal:     Comments: L shoulder pain with RC testing.  Skin:    General: Skin is warm and dry.  Neurological:     Mental Status: He is alert and oriented to person, place, and time.  Psychiatric:        Mood and Affect: Mood normal.      Assessment/Plan L shoulder partial RCT with impingment, failed conservative treatment. Plan L shoulder RCR vs RCD, SAD Risks / benefits of surgery discussed Consent on chart  NPO for OR Preop antibiotics   Josefa LELON Herring, MD 05/10/2024, 8:36 AM       [1]  Allergies Allergen Reactions   Entresto  [Sacubitril -Valsartan ]     Fatigue, joint pain. Ok on valsartan  alone   Metformin Hcl Other (See Comments)   Codeine Rash

## 2024-05-10 NOTE — Anesthesia Procedure Notes (Signed)
 Procedure Name: Intubation Date/Time: 05/10/2024 9:38 AM  Performed by: Franchot Delon RAMAN, CRNAPre-anesthesia Checklist: Patient identified, Emergency Drugs available, Suction available and Patient being monitored Patient Re-evaluated:Patient Re-evaluated prior to induction Oxygen Delivery Method: Circle System Utilized Preoxygenation: Pre-oxygenation with 100% oxygen Induction Type: IV induction Ventilation: Mask ventilation without difficulty Laryngoscope Size: Mac and 4 Grade View: Grade I Tube type: Oral Tube size: 7.5 mm Number of attempts: 1 Airway Equipment and Method: Stylet Placement Confirmation: ETT inserted through vocal cords under direct vision, positive ETCO2 and breath sounds checked- equal and bilateral Secured at: 22 cm Tube secured with: Tape Dental Injury: Teeth and Oropharynx as per pre-operative assessment

## 2024-05-10 NOTE — Discharge Instructions (Addendum)
Discharge Instructions after Arthroscopic Shoulder Repair ° ° °A sling has been provided for you. Remain in your sling at all times. This includes sleeping in your sling.  °Use ice on the shoulder intermittently over the first 48 hours after surgery.  °Pain medicine has been prescribed for you.  °Use your medicine liberally over the first 48 hours, and then you can begin to taper your use. You may take Extra Strength Tylenol or Tylenol only in place of the pain pills. DO NOT take ANY nonsteroidal anti-inflammatory pain medications: Advil, Motrin, Ibuprofen, Aleve, Naproxen, or Narprosyn.  °You may remove your dressing after two days. If the incision sites are still moist, place a Band-Aid over the moist site(s). Change Band-Aids daily until dry.  °You may shower 5 days after surgery. The incisions CANNOT get wet prior to 5 days. Simply allow the water to wash over the site and then pat dry. Do not rub the incisions. Make sure your axilla (armpit) is completely dry after showering.  °Take one aspirin a day for 2 weeks after surgery, unless you have an aspirin sensitivity/ allergy or asthma. ° ° °Please call [phone] during normal business hours or [phone] after hours for any problems. Including the following: ° °- excessive redness of the incisions °- drainage for more than 4 days °- fever of more than 101.5 F ° °*Please note that pain medications will not be refilled after hours or on weekends. ° ° ° °

## 2024-05-10 NOTE — Op Note (Signed)
 Procedures: ARTHROSCOPY, SHOULDER WITH DEBRIDEMENT ARTHROSCOPY, SHOULDER, WITH ROTATOR CUFF REPAIR DECOMPRESSION, SUBACROMIAL SPACE Procedure Note  Russell Fuentes male 74 y.o. 05/10/2024  Preoperative diagnosis: #1 right shoulder high-grade partial rotator cuff tear #2 right shoulder impingement with unfavorable acromial anatomy  Postoperative diagnosis: Same  Procedure performed: #1 right shoulder arthroscopic rotator cuff repair #2 right shoulder arthroscopic subacromial decompression  Surgeons and Role:    DEWAINE Fuentes Soulier, MD - Primary     Surgeon: Russell Fuentes   Assistants: Jeoffrey Northern PA-C Amber was present and scrubbed throughout the procedure and was essential in positioning, assisting with the camera and instrumentation,, and closure)  Anesthesia: General endotracheal anesthesia with preoperative interscalene block given by the attending anesthesiologist    Procedure Detail  ARTHROSCOPY, SHOULDER WITH DEBRIDEMENT, ARTHROSCOPY, SHOULDER, WITH ROTATOR CUFF REPAIR, DECOMPRESSION, SUBACROMIAL SPACE  Estimated Blood Loss: Min         Drains: none  Blood Given: none         Specimens: none        Complications:  * No complications entered in OR log *         Disposition: PACU - hemodynamically stable.         Condition: stable    Procedure:   INDICATIONS FOR SURGERY: The patient is 74 y.o. male who has a long history of left shoulder pain which has failed conservative management.  Found by ultrasound to have high-grade partial rotator cuff tear.  Indicated for surgical treatment to try and decrease pain and restore function.  OPERATIVE FINDINGS: Examination under anesthesia: No stiffness or instability   DESCRIPTION OF PROCEDURE: The patient was identified in preoperative  holding area where I personally marked the operative site after  verifying site, side, and procedure with the patient. An interscalene block was given by the attending  anesthesiologist the holding area.  The patient was taken back to the operating room where general anesthesia was induced without complication and was placed in the beach-chair position with the back  elevated about 60 degrees and all extremities and head and neck carefully padded and  positioned.   The left upper extremity was then prepped and  draped in a standard sterile fashion. The appropriate time-out  procedure was carried out. The patient did receive IV antibiotics  within 30 minutes of incision.   A small posterior portal incision was made and the arthroscope was introduced into the joint. An anterior portal was then established above the subscapularis using needle localization. Small cannula was placed anteriorly. Diagnostic arthroscopy was then carried out   The subscapularis was noted to be intact.  The biceps and superior labrum were intact.  Glenohumeral joint surfaces were intact without significant chondromalacia.  From the undersurface there was no clear full-thickness tear of the supraspinatus or infraspinatus.  The arthroscope was then introduced into the subacromial space a standard lateral portal was established with needle localization. The shaver was used through the lateral portal to perform extensive bursectomy. Coracoacromial ligament was examined and found to be frayed indicating chronic impingement.  There was a deep bursal sided partial tear of the entire supraspinatus insertion involving about 90% of the insertion site.  The decision was made to proceed with repair.  The tendon edge was debrided back to healthy tendon.  The tuberosity was debrided down to a bleeding bony surface with the bur to promote healing.  The repair was then carried out by first placing 2 4.75 peek swivel lock anchors preloaded with  fiber tape just off the articular margin.  These 4 suture strands were then passed evenly throughout the tear and brought over in a crossing suture bridge pattern to  2 additional swivel lock anchors.  This brought the tendon down nicely over the prepared tuberosity.  The knotless suture mechanism from the lateral row anchors were used to add additional fixation on the repair.  The final repair was felt to be anatomic and watertight.  The coracoacromial ligament was taken down off the anterior acromion with the ArthroCare exposing a moderate hooked anterior acromial spur. A high-speed bur was then used through the lateral portal to take down the anterior acromial spur from lateral to medial in a standard acromioplasty.  The acromioplasty was also viewed from the lateral portal and the bur was used as necessary to ensure that the acromion was completely flat from posterior to anterior.   The arthroscopic equipment was removed from the joint and the portals were closed with 3-0 nylon in an interrupted fashion. Sterile dressings were then applied including Xeroform 4 x 4's ABDs and tape. The patient was then allowed to awaken from general anesthesia, placed in a sling, transferred to the stretcher and taken to the recovery room in stable condition.   POSTOPERATIVE PLAN: The patient will be discharged home today and will followup in one week for suture removal and wound check.  He will follow the standard cuff protocol.

## 2024-05-10 NOTE — Anesthesia Preprocedure Evaluation (Addendum)
 "                                  Anesthesia Evaluation  Patient identified by MRN, date of birth, ID band Patient awake    Reviewed: Allergy & Precautions, H&P , NPO status , Patient's Chart, lab work & pertinent test results  Airway Mallampati: II  TM Distance: >3 FB Neck ROM: Full    Dental no notable dental hx. (+) Teeth Intact, Dental Advisory Given   Pulmonary neg pulmonary ROS   Pulmonary exam normal breath sounds clear to auscultation       Cardiovascular Exercise Tolerance: Good hypertension, Pt. on medications + angina  +CHF  negative cardio ROS Normal cardiovascular exam Rhythm:Regular Rate:Normal     Neuro/Psych negative neurological ROS  negative psych ROS   GI/Hepatic negative GI ROS, Neg liver ROS,,,  Endo/Other  negative endocrine ROSdiabetes, Type 2    Renal/GU negative Renal ROS  negative genitourinary   Musculoskeletal negative musculoskeletal ROS (+)    Abdominal   Peds negative pediatric ROS (+)  Hematology negative hematology ROS (+)   Anesthesia Other Findings   Reproductive/Obstetrics negative OB ROS                              Anesthesia Physical Anesthesia Plan  ASA: 4  Anesthesia Plan: General and Regional   Post-op Pain Management: Regional block*   Induction: Intravenous  PONV Risk Score and Plan: 2 and Ondansetron , Dexamethasone  and Treatment may vary due to age or medical condition  Airway Management Planned: Oral ETT  Additional Equipment: None  Intra-op Plan:   Post-operative Plan: Extubation in OR  Informed Consent: I have reviewed the patients History and Physical, chart, labs and discussed the procedure including the risks, benefits and alternatives for the proposed anesthesia with the patient or authorized representative who has indicated his/her understanding and acceptance.     Dental advisory given  Plan Discussed with: Anesthesiologist and CRNA  Anesthesia  Plan Comments: (Discussed both nerve block for pain relief post-op and GA; including NV, sore throat, dental injury, and pulmonary complications  DISCUSSION: Russell Fuentes is a 74 yo male with PMH of HTN, hx of MI, NICM s/p medtronic CRT-D, DM  (A1c 7.7), rotator cuff tear.   Patient follows with Cardiology for long standing hx of NICM s/p CRT-D originally in 2006. Generator replaced in 2016, 2023. Last Echo in 2023 showed LVEF 35-40%. Last seen by Dr. Fernande on 06/02/23 due to concerns for inadequate safety margins on LV capture. Device was reprogrammed. He was mildly volume overloaded and advised to take extra Lasix  for 3 days. He is on GDMT. Evaluated on 03/06/24 via tele visit for clearance by NP Monge. He denied any symptoms. Was cleared for surgery:   Preoperative Cardiovascular Risk Assessment:   According to the Revised Cardiac Risk Index (RCRI), his Perioperative Risk of Major Cardiac Event is (%): 0.9. His Functional Capacity in METs is: 7.99 according to the Duke Activity Status Index (DASI). Therefore, based on ACC/AHA guidelines, patient would be at acceptable risk for the planned procedure without further cardiovascular testing. I will route this recommendation as well as device clearance to the requesting party via Epic fax function.   Cardiology ICM phone clinic notes suggest patient may be volume overloaded. He was advised on 1/20 to take Lasix  20mg  2QD for 2 days and  then return to normal dosing of Lasix  20mg  every other day. At PAT visit patient denies any symptoms of chest pain or worsening SOB.     Device orders 05/04/24:   Device Information:   Clinic EP Physician:  Dr. Fonda Kitty   Device Type:  Defibrillator Manufacturer and Phone #:  Medtronic: [phone] Pacemaker Dependent?:  No. Date of Last Device Check:  04/03/24  Remote Check        Normal Device Function?:  Yes.     Electrophysiologist's Recommendations:   Have magnet available. Provide continuous  ECG monitoring when magnet is used or reprogramming is to be performed.  Procedure will likely interfere with device function.  Device should be programmed:  Tachy therapies disabled  Device check 04/03/24:   Remote Defibrillator interrogation reviewed. Presenting Rhythm:A-sensed V-paced. Battery and lead parameters stable with stable capture and sensing. Device programming is appropriate. No treated arrhythmias noted. Continue remote monitoring.   Echo 05/27/2021:   IMPRESSIONS      1. Left ventricular ejection fraction, by estimation, is 35 to 40%. The left ventricle has moderately decreased function. The left ventricle demonstrates global hypokinesis. Left ventricular diastolic function could not be evaluated.  2. Right ventricular systolic function is mildly reduced. The right ventricular size is normal. Tricuspid regurgitation signal is inadequate for assessing PA pressure.  3. Left atrial size was moderately dilated.  4. The mitral valve is normal in structure. No evidence of mitral valve regurgitation. No evidence of mitral stenosis.  5. The aortic valve is tricuspid. Aortic valve regurgitation is not visualized. No aortic stenosis is present.  6. The inferior vena cava is normal in size with greater than 50% respiratory variability, suggesting right atrial pressure of 3 mmHg.   Comparison(s): Prior images reviewed side by side. Changes from prior study are noted. EF similar but visually slightly decreased compared to prior study.   Conclusion(s)/Recommendation(s): Reduced LVEF with global hypokinesis.  )         Anesthesia Quick Evaluation  "

## 2024-05-10 NOTE — Anesthesia Procedure Notes (Addendum)
 Anesthesia Regional Block: Interscalene brachial plexus block   Pre-Anesthetic Checklist: , timeout performed,  Correct Patient, Correct Site, Correct Laterality,  Correct Procedure, Correct Position, site marked,  Risks and benefits discussed,  Surgical consent,  Pre-op evaluation,  At surgeon's request and post-op pain management  Laterality: Left  Prep: chloraprep       Needles:  Injection technique: Single-shot  Needle Type: Echogenic Stimulator Needle     Needle Length: 5cm  Needle Gauge: 22     Additional Needles:   Procedures:, nerve stimulator,,, ultrasound used (permanent image in chart),,     Nerve Stimulator or Paresthesia:  Response: hand, 0.45 mA  Additional Responses:   Narrative:  Start time: 05/10/2024 9:10 AM End time: 05/10/2024 9:16 AM Injection made incrementally with aspirations every 5 mL.  Performed by: Personally  Anesthesiologist: Mallory Manus, MD  Additional Notes: Functioning IV was confirmed and monitors were applied.  A 50mm 22ga Arrow echogenic stimulator needle was used. Sterile prep and drape,hand hygiene and sterile gloves were used. Ultrasound guidance: relevant anatomy identified, needle position confirmed, local anesthetic spread visualized around nerve(s)., vascular puncture avoided.  Image printed for medical record. Negative aspiration and negative test dose prior to incremental administration of local anesthetic. The patient tolerated the procedure well.

## 2024-05-11 ENCOUNTER — Encounter (HOSPITAL_COMMUNITY): Payer: Self-pay | Admitting: Orthopedic Surgery

## 2024-06-12 ENCOUNTER — Ambulatory Visit: Admitting: Cardiology

## 2024-07-03 ENCOUNTER — Ambulatory Visit

## 2024-10-02 ENCOUNTER — Ambulatory Visit

## 2025-01-01 ENCOUNTER — Ambulatory Visit

## 2025-04-02 ENCOUNTER — Ambulatory Visit

## 2025-07-02 ENCOUNTER — Ambulatory Visit
